# Patient Record
Sex: Female | Born: 1975 | Race: White | Hispanic: No | Marital: Single | Smoking: Current every day smoker
Health system: Southern US, Community
[De-identification: ages and names within clinical notes are randomized; demographics above are authoritative.]

## PROBLEM LIST (undated history)

## (undated) DIAGNOSIS — J069 Acute upper respiratory infection, unspecified: Secondary | ICD-10-CM

## (undated) DIAGNOSIS — F419 Anxiety disorder, unspecified: Secondary | ICD-10-CM

## (undated) DIAGNOSIS — R87629 Unspecified abnormal cytological findings in specimens from vagina: Secondary | ICD-10-CM

## (undated) DIAGNOSIS — J329 Chronic sinusitis, unspecified: Secondary | ICD-10-CM

## (undated) DIAGNOSIS — K219 Gastro-esophageal reflux disease without esophagitis: Secondary | ICD-10-CM

## (undated) DIAGNOSIS — N390 Urinary tract infection, site not specified: Secondary | ICD-10-CM

## (undated) DIAGNOSIS — N200 Calculus of kidney: Secondary | ICD-10-CM

## (undated) HISTORY — DX: Unspecified abnormal cytological findings in specimens from vagina: R87.629

## (undated) HISTORY — DX: Gastro-esophageal reflux disease without esophagitis: K21.9

## (undated) HISTORY — PX: TUBAL LIGATION: SHX77

## (undated) HISTORY — PX: DILATION AND CURETTAGE OF UTERUS: SHX78

---

## 2002-03-30 ENCOUNTER — Encounter: Payer: Self-pay | Admitting: Emergency Medicine

## 2002-03-30 ENCOUNTER — Emergency Department (HOSPITAL_COMMUNITY): Admission: EM | Admit: 2002-03-30 | Discharge: 2002-03-30 | Payer: Self-pay | Admitting: Emergency Medicine

## 2002-12-15 ENCOUNTER — Encounter: Admission: RE | Admit: 2002-12-15 | Discharge: 2002-12-15 | Payer: Self-pay | Admitting: Family Medicine

## 2003-05-30 ENCOUNTER — Emergency Department (HOSPITAL_COMMUNITY): Admission: EM | Admit: 2003-05-30 | Discharge: 2003-05-31 | Payer: Self-pay | Admitting: Emergency Medicine

## 2003-06-25 ENCOUNTER — Encounter: Payer: Self-pay | Admitting: Emergency Medicine

## 2003-06-26 ENCOUNTER — Inpatient Hospital Stay (HOSPITAL_COMMUNITY): Admission: AD | Admit: 2003-06-26 | Discharge: 2003-06-27 | Payer: Self-pay | Admitting: Obstetrics and Gynecology

## 2003-11-06 ENCOUNTER — Emergency Department (HOSPITAL_COMMUNITY): Admission: EM | Admit: 2003-11-06 | Discharge: 2003-11-06 | Payer: Self-pay | Admitting: Emergency Medicine

## 2003-11-13 ENCOUNTER — Inpatient Hospital Stay (HOSPITAL_COMMUNITY): Admission: AD | Admit: 2003-11-13 | Discharge: 2003-11-14 | Payer: Self-pay | Admitting: Obstetrics & Gynecology

## 2003-11-29 ENCOUNTER — Ambulatory Visit (HOSPITAL_COMMUNITY): Admission: AD | Admit: 2003-11-29 | Discharge: 2003-11-29 | Payer: Self-pay | Admitting: Obstetrics & Gynecology

## 2003-11-29 ENCOUNTER — Encounter (INDEPENDENT_AMBULATORY_CARE_PROVIDER_SITE_OTHER): Payer: Self-pay | Admitting: *Deleted

## 2004-06-10 ENCOUNTER — Emergency Department (HOSPITAL_COMMUNITY): Admission: EM | Admit: 2004-06-10 | Discharge: 2004-06-10 | Payer: Self-pay | Admitting: Emergency Medicine

## 2004-07-01 ENCOUNTER — Emergency Department (HOSPITAL_COMMUNITY): Admission: EM | Admit: 2004-07-01 | Discharge: 2004-07-01 | Payer: Self-pay | Admitting: Emergency Medicine

## 2006-03-10 ENCOUNTER — Emergency Department (HOSPITAL_COMMUNITY): Admission: EM | Admit: 2006-03-10 | Discharge: 2006-03-10 | Payer: Self-pay | Admitting: Emergency Medicine

## 2008-09-02 ENCOUNTER — Inpatient Hospital Stay (HOSPITAL_COMMUNITY): Admission: AD | Admit: 2008-09-02 | Discharge: 2008-09-02 | Payer: Self-pay | Admitting: Obstetrics & Gynecology

## 2010-06-24 ENCOUNTER — Ambulatory Visit: Payer: Self-pay | Admitting: Gynecology

## 2010-06-24 ENCOUNTER — Inpatient Hospital Stay (HOSPITAL_COMMUNITY): Admission: AD | Admit: 2010-06-24 | Discharge: 2010-06-24 | Payer: Self-pay | Admitting: Obstetrics & Gynecology

## 2010-08-01 ENCOUNTER — Ambulatory Visit (HOSPITAL_COMMUNITY): Admission: RE | Admit: 2010-08-01 | Discharge: 2010-08-01 | Payer: Self-pay | Admitting: Family Medicine

## 2010-10-03 ENCOUNTER — Ambulatory Visit (HOSPITAL_COMMUNITY): Admission: RE | Admit: 2010-10-03 | Discharge: 2010-10-03 | Payer: Self-pay | Admitting: Family Medicine

## 2011-01-19 ENCOUNTER — Encounter: Payer: Self-pay | Admitting: Family Medicine

## 2011-01-27 ENCOUNTER — Encounter: Payer: Self-pay | Admitting: Family Medicine

## 2011-01-27 ENCOUNTER — Inpatient Hospital Stay (HOSPITAL_COMMUNITY)
Admission: AD | Admit: 2011-01-27 | Discharge: 2011-01-27 | Payer: Self-pay | Source: Home / Self Care | Attending: Family Medicine | Admitting: Family Medicine

## 2011-01-27 LAB — URINALYSIS, ROUTINE W REFLEX MICROSCOPIC
Bilirubin Urine: NEGATIVE
Ketones, ur: NEGATIVE mg/dL
Leukocytes, UA: NEGATIVE
Nitrite: NEGATIVE
Protein, ur: NEGATIVE mg/dL
Specific Gravity, Urine: 1.025 (ref 1.005–1.030)
Urine Glucose, Fasting: NEGATIVE mg/dL
Urobilinogen, UA: 0.2 mg/dL (ref 0.0–1.0)
pH: 6 (ref 5.0–8.0)

## 2011-01-27 LAB — URINE MICROSCOPIC-ADD ON

## 2011-01-29 LAB — URINE CULTURE
Colony Count: >=100000
Culture  Setup Time: 201201310321

## 2011-02-24 ENCOUNTER — Other Ambulatory Visit: Payer: Self-pay | Admitting: Family Medicine

## 2011-02-24 DIAGNOSIS — Z8759 Personal history of other complications of pregnancy, childbirth and the puerperium: Secondary | ICD-10-CM

## 2011-02-25 ENCOUNTER — Inpatient Hospital Stay (HOSPITAL_COMMUNITY)
Admission: AD | Admit: 2011-02-25 | Discharge: 2011-02-27 | DRG: 775 | Disposition: A | Discharge status: Home / Self Care | Payer: Medicaid Other | Source: Ambulatory Visit | Attending: Obstetrics & Gynecology | Admitting: Obstetrics & Gynecology

## 2011-02-25 DIAGNOSIS — Z22330 Carrier of Group B streptococcus: Secondary | ICD-10-CM

## 2011-02-25 DIAGNOSIS — O4290 Premature rupture of membranes, unspecified as to length of time between rupture and onset of labor, unspecified weeks of gestation: Secondary | ICD-10-CM

## 2011-02-25 DIAGNOSIS — O99892 Other specified diseases and conditions complicating childbirth: Secondary | ICD-10-CM | POA: Diagnosis present

## 2011-02-25 DIAGNOSIS — O9989 Other specified diseases and conditions complicating pregnancy, childbirth and the puerperium: Secondary | ICD-10-CM

## 2011-02-25 LAB — CBC
HCT: 35.1 % — ABNORMAL LOW (ref 36.0–46.0)
Hemoglobin: 12.1 g/dL (ref 12.0–15.0)
MCH: 29.1 pg (ref 26.0–34.0)
MCHC: 34.5 g/dL (ref 30.0–36.0)
MCV: 84.4 fL (ref 78.0–100.0)
Platelets: 287 10*3/uL (ref 150–400)
RBC: 4.16 MIL/uL (ref 3.87–5.11)
RDW: 14 % (ref 11.5–15.5)
WBC: 12 10*3/uL — ABNORMAL HIGH (ref 4.0–10.5)

## 2011-02-25 LAB — RPR: RPR Ser Ql: NONREACTIVE

## 2011-02-26 LAB — CBC
HCT: 30.8 % — ABNORMAL LOW (ref 36.0–46.0)
Hemoglobin: 10.2 g/dL — ABNORMAL LOW (ref 12.0–15.0)
MCH: 28.1 pg (ref 26.0–34.0)
MCHC: 33.1 g/dL (ref 30.0–36.0)
MCV: 84.8 fL (ref 78.0–100.0)
Platelets: 257 10*3/uL (ref 150–400)
RBC: 3.63 MIL/uL — ABNORMAL LOW (ref 3.87–5.11)
RDW: 13.8 % (ref 11.5–15.5)
WBC: 12.9 10*3/uL — ABNORMAL HIGH (ref 4.0–10.5)

## 2011-02-27 ENCOUNTER — Ambulatory Visit (HOSPITAL_COMMUNITY): Payer: Medicaid Other

## 2011-03-16 LAB — POCT PREGNANCY, URINE: Preg Test, Ur: POSITIVE

## 2011-03-28 ENCOUNTER — Ambulatory Visit (INDEPENDENT_AMBULATORY_CARE_PROVIDER_SITE_OTHER): Payer: Medicaid Other | Admitting: Obstetrics & Gynecology

## 2011-03-28 DIAGNOSIS — Z3009 Encounter for other general counseling and advice on contraception: Secondary | ICD-10-CM

## 2011-03-28 NOTE — H&P (Signed)
NAME:  Jacqueline [name], BELITZ NO.:  [id number]  MEDICAL RECORD NO.:  [id number]           PATIENT TYPE:  A  LOCATION:  [facility]                   FACILITY:  [facility]  PHYSICIAN:  Scheryl Darter, MD       DATE OF BIRTH:  13-May-1976  DATE OF SERVICE:                          PRE-OP HISTORY & PHYSICAL  The patient wants to have sterilization procedure.  The patient is a 35- year-old white female gravida 4, para 2-1-1-3, who had a vaginal delivery of an 8-pound-11-ounce female at 16 weeks 3 days' gestation on February 25, 2011.  The patient desires sterilization, but this was not done during hospitalization due to timing of the procedure.  She now wants to schedule laparoscopic sterilization procedure.  She understands this is permanent sterilization, but there is a risk of failure or ectopic of about 1:200.  The risks of anesthesia, complications, bleeding, infection, bowel or urinary tract damage was also discussed. Questions were answered.  She wants to schedule this as an outpatient.  PAST MEDICAL HISTORY:  Anemia.  PAST GYNECOLOGIC HISTORY:  The patient had an ectopic pregnancy in 2006. She had a D and C, but no abdominal procedures.  She has had abnormal Pap smear with L-SIL that was investigated with a colposcopy during her pregnancy.  She is for followup Pap smears postpartum.  FAMILY HISTORY:  Cancer and heart disease.  SOCIAL HISTORY:  The patient smokes 2 cigarettes a day.  MEDICATIONS:  None.  ALLERGIES:  PENICILLIN which causes a rash.  REVIEW OF SYSTEMS:  No bleeding.  She received Depo-Provera in the hospital.  No complaints of abnormal discharge or pain.  PHYSICAL EXAMINATION:  GENERAL:  The patient is in no acute distress. Affect is normal. VITAL SIGNS:  Weight 149 pounds, height 60 inches, blood pressure 106/73, pulse 71, temperature 98.1. ABDOMEN:  Soft and nontender.  No mass. EXTERNAL GENITALIA:  Vagina and cervix are normal.  Uterus normal  in size.  No adnexal masses or tenderness.  IMPRESSION: 1. Undesired fertility. 2. History of low-grade squamous intraepithelial lesion Pap status     post colposcopy during pregnancy.  PLAN:  The patient will be scheduled for laparoscopic bilateral tubal ligation.  Medicaid papers were signed and are __________.     Scheryl Darter, MD    JA/MEDQ  D:  03/28/2011  T:  03/28/2011  Job:  [id number]

## 2011-04-28 ENCOUNTER — Ambulatory Visit (HOSPITAL_COMMUNITY)
Admission: RE | Admit: 2011-04-28 | Discharge: 2011-04-28 | Disposition: A | Discharge status: Home / Self Care | Payer: Medicaid Other | Source: Ambulatory Visit | Attending: Obstetrics & Gynecology | Admitting: Obstetrics & Gynecology

## 2011-04-28 DIAGNOSIS — Z302 Encounter for sterilization: Secondary | ICD-10-CM | POA: Insufficient documentation

## 2011-04-28 LAB — CBC
HCT: 38.1 % (ref 36.0–46.0)
MCH: 28.1 pg (ref 26.0–34.0)
MCV: 83.6 fL (ref 78.0–100.0)
Platelets: 341 10*3/uL (ref 150–400)
RBC: 4.56 MIL/uL (ref 3.87–5.11)

## 2011-04-28 LAB — PREGNANCY, URINE: Preg Test, Ur: NEGATIVE

## 2011-04-29 NOTE — Op Note (Signed)
  NAME:  Jacqueline [name], Jacqueline [name] NO.:  [id number]  MEDICAL RECORD NO.:  [id number]           PATIENT TYPE:  O  LOCATION:  [facility]                          [facility]  PHYSICIAN:  Scheryl Darter, MD       DATE OF BIRTH:  16-Jun-1976  DATE OF PROCEDURE: DATE OF DISCHARGE:                              OPERATIVE REPORT   PROCEDURE:  Laparoscopic bilateral tubal ligation.  PREOPERATIVE DIAGNOSIS:  Undesired fertility.  POSTOPERATIVE DIAGNOSIS:  Undesired fertility.  SURGEON:  Scheryl Darter, MD.  ANESTHESIA:  General.  ESTIMATED BLOOD LOSS:  Minimal.  SPECIMENS:  None.  COMPLICATIONS:  None.  COUNTS:  Correct.  OPERATIVE COURSE:  The patient gave written consent for laparoscopic tubal sterilization procedure.  The patient identification was confirmed.  She was brought to the OR and general anesthesia was induced.  She was placed in dorsal lithotomy position.  Abdomen, perineum and vagina were sterilely prepped and draped.  A Hulka tenaculum was placed on the cervix.  A #11 blade was used to make a transverse infraumbilical skin incision about 1.5 cm across.  Abdominal wall was elevated and Veress needle was placed.  CO2 was insufflated. Needle was then removed.  An 11-mm trocar was then placed in the incision.  Laparoscope was inserted with video camera in use.  View of the pelvis was obtained.  Uterus, tubes and ovaries appeared normal.  Uterus was manipulated with a Hulka tenaculum.  Filshie clip was applied to the right fallopian tube with good positioning seen. Same was done on the left side.  All instruments were removed and pneumoperitoneum was released.  A 0 Vicryl suture was placed in the fascia at the umbilicus.  Skin was closed with interrupted subcuticular sutures of 4-0 Vicryl and a sterile dressing was applied.  The patient tolerated the procedure well without complications.  Estimated blood loss was negligible.  She was brought in stable condition to  the recovery room.     Scheryl Darter, MD     JA/MEDQ  D:  04/28/2011  T:  04/29/2011  Job:  [id number]  Electronically Signed by Scheryl Darter MD on 04/29/2011 11:06:22 AM

## 2011-05-16 NOTE — Discharge Summary (Signed)
NAME:  Jacqueline [name], Jacqueline [name]                            ACCOUNT NO.:  [id number]   MEDICAL RECORD NO.:  [id number]                   PATIENT TYPE:  INP   LOCATION:  9125                                 [facility]   PHYSICIAN:  Zenaida Niece, M.D.             DATE OF BIRTH:  1976-10-10   DATE OF ADMISSION:  06/25/2003  DATE OF DISCHARGE:  06/27/2003                                 DISCHARGE SUMMARY   ADMISSION DIAGNOSES:  1. Early pregnancy.  2. Right pyelonephritis.  3. Vaginal Candidiasis.   PROCEDURES:  None.   HISTORY AND PHYSICAL:  This is a 35 year old white female, gravida 4, para 2-  0-1-2, with a last menstrual period at the beginning of June, who presented  to [facility] ED with the complaint of right flank pain and dysuria as well  as hematuria, nausea, vomiting, and constipation.  She denied fever.  The  lab findings at [facility] were consistent with the UTI and she was  transferred to [facility] for evaluation of __________, or early pregnancy and  pyelonephritis.   PAST OB HISTORY:  Two vaginal deliveries and one __________.   MEDICAL HISTORY:  History of pyelonephritis and UTI.   SOCIAL HISTORY:  She does smoke a half pack of cigarettes a day.   PHYSICAL EXAM:  She is afebrile.  She has stable vital signs.  GENERAL:  She is in mild distress.  LUNGS:  Clear to auscultation.  HEART:  Regular rate and rhythm without murmur.  BACK:  She has positive right CVA tenderness.  ABDOMEN:  Soft.  Mild tenderness in the right lower quadrant and right flank  without mass.   ADMISSION LABS:  Blood type is O positive.  CBC with a white count of 14.9,  hemoglobin13.5, platelets 114,000.  Quantitative hCG of 200.  Creatinine  0.7.  UA with too numerous to count white blood cells and red blood cells  with positive yeast.  Pelvic ultrasound with a normal pelvis.   HOSPITAL COURSE:  The patient was admitted due to the fact that she was  unable to tolerate p.o.  She was placed on  IV Ancef.  She remained afebrile.  She was unable to tolerate adequate p.o. until the evening of June 28th.  White count remained stable at 14.1, hemoglobin fell to 11.9 with hydration,  and platelet count increased to 293.  On the morning of June 29th, she had  remained afebrile and was able to tolerate p.o. and was felt stable enough  for discharge home.  Urine culture returned E. coli that was pansensitive.   DISCHARGE INSTRUCTIONS:  1. Regular diet.  2. Follow up in approximately 10 days.    MEDICATIONS:  1. Keflex 500 mg, #24, 1 p.o. t.i.d. for eight days.  She is also to use     five days of Terazol 7.  Zenaida Niece, M.D.    TDM/MEDQ  D:  06/27/2003  T:  06/27/2003  Job:  [id number]

## 2011-05-16 NOTE — Op Note (Signed)
NAME:  Jacqueline [name], Jacqueline [name]                           ACCOUNT NO.:  [id number]   MEDICAL RECORD NO.:  [id number]                   PATIENT TYPE:  AMB   LOCATION:  [facility]                                 [facility]   PHYSICIAN:  Phil D. Okey Dupre, M.D.                  DATE OF BIRTH:  Jul 03, 1976   DATE OF PROCEDURE:  11/29/2003  DATE OF DISCHARGE:                                 OPERATIVE REPORT   PREOPERATIVE DIAGNOSIS:  Missed abortion.   POSTOPERATIVE DIAGNOSIS:  Missed abortion.   PROCEDURE:  Dilatation and evacuation.   SURGEON:  Javier Glazier. Okey Dupre, M.D.   ASSISTANT:  Bradly Bienenstock, M.D.   ESTIMATED BLOOD LOSS:  10 mL.   ANESTHESIA:  MAC plus local.   FINDINGS:  Uterus anterior flexed, approximately 8-weeks size.  Freely  movable with normal free adnexa.   DESCRIPTION OF PROCEDURE:  Under satisfactory MAC sedation with the patient  in the dorsal lithotomy position, the perineum and vagina prepped and draped  in the usual sterile manner.  Bimanual pelvic examination was as  aforementioned.  Weighted speculum was placed to the posterior fourchette of  the vagina.  Anterior lip of the cervix was grasped with a single-tooth  tenaculum.  Paracervical block was done, injecting 10 mL of 1% Xylocaine in  each of two areas at 9 and 3 o'clock of the paracervical area.  Uterine  cavity was then sounded to 8 cm.  Cervical os dilated to a #8 Hegar dilator.  The uterine cavity evacuated with a #8 curved curette without incident.  Minimal bleeding during the procedure.  Tenaculum and speculum removed from  the vagina.  The patient transferred to the recovery room in satisfactory  condition having tolerated the procedure well.                                               Phil D. Okey Dupre, M.D.    PDR/MEDQ  D:  11/29/2003  T:  11/30/2003  Job:  [id number]

## 2011-10-01 LAB — URINALYSIS, ROUTINE W REFLEX MICROSCOPIC
Glucose, UA: NEGATIVE
Ketones, ur: NEGATIVE
Leukocytes, UA: NEGATIVE
Specific Gravity, Urine: 1.02
pH: 7

## 2011-10-01 LAB — CBC
HCT: 39.7
Hemoglobin: 13.1
MCHC: 33.1
MCV: 92.3
RBC: 4.3
WBC: 10.5

## 2011-10-01 LAB — URINE CULTURE

## 2011-10-01 LAB — WET PREP, GENITAL
Trich, Wet Prep: NONE SEEN
Yeast Wet Prep HPF POC: NONE SEEN

## 2011-10-01 LAB — URINE MICROSCOPIC-ADD ON: RBC / HPF: NONE SEEN

## 2011-10-01 LAB — GC/CHLAMYDIA PROBE AMP, GENITAL: Chlamydia, DNA Probe: NEGATIVE

## 2011-11-25 ENCOUNTER — Emergency Department (HOSPITAL_COMMUNITY)
Admission: EM | Admit: 2011-11-25 | Discharge: 2011-11-25 | Disposition: A | Discharge status: Home / Self Care | Payer: Self-pay | Attending: Emergency Medicine | Admitting: Emergency Medicine

## 2011-11-25 ENCOUNTER — Encounter: Payer: Self-pay | Admitting: Emergency Medicine

## 2011-11-25 DIAGNOSIS — R112 Nausea with vomiting, unspecified: Secondary | ICD-10-CM | POA: Insufficient documentation

## 2011-11-25 DIAGNOSIS — R197 Diarrhea, unspecified: Secondary | ICD-10-CM | POA: Insufficient documentation

## 2011-11-25 DIAGNOSIS — Z9851 Tubal ligation status: Secondary | ICD-10-CM | POA: Insufficient documentation

## 2011-11-25 DIAGNOSIS — F17200 Nicotine dependence, unspecified, uncomplicated: Secondary | ICD-10-CM | POA: Insufficient documentation

## 2011-11-25 LAB — COMPREHENSIVE METABOLIC PANEL
ALT: 10 U/L (ref 0–35)
AST: 13 U/L (ref 0–37)
Albumin: 4 g/dL (ref 3.5–5.2)
CO2: 26 mEq/L (ref 19–32)
Calcium: 8.8 mg/dL (ref 8.4–10.5)
Creatinine, Ser: 0.58 mg/dL (ref 0.50–1.10)
GFR calc non Af Amer: >90 mL/min (ref >90)
Sodium: 137 mEq/L (ref 135–145)

## 2011-11-25 LAB — URINE MICROSCOPIC-ADD ON

## 2011-11-25 LAB — URINALYSIS, ROUTINE W REFLEX MICROSCOPIC
Glucose, UA: NEGATIVE mg/dL
Leukocytes, UA: NEGATIVE
Protein, ur: 30 mg/dL — AB
Specific Gravity, Urine: >1.03 — ABNORMAL HIGH (ref 1.005–1.030)
pH: 6 (ref 5.0–8.0)

## 2011-11-25 LAB — DIFFERENTIAL
Basophils Absolute: 0 10*3/uL (ref 0.0–0.1)
Basophils Relative: 0 % (ref 0–1)
Eosinophils Relative: 0 % (ref 0–5)
Lymphocytes Relative: 14 % (ref 12–46)
Neutro Abs: 6.5 10*3/uL (ref 1.7–7.7)

## 2011-11-25 LAB — CBC
MCHC: 34 g/dL (ref 30.0–36.0)
MCV: 81.6 fL (ref 78.0–100.0)
Platelets: 288 10*3/uL (ref 150–400)
RDW: 14.7 % (ref 11.5–15.5)
WBC: 8.2 10*3/uL (ref 4.0–10.5)

## 2011-11-25 LAB — POCT PREGNANCY, URINE: Preg Test, Ur: NEGATIVE

## 2011-11-25 MED ORDER — POTASSIUM CHLORIDE 20 MEQ PO PACK
40.0000 meq | PACK | Freq: Once | ORAL | Status: AC
  Administered 2011-11-25: 40 meq via ORAL
  Filled 2011-11-25: qty 2

## 2011-11-25 MED ORDER — ONDANSETRON HCL 4 MG PO TABS: 4.0000 mg | ORAL_TABLET | Freq: Four times a day (QID) | ORAL | Status: AC | PRN

## 2011-11-25 MED ORDER — ONDANSETRON 8 MG PO TBDP
8.0000 mg | ORAL_TABLET | Freq: Once | ORAL | Status: AC
  Administered 2011-11-25: 8 mg via ORAL
  Filled 2011-11-25: qty 1

## 2011-11-25 MED ORDER — DICYCLOMINE HCL 20 MG PO TABS: 20.0000 mg | ORAL_TABLET | Freq: Four times a day (QID) | ORAL | Status: DC

## 2011-11-25 MED ORDER — DICYCLOMINE HCL 10 MG/ML IM SOLN
20.0000 mg | Freq: Once | INTRAMUSCULAR | Status: AC
  Administered 2011-11-25: 20 mg via INTRAMUSCULAR
  Filled 2011-11-25: qty 2

## 2011-11-25 NOTE — ED Notes (Signed)
Dr. Clarene Duke in prior to RN, see EDP assessment for further,

## 2011-11-25 NOTE — ED Notes (Signed)
Pt tolerating ginger ale 

## 2011-11-25 NOTE — ED Provider Notes (Signed)
History     CSN: [id number] Arrival date & time: 11/25/2011  1:08 PM  Chief Complaint  Patient presents with  . Emesis  . Diarrhea  . Abdominal Pain    HPI Pt was seen at 1330.  Per pt, c/o gradual onset and persistence of multiple intermittent episodes of N/V/D since last night.  Describes the diarrhea as "watery," has been assoc with generalized body aches/fatigue and abd "cramping."  +multiple others in household with same symptoms over the past several days.  Denies fevers, no black or blood in stools or emesis, no back pain, no rash, no abd pain, no CP/SOB.    History reviewed. No pertinent past medical history.  Past Surgical History  Procedure Date  . Tubal ligation     Family History  Problem Relation Age of Onset  . Diabetes Other     History  Substance Use Topics  . Smoking status: Current Everyday Smoker -- 0.5 packs/day for 4 years    Types: Cigarettes  . Smokeless tobacco: Not on file  . Alcohol Use: No    OB History    Grav Para Term Preterm Abortions TAB SAB Ect Mult Living   3 3 3       3       Review of Systems ROS: Statement: All systems negative except as marked or noted in the HPI; Constitutional: Negative for fever and chills. +generalized body aches/fatigue ; ; Eyes: Negative for eye pain, redness and discharge. ; ; ENMT: Negative for ear pain, hoarseness, nasal congestion, sinus pressure and sore throat. ; ; Cardiovascular: Negative for chest pain, palpitations, diaphoresis, dyspnea and peripheral edema. ; ; Respiratory: Negative for cough, wheezing and stridor. ; ; Gastrointestinal: Positive for nausea, vomiting, diarrhea; negative for abdominal pain, blood in stool, hematemesis, jaundice and rectal bleeding. . ; ; Genitourinary: Negative for dysuria, flank pain and hematuria. ; ; Musculoskeletal: Negative for back pain and neck pain. Negative for swelling and trauma.; ; Skin: Negative for pruritus, rash, abrasions, blisters, bruising and skin lesion.;  ; Neuro: Negative for headache, lightheadedness and neck stiffness. Negative for weakness, altered level of consciousness , altered mental status, extremity weakness, paresthesias, involuntary movement, seizure and syncope.      Allergies  Penicillins  Home Medications  No current outpatient prescriptions on file.  BP 136/90  Pulse 109  Temp(Src) 97.8 F (36.6 C) (Oral)  Resp 18  Ht 5' (1.524 m)  Wt 160 lb (72.576 kg)  BMI 31.25 kg/m2  SpO2 99%  LMP 11/25/2011  Physical Exam 1335: Physical examination:  Nursing notes reviewed; Vital signs and O2 SAT reviewed;  Constitutional: Well developed, Well nourished, Well hydrated, In no acute distress; Head:  Normocephalic, atraumatic; Eyes: EOMI, PERRL, No scleral icterus; ENMT: Mouth and pharynx normal, Mucous membranes moist; Neck: Supple, Full range of motion, No lymphadenopathy; Cardiovascular: Regular rate and rhythm, No murmur, rub, or gallop; Respiratory: Breath sounds clear & equal bilaterally, No rales, rhonchi, wheezes, or rub, Normal respiratory effort/excursion; Chest: Nontender, Movement normal; Abdomen: Soft, Nontender, Nondistended, Normal bowel sounds; Genitourinary: No CVA tenderness; Extremities: Pulses normal, No tenderness, No edema, No calf edema or asymmetry.; Neuro: AA&Ox3, Major CN grossly intact.  No gross focal motor or sensory deficits in extremities.; Skin: Color normal, Warm, Dry, no rash, no petechiae.    ED Course  Procedures   MDM  MDM Reviewed: nursing note and vitals Interpretation: labs   Results for orders placed during the hospital encounter of 11/25/11  CBC  Component Value Range   WBC 8.2  4.0 - 10.5 (K/uL)   RBC 5.26 (*) 3.87 - 5.11 (MIL/uL)   Hemoglobin 14.6  12.0 - 15.0 (g/dL)   HCT 16.1  09.6 - 04.5 (%)   MCV 81.6  78.0 - 100.0 (fL)   MCH 27.8  26.0 - 34.0 (pg)   MCHC 34.0  30.0 - 36.0 (g/dL)   RDW 40.9  81.1 - 91.4 (%)   Platelets 288  150 - 400 (K/uL)  DIFFERENTIAL       Component Value Range   Neutrophils Relative 80 (*) 43 - 77 (%)   Neutro Abs 6.5  1.7 - 7.7 (K/uL)   Lymphocytes Relative 14  12 - 46 (%)   Lymphs Abs 1.1  0.7 - 4.0 (K/uL)   Monocytes Relative 5  3 - 12 (%)   Monocytes Absolute 0.4  0.1 - 1.0 (K/uL)   Eosinophils Relative 0  0 - 5 (%)   Eosinophils Absolute 0.0  0.0 - 0.7 (K/uL)   Basophils Relative 0  0 - 1 (%)   Basophils Absolute 0.0  0.0 - 0.1 (K/uL)  COMPREHENSIVE METABOLIC PANEL      Component Value Range   Sodium 137  135 - 145 (mEq/L)   Potassium 3.1 (*) 3.5 - 5.1 (mEq/L)   Chloride 102  96 - 112 (mEq/L)   CO2 26  19 - 32 (mEq/L)   Glucose, Bld 100 (*) 70 - 99 (mg/dL)   BUN 10  6 - 23 (mg/dL)   Creatinine, Ser 7.82  0.50 - 1.10 (mg/dL)   Calcium 8.8  8.4 - 95.6 (mg/dL)   Total Protein 7.5  6.0 - 8.3 (g/dL)   Albumin 4.0  3.5 - 5.2 (g/dL)   AST 13  0 - 37 (U/L)   ALT 10  0 - 35 (U/L)   Alkaline Phosphatase 65  39 - 117 (U/L)   Total Bilirubin 0.9  0.3 - 1.2 (mg/dL)   GFR calc non Af Amer >90  >90 (mL/min)   GFR calc Af Amer >90  >90 (mL/min)  LIPASE, BLOOD      Component Value Range   Lipase 20  11 - 59 (U/L)  URINALYSIS, ROUTINE W REFLEX MICROSCOPIC      Component Value Range   Color, Urine YELLOW  YELLOW    Appearance HAZY (*) CLEAR    Specific Gravity, Urine >1.030 (*) 1.005 - 1.030    pH 6.0  5.0 - 8.0    Glucose, UA NEGATIVE  NEGATIVE (mg/dL)   Hgb urine dipstick LARGE (*) NEGATIVE    Bilirubin Urine SMALL (*) NEGATIVE    Ketones, ur TRACE (*) NEGATIVE (mg/dL)   Protein, ur 30 (*) NEGATIVE (mg/dL)   Urobilinogen, UA 0.2  0.0 - 1.0 (mg/dL)   Nitrite NEGATIVE  NEGATIVE    Leukocytes, UA NEGATIVE  NEGATIVE   POCT PREGNANCY, URINE      Component Value Range   Preg Test, Ur NEGATIVE    URINE MICROSCOPIC-ADD ON      Component Value Range   Squamous Epithelial / LPF MANY (*) RARE    WBC, UA 3-6  <3 (WBC/hpf)   RBC / HPF TOO NUMEROUS TO COUNT  <3 (RBC/hpf)   Bacteria, UA FEW (*) RARE    Urine-Other MUCOUS  PRESENT      4:18 PM:  Has menses currently, UA contaminated.  Potassium repleted PO.  Has tol PO well while in ED.  No further N/V/D while  in ED.  States she feels "great now" and wants to go home.  Dx testing d/w pt.  Questions answered.  Verb understanding, agreeable to d/c home with outpt f/u.      Salam Micucci Allison Quarry, DO 11/27/11 [phone]

## 2011-11-25 NOTE — ED Notes (Signed)
Patient c/o nausea, vomiting, diarrhea, and generalized body aches that started last night. Per patient intermittent abd cramping.

## 2011-12-03 ENCOUNTER — Emergency Department (HOSPITAL_COMMUNITY)
Admission: EM | Admit: 2011-12-03 | Discharge: 2011-12-03 | Disposition: A | Discharge status: Home / Self Care | Payer: Self-pay | Attending: Emergency Medicine | Admitting: Emergency Medicine

## 2011-12-03 ENCOUNTER — Encounter (HOSPITAL_COMMUNITY): Payer: Self-pay | Admitting: Emergency Medicine

## 2011-12-03 DIAGNOSIS — F17200 Nicotine dependence, unspecified, uncomplicated: Secondary | ICD-10-CM | POA: Insufficient documentation

## 2011-12-03 DIAGNOSIS — J069 Acute upper respiratory infection, unspecified: Secondary | ICD-10-CM | POA: Insufficient documentation

## 2011-12-03 MED ORDER — PROMETHAZINE-CODEINE 6.25-10 MG/5ML PO SYRP: 5.0000 mL | ORAL_SOLUTION | ORAL | Status: AC | PRN

## 2011-12-03 MED ORDER — IBUPROFEN 800 MG PO TABS
800.0000 mg | ORAL_TABLET | Freq: Once | ORAL | Status: AC
  Administered 2011-12-03: 800 mg via ORAL
  Filled 2011-12-03: qty 1

## 2011-12-03 MED ORDER — OXYMETAZOLINE HCL 0.05 % NA SOLN
1.0000 | Freq: Once | NASAL | Status: AC
  Administered 2011-12-03: 1 via NASAL
  Filled 2011-12-03: qty 15

## 2011-12-03 NOTE — ED Notes (Signed)
Pt c/o sore throat, body aches and fever since yesterday.

## 2011-12-03 NOTE — ED Notes (Signed)
Pt states has generalized body aches , denies fever and cough at this time, has sore throat all starting last night.  Pt states had " stomach bug last Tuesday".  Pt denies N/V at this time also

## 2011-12-03 NOTE — ED Provider Notes (Signed)
History     CSN: [id number] Arrival date & time: 12/03/2011 10:20 AM   None     Chief Complaint  Patient presents with  . Generalized Body Aches  . Fever  . Sore Throat    (Consider location/radiation/quality/duration/timing/severity/associated sxs/prior treatment) Patient is a 35 y.o. female presenting with fever and pharyngitis. The history is provided by the patient.  Fever Primary symptoms of the febrile illness include fever and myalgias. Primary symptoms do not include cough, wheezing, shortness of breath, abdominal pain, dysuria or arthralgias. The current episode started yesterday. This is a new problem.  Associated with: None. Risk factors: sick contact at work. Sore Throat Associated symptoms include a fever and myalgias. Pertinent negatives include no abdominal pain, arthralgias, chest pain, coughing or neck pain.    History reviewed. No pertinent past medical history.  Past Surgical History  Procedure Date  . Tubal ligation     Family History  Problem Relation Age of Onset  . Diabetes Other     History  Substance Use Topics  . Smoking status: Current Everyday Smoker -- 0.5 packs/day for 4 years    Types: Cigarettes  . Smokeless tobacco: Not on file  . Alcohol Use: No    OB History    Grav Para Term Preterm Abortions TAB SAB Ect Mult Living   3 3 3       3       Review of Systems  Constitutional: Positive for fever. Negative for activity change.       All ROS Neg except as noted in HPI  HENT: Positive for postnasal drip. Negative for nosebleeds and neck pain.   Eyes: Negative for photophobia and discharge.  Respiratory: Negative for cough, shortness of breath and wheezing.   Cardiovascular: Negative for chest pain and palpitations.  Gastrointestinal: Negative for abdominal pain and blood in stool.  Genitourinary: Negative for dysuria, frequency and hematuria.  Musculoskeletal: Positive for myalgias. Negative for back pain and arthralgias.  Skin:  Negative.   Neurological: Negative for dizziness, seizures and speech difficulty.  Psychiatric/Behavioral: Negative for hallucinations and confusion.    Allergies  Penicillins  Home Medications   Current Outpatient Rx  Name Route Sig Dispense Refill  . BISMUTH SUBSALICYLATE 262 MG/15ML PO SUSP Oral Take 15 mLs by mouth 2 (two) times daily as needed. For stomach upset     . DICYCLOMINE HCL 20 MG PO TABS Oral Take 1 tablet (20 mg total) by mouth every 6 (six) hours. 8 tablet 0  . ONDANSETRON HCL 4 MG PO TABS Oral Take 1 tablet (4 mg total) by mouth every 6 (six) hours as needed for nausea. 6 tablet 0    BP 127/82  Pulse 88  Temp(Src) 98.9 F (37.2 C) (Oral)  Resp 18  Ht 5' (1.524 m)  Wt 160 lb (72.576 kg)  BMI 31.25 kg/m2  SpO2 99%  LMP 11/25/2011  Physical Exam  Nursing note and vitals reviewed. Constitutional: She is oriented to person, place, and time. She appears well-developed and well-nourished.  Non-toxic appearance.  HENT:  Head: Normocephalic.  Right Ear: Tympanic membrane and external ear normal.  Left Ear: Tympanic membrane and external ear normal.       Nasal congestion. Uvula enlarged.  Eyes: EOM and lids are normal. Pupils are equal, round, and reactive to light.  Neck: Normal range of motion. Neck supple. Carotid bruit is not present.  Cardiovascular: Normal rate, regular rhythm, normal heart sounds, intact distal pulses and normal pulses.  Pulmonary/Chest: Breath sounds normal. No respiratory distress.  Abdominal: Soft. Bowel sounds are normal. There is no tenderness. There is no guarding.  Musculoskeletal: Normal range of motion.  Lymphadenopathy:       Head (right side): No submandibular adenopathy present.       Head (left side): No submandibular adenopathy present.    She has no cervical adenopathy.  Neurological: She is alert and oriented to person, place, and time. She has normal strength. No cranial nerve deficit or sensory deficit.  Skin: Skin is  warm and dry.  Psychiatric: She has a normal mood and affect. Her speech is normal.    ED Course  Procedures (including critical care time)   Labs Reviewed  RAPID STREP SCREEN   No results found.   Dx: URI   MDM  I have reviewed nursing notes, vital signs, and all appropriate lab and imaging results for this patient. Pt has not had flu shot. Suspect early flu. Flu precautions suggested.  Results for orders placed during the hospital encounter of 12/03/11  RAPID STREP SCREEN      Component Value Range   Streptococcus, Group A Screen (Direct) NEGATIVE  NEGATIVE    No results found.       Kathie Dike, PA 12/03/11 [address] [place], [place] 12/03/11 1723

## 2011-12-05 NOTE — ED Provider Notes (Signed)
Medical screening examination/treatment/procedure(s) were performed by non-physician practitioner and as supervising physician I was immediately available for consultation/collaboration.  Nicoletta Dress. Colon Branch, MD 12/05/11 1120

## 2012-02-11 ENCOUNTER — Emergency Department (HOSPITAL_COMMUNITY)
Admission: EM | Admit: 2012-02-11 | Discharge: 2012-02-11 | Disposition: A | Discharge status: Home / Self Care | Payer: Self-pay | Attending: Emergency Medicine | Admitting: Emergency Medicine

## 2012-02-11 ENCOUNTER — Encounter (HOSPITAL_COMMUNITY): Payer: Self-pay

## 2012-02-11 DIAGNOSIS — K529 Noninfective gastroenteritis and colitis, unspecified: Secondary | ICD-10-CM

## 2012-02-11 DIAGNOSIS — K5289 Other specified noninfective gastroenteritis and colitis: Secondary | ICD-10-CM | POA: Insufficient documentation

## 2012-02-11 DIAGNOSIS — R197 Diarrhea, unspecified: Secondary | ICD-10-CM | POA: Insufficient documentation

## 2012-02-11 DIAGNOSIS — F17200 Nicotine dependence, unspecified, uncomplicated: Secondary | ICD-10-CM | POA: Insufficient documentation

## 2012-02-11 DIAGNOSIS — R112 Nausea with vomiting, unspecified: Secondary | ICD-10-CM | POA: Insufficient documentation

## 2012-02-11 LAB — DIFFERENTIAL
Basophils Absolute: 0 10*3/uL (ref 0.0–0.1)
Eosinophils Absolute: 0 10*3/uL (ref 0.0–0.7)
Eosinophils Relative: 0 % (ref 0–5)
Lymphocytes Relative: 15 % (ref 12–46)

## 2012-02-11 LAB — URINALYSIS, ROUTINE W REFLEX MICROSCOPIC
Leukocytes, UA: NEGATIVE
Protein, ur: NEGATIVE mg/dL
Urobilinogen, UA: 1 mg/dL (ref 0.0–1.0)

## 2012-02-11 LAB — CBC
HCT: 39.6 % (ref 36.0–46.0)
Hemoglobin: 13.8 g/dL (ref 12.0–15.0)
MCHC: 34.8 g/dL (ref 30.0–36.0)
RBC: 4.97 MIL/uL (ref 3.87–5.11)

## 2012-02-11 LAB — BASIC METABOLIC PANEL
Calcium: 9.4 mg/dL (ref 8.4–10.5)
GFR calc Af Amer: >90 mL/min (ref >90)
GFR calc non Af Amer: >90 mL/min (ref >90)
Sodium: 139 mEq/L (ref 135–145)

## 2012-02-11 LAB — URINE MICROSCOPIC-ADD ON

## 2012-02-11 MED ORDER — SODIUM CHLORIDE 0.9 % IV SOLN
Freq: Once | INTRAVENOUS | Status: AC
  Administered 2012-02-11: 08:00:00 via INTRAVENOUS

## 2012-02-11 MED ORDER — LOPERAMIDE HCL 2 MG PO CAPS
4.0000 mg | ORAL_CAPSULE | Freq: Once | ORAL | Status: AC
  Administered 2012-02-11: 4 mg via ORAL
  Filled 2012-02-11: qty 2

## 2012-02-11 MED ORDER — METOCLOPRAMIDE HCL 10 MG PO TABS: 10.0000 mg | ORAL_TABLET | Freq: Four times a day (QID) | ORAL | Status: AC | PRN

## 2012-02-11 MED ORDER — SODIUM CHLORIDE 0.9 % IV BOLUS (SEPSIS)
1000.0000 mL | Freq: Once | INTRAVENOUS | Status: AC
  Administered 2012-02-11: 1000 mL via INTRAVENOUS

## 2012-02-11 MED ORDER — ONDANSETRON HCL 4 MG/2ML IJ SOLN
4.0000 mg | Freq: Once | INTRAMUSCULAR | Status: AC
  Administered 2012-02-11: 4 mg via INTRAVENOUS
  Filled 2012-02-11: qty 2

## 2012-02-11 NOTE — Discharge Instructions (Signed)
Take Imodium AD as needed for diarrhea.  Metoclopramide tablets What is this medicine? METOCLOPRAMIDE (met oh kloe PRA mide) is used to treat the symptoms of gastroesophageal reflux disease (GERD) like heartburn. It is also used to treat people with slow emptying of the stomach and intestinal tract. This medicine may be used for other purposes; ask your health care provider or pharmacist if you have questions. What should I tell my health care provider before I take this medicine? They need to know if you have any of these conditions: -breast cancer -depression -diabetes -heart failure -high blood pressure -kidney disease -liver disease -Parkinson's disease or a movement disorder -pheochromocytoma -seizures -stomach obstruction, bleeding, or perforation -an unusual or allergic reaction to metoclopramide, procainamide, sulfites, other medicines, foods, dyes, or preservatives -pregnant or trying to get pregnant -breast-feeding How should I use this medicine? Take this medicine by mouth with a glass of water. Follow the directions on the prescription label. Take this medicine on an empty stomach, about 30 minutes before eating. Take your doses at regular intervals. Do not take your medicine more often than directed. Do not stop taking except on the advice of your doctor or health care professional. A special MedGuide will be given to you by the pharmacist with each prescription and refill. Be sure to read this information carefully each time. Talk to your pediatrician regarding the use of this medicine in children. Special care may be needed. Overdosage: If you think you have taken too much of this medicine contact a poison control center or emergency room at once. NOTE: This medicine is only for you. Do not share this medicine with others. What if I miss a dose? If you miss a dose, take it as soon as you can. If it is almost time for your next dose, take only that dose. Do not take double  or extra doses. What may interact with this medicine? -acetaminophen -cyclosporine -digoxin -medicines for blood pressure -medicines for diabetes, including insulin -medicines for hay fever and other allergies -medicines for depression, especially an Monoamine Oxidase Inhibitor (MAOI) -medicines for Parkinson's disease, like levodopa -medicines for sleep or for pain -tetracycline This list may not describe all possible interactions. Give your health care provider a list of all the medicines, herbs, non-prescription drugs, or dietary supplements you use. Also tell them if you smoke, drink alcohol, or use illegal drugs. Some items may interact with your medicine. What should I watch for while using this medicine? It may take a few weeks for your stomach condition to start to get better. However, do not take this medicine for longer than 12 weeks. The longer you take this medicine, and the more you take it, the greater your chances are of developing serious side effects. If you are an elderly patient, a female patient, or you have diabetes, you may be at an increased risk for side effects from this medicine. Contact your doctor immediately if you start having movements you cannot control such as lip smacking, rapid movements of the tongue, involuntary or uncontrollable movements of the eyes, head, arms and legs, or muscle twitches and spasms. Patients and their families should watch out for worsening depression or thoughts of suicide. Also watch out for any sudden or severe changes in feelings such as feeling anxious, agitated, panicky, irritable, hostile, aggressive, impulsive, severely restless, overly excited and hyperactive, or not being able to sleep. If this happens, especially at the beginning of treatment or after a change in dose, call your doctor.  Do not treat yourself for high fever. Ask your doctor or health care professional for advice. You may get drowsy or dizzy. Do not drive, use  machinery, or do anything that needs mental alertness until you know how this drug affects you. Do not stand or sit up quickly, especially if you are an older patient. This reduces the risk of dizzy or fainting spells. Alcohol can make you more drowsy and dizzy. Avoid alcoholic drinks. What side effects may I notice from receiving this medicine? Side effects that you should report to your doctor or health care professional as soon as possible: -allergic reactions like skin rash, itching or hives, swelling of the face, lips, or tongue -abnormal production of milk in females -breast enlargement in both males and females -change in the way you walk -difficulty moving, speaking or swallowing -drooling, lip smacking, or rapid movements of the tongue -excessive sweating -fever -involuntary or uncontrollable movements of the eyes, head, arms and legs -irregular heartbeat or palpitations -muscle twitches and spasms -unusually weak or tired Side effects that usually do not require medical attention (report to your doctor or health care professional if they continue or are bothersome): -change in sex drive or performance -depressed mood -diarrhea -difficulty sleeping -headache -menstrual changes -restless or nervous This list may not describe all possible side effects. Call your doctor for medical advice about side effects. You may report side effects to FDA at 1-800-FDA-1088. Where should I keep my medicine? Keep out of the reach of children. Store at room temperature between 20 and 25 degrees C (68 and 77 degrees F). Protect from light. Keep container tightly closed. Throw away any unused medicine after the expiration date. NOTE: This sheet is a summary. It may not cover all possible information. If you have questions about this medicine, talk to your doctor, pharmacist, or health care provider.  2012, [employer]. (08/09/2008 4:30:05 PM)  Diarrhea Infections caused by germs  (bacterial) or a virus commonly cause diarrhea. Your caregiver has determined that with time, rest and fluids, the diarrhea should improve. In general, eat normally while drinking more water than usual. Although water may prevent dehydration, it does not contain salt and minerals (electrolytes). Broths, weak tea without caffeine and oral rehydration solutions (ORS) replace fluids and electrolytes. Small amounts of fluids should be taken frequently. Large amounts at one time may not be tolerated. Plain water may be harmful in infants and the elderly. Oral rehydrating solutions (ORS) are available at pharmacies and grocery stores. ORS replace water and important electrolytes in proper proportions. Sports drinks are not as effective as ORS and may be harmful due to sugars worsening diarrhea.  ORS is especially recommended for use in children with diarrhea. As a general guideline for children, replace any new fluid losses from diarrhea and/or vomiting with ORS as follows:   If your child weighs 22 pounds or under (10 kg or less), give 60-120 mL ( -  cup or 2 - 4 ounces) of ORS for each episode of diarrheal stool or vomiting episode.   If your child weighs more than 22 pounds (more than 10 kgs), give 120-240 mL ( - 1 cup or 4 - 8 ounces) of ORS for each diarrheal stool or episode of vomiting.   While correcting for dehydration, children should eat normally. However, foods high in sugar should be avoided because this may worsen diarrhea. Large amounts of carbonated soft drinks, juice, gelatin desserts and other highly sugared drinks should be avoided.   After  correction of dehydration, other liquids that are appealing to the child may be added. Children should drink small amounts of fluids frequently and fluids should be increased as tolerated. Children should drink enough fluids to keep urine clear or pale yellow.   Adults should eat normally while drinking more fluids than usual. Drink small amounts of  fluids frequently and increase as tolerated. Drink enough fluids to keep urine clear or pale yellow. Broths, weak decaffeinated tea, lemon lime soft drinks (allowed to go flat) and ORS replace fluids and electrolytes.   Avoid:   Carbonated drinks.   Juice.   Extremely hot or cold fluids.   Caffeine drinks.   Fatty, greasy foods.   Alcohol.   Tobacco.   Too much intake of anything at one time.   Gelatin desserts.   Probiotics are active cultures of beneficial bacteria. They may lessen the amount and number of diarrheal stools in adults. Probiotics can be found in yogurt with active cultures and in supplements.   Wash hands well to avoid spreading bacteria and virus.   Anti-diarrheal medications are not recommended for infants and children.   Only take over-the-counter or prescription medicines for pain, discomfort or fever as directed by your caregiver. Do not give aspirin to children because it may cause Reye's Syndrome.   For adults, ask your caregiver if you should continue all prescribed and over-the-counter medicines.   If your caregiver has given you a follow-up appointment, it is very important to keep that appointment. Not keeping the appointment could result in a chronic or permanent injury, and disability. If there is any problem keeping the appointment, you must call back to this facility for assistance.  SEEK IMMEDIATE MEDICAL CARE IF:   You or your child is unable to keep fluids down or other symptoms or problems become worse in spite of treatment.   Vomiting or diarrhea develops and becomes persistent.   There is vomiting of blood or bile (green material).   There is blood in the stool or the stools are black and tarry.   There is no urine output in 6-8 hours or there is only a small amount of very dark urine.   Abdominal pain develops, increases or localizes.   You have a fever.   Your baby is older than 3 months with a rectal temperature of 102 F  (38.9 C) or higher.   Your baby is 44 months old or younger with a rectal temperature of 100.4 F (38 C) or higher.   You or your child develops excessive weakness, dizziness, fainting or extreme thirst.   You or your child develops a rash, stiff neck, severe headache or become irritable or sleepy and difficult to awaken.  MAKE SURE YOU:   Understand these instructions.   Will watch your condition.   Will get help right away if you are not doing well or get worse.  Document Released: 12/05/2002 Document Revised: 08/27/2011 Document Reviewed: 10/22/2009 [facility] Patient Information 2012 [place], [place].  Nausea and Vomiting Nausea is a sick feeling that often comes before throwing up (vomiting). Vomiting is a reflex where stomach contents come out of your mouth. Vomiting can cause severe loss of body fluids (dehydration). Children and elderly adults can become dehydrated quickly, especially if they also have diarrhea. Nausea and vomiting are symptoms of a condition or disease. It is important to find the cause of your symptoms. CAUSES   Direct irritation of the stomach lining. This irritation can result from increased acid  production (gastroesophageal reflux disease), infection, food poisoning, taking certain medicines (such as nonsteroidal anti-inflammatory drugs), alcohol use, or tobacco use.   Signals from the brain.These signals could be caused by a headache, heat exposure, an inner ear disturbance, increased pressure in the brain from injury, infection, a tumor, or a concussion, pain, emotional stimulus, or metabolic problems.   An obstruction in the gastrointestinal tract (bowel obstruction).   Illnesses such as diabetes, hepatitis, gallbladder problems, appendicitis, kidney problems, cancer, sepsis, atypical symptoms of a heart attack, or eating disorders.   Medical treatments such as chemotherapy and radiation.   Receiving medicine that makes you sleep (general anesthetic)  during surgery.  DIAGNOSIS Your caregiver may ask for tests to be done if the problems do not improve after a few days. Tests may also be done if symptoms are severe or if the reason for the nausea and vomiting is not clear. Tests may include:  Urine tests.   Blood tests.   Stool tests.   Cultures (to look for evidence of infection).   X-rays or other imaging studies.  Test results can help your caregiver make decisions about treatment or the need for additional tests. TREATMENT You need to stay well hydrated. Drink frequently but in small amounts.You may wish to drink water, sports drinks, clear broth, or eat frozen ice pops or gelatin dessert to help stay hydrated.When you eat, eating slowly may help prevent nausea.There are also some antinausea medicines that may help prevent nausea. HOME CARE INSTRUCTIONS   Take all medicine as directed by your caregiver.   If you do not have an appetite, do not force yourself to eat. However, you must continue to drink fluids.   If you have an appetite, eat a normal diet unless your caregiver tells you differently.   Eat a variety of complex carbohydrates (rice, wheat, potatoes, bread), lean meats, yogurt, fruits, and vegetables.   Avoid high-fat foods because they are more difficult to digest.   Drink enough water and fluids to keep your urine clear or pale yellow.   If you are dehydrated, ask your caregiver for specific rehydration instructions. Signs of dehydration may include:   Severe thirst.   Dry lips and mouth.   Dizziness.   Dark urine.   Decreasing urine frequency and amount.   Confusion.   Rapid breathing or pulse.  SEEK IMMEDIATE MEDICAL CARE IF:   You have blood or brown flecks (like coffee grounds) in your vomit.   You have black or bloody stools.   You have a severe headache or stiff neck.   You are confused.   You have severe abdominal pain.   You have chest pain or trouble breathing.   You do not  urinate at least once every 8 hours.   You develop cold or clammy skin.   You continue to vomit for longer than 24 to 48 hours.   You have a fever.  MAKE SURE YOU:   Understand these instructions.   Will watch your condition.   Will get help right away if you are not doing well or get worse.  Document Released: 12/15/2005 Document Revised: 08/27/2011 Document Reviewed: 05/14/2011 [facility] Patient Information 2012 [place], [place].  Smoking Hazards Smoking cigarettes is extremely bad for your health. Tobacco smoke has over 200 known poisons in it. There are over 60 chemicals in tobacco smoke that cause cancer. Some of the chemicals found in cigarette smoke include:   Cyanide.   Benzene.   Formaldehyde.   Methanol (  wood alcohol).   Acetylene (fuel used in welding torches).   Ammonia.  Cigarette smoke also contains the poisonous gases nitrogen oxide and carbon monoxide.  Cigarette smokers have an increased risk of many serious medical problems, including:  Lung cancer.   Lung disease (such as pneumonia, bronchitis, and emphysema).   Heart attack and chest pain due to the heart not getting enough oxygen (angina).   Heart disease and peripheral blood vessel disease.   Hypertension.   Stroke.   Oral cancer (cancer of the lip, mouth, or voice box).   Bladder cancer.   Pancreatic cancer.   Cervical cancer.   Pregnancy complications, including premature birth.   Low birthweight babies.   Early menopause.   Lower estrogen level for women.   Infertility.   Facial wrinkles.   Blindness.   Increased risk of broken bones (fractures).   Senile dementia.   Stillbirths and smaller newborn babies, birth defects, and genetic damage to sperm.   Stomach ulcers and internal bleeding.  Children of smokers have an increased risk of the following, because of secondhand smoke exposure:   Sudden infant death syndrome (SIDS).   Respiratory infections.   Lung  cancer.   Heart disease.   Ear infections.  Smoking causes approximately:  90% of all lung cancer deaths in men.   80% of all lung cancer deaths in women.   90% of deaths from chronic obstructive lung disease.  Compared with nonsmokers, smoking increases the risk of:  Coronary heart disease by 2 to 4 times.   Stroke by 2 to 4 times.   Men developing lung cancer by 23 times.   Women developing lung cancer by 13 times.   Dying from chronic obstructive lung diseases by 12 times.  Someone who smokes 2 packs a day loses about 8 years of his or her life. Even smoking lightly shortens your life expectancy by several years. You can greatly reduce the risk of medical problems for you and your family by stopping now. Smoking is the most preventable cause of death and disease in our society. Within days of quitting smoking, your circulation returns to normal, you decrease the risk of having a heart attack, and your lung capacity improves. There may be some increased phlegm in the first few days after quitting, and it may take months for your lungs to clear up completely. Quitting for 10 years cuts your lung cancer risk to almost that of a nonsmoker. WHY IS SMOKING ADDICTIVE?  Nicotine is the chemical agent in tobacco that is capable of causing addiction or dependence.   When you smoke and inhale, nicotine is absorbed rapidly into the bloodstream through your lungs. Nicotine absorbed through the lungs is capable of creating a powerful addiction. Both inhaled and non-inhaled nicotine may be addictive.   Addiction studies of cigarettes and spit tobacco show that addiction to nicotine occurs mainly during the teen years, when young people begin using tobacco products.  WHAT ARE THE BENEFITS OF QUITTING?  There are many health benefits to quitting smoking.   Likelihood of developing cancer and heart disease decreases. Health improvements are seen almost immediately.   Blood pressure, pulse rate,  and breathing patterns start returning to normal soon after quitting.   People who quit may see an improvement in their overall quality of life.  Some people choose to quit all at once. Other options include nicotine replacement products, such as patches, gum, and nasal sprays. Do not use these products without first checking  with your caregiver. QUITTING SMOKING It is not easy to quit smoking. Nicotine is addicting, and longtime habits are hard to change. To start, you can write down all your reasons for quitting, tell your family and friends you want to quit, and ask for their help. Throw your cigarettes away, chew gum or cinnamon sticks, keep your hands busy, and drink extra water or juice. Go for walks and practice deep breathing to relax. Think of all the money you are saving: around $1,000 a year, for the average pack-a-day smoker. Nicotine patches and gum have been shown to improve success at efforts to stop smoking. Zyban (bupropion) is an anti-depressant drug that can be prescribed to reduce nicotine withdrawal symptoms and to suppress the urge to smoke. Smoking is an addiction with both physical and psychological effects. Joining a stop-smoking support group can help you cope with the emotional issues. For more information and advice on programs to stop smoking, call your doctor, your local hospital, or these organizations:  American Lung Association - 1-800-LUNGUSA   American Cancer Society - 1-800-ACS-2345  Document Released: 01/22/2005 Document Revised: 08/27/2011 Document Reviewed: 09/26/2009 [facility] Patient Information 2012 [place], [place].

## 2012-02-11 NOTE — ED Provider Notes (Addendum)
History    This chart was scribed for Jacqueline Booze, MD, MD by Smitty Pluck. The patient was seen in room APA19 and the patient's care was started at 7:06AM.   CSN: [id number]  Arrival date & time 02/11/12  0646   None     Chief Complaint  Patient presents with  . Emesis  . Diarrhea    (Consider location/radiation/quality/duration/timing/severity/associated sxs/prior treatment) Patient is a 36 y.o. female presenting with vomiting and diarrhea. The history is provided by the patient.  Emesis  Associated symptoms include diarrhea.  Diarrhea The primary symptoms include vomiting and diarrhea.   Jacqueline [name] is a 36 y.o. female who presents to the Emergency Department complaining of moderate nausea onset 1 day ago at 9 am. Pt reports symptoms worsening later in the day to cramping with pain rated as 7/10 along with vomiting and diarrhea. Symptoms have been constant since onset. Pt reports having chills and sweats. She states that she was unable to eat or drink once symptoms started. She has not had any sick contact. She doesn't recall eating anything unusual within the past 2 days. Symptoms are described as severe. Nothing makes it better nothing makes it worse. She denies sick contacts. Pt smokes 0.5 packs/day. Denies any other major health problems.  No PCP.   History reviewed. No pertinent past medical history.  Past Surgical History  Procedure Date  . Tubal ligation     Family History  Problem Relation Age of Onset  . Diabetes Other     History  Substance Use Topics  . Smoking status: Current Everyday Smoker -- 0.5 packs/day for 4 years    Types: Cigarettes  . Smokeless tobacco: Not on file  . Alcohol Use: No    OB History    Grav Para Term Preterm Abortions TAB SAB Ect Mult Living   3 3 3       3       Review of Systems  Gastrointestinal: Positive for vomiting and diarrhea.  All other systems reviewed and are negative.  10 Systems reviewed and are negative for  acute change except as noted in the HPI.   Allergies  Penicillins  Home Medications   Current Outpatient Rx  Name Route Sig Dispense Refill  . BISMUTH SUBSALICYLATE 262 MG/15ML PO SUSP Oral Take 15 mLs by mouth 2 (two) times daily as needed. For stomach upset     . DICYCLOMINE HCL 20 MG PO TABS Oral Take 1 tablet (20 mg total) by mouth every 6 (six) hours. 8 tablet 0    BP 128/89  Pulse 103  Temp(Src) 98.4 F (36.9 C) (Oral)  Resp 24  Ht 5' (1.524 m)  Wt 160 lb (72.576 kg)  BMI 31.25 kg/m2  SpO2 97%  Physical Exam  Nursing note and vitals reviewed. Constitutional: She is oriented to person, place, and time. She appears well-developed and well-nourished. No distress.       Vital signs significant for mild tachycardia with heart rate 103 and mild tachypnea with respiratory rate of 24.  HENT:  Head: Normocephalic and atraumatic.  Eyes: EOM are normal. Pupils are equal, round, and reactive to light.  Neck: Normal range of motion. Neck supple. No tracheal deviation present.  Cardiovascular: Normal rate, regular rhythm and normal heart sounds.   Pulmonary/Chest: Effort normal and breath sounds normal. No respiratory distress.  Abdominal: Soft. She exhibits no distension.       Bowel sounds are diminished   Musculoskeletal: Normal range of motion.  Neurological: She is alert and oriented to person, place, and time.  Skin: Skin is warm and dry.  Psychiatric: She has a normal mood and affect. Her behavior is normal.    ED Course  Procedures (including critical care time)  DIAGNOSTIC STUDIES: Oxygen Saturation is 97% on room air, normal by my interpretation.    COORDINATION OF CARE:  7:13AM EDP Ordered Medication: Zofran 4 mg, Imodium 4 mg, NaCl infusion   Results for orders placed during the hospital encounter of 02/11/12  CBC      Component Value Range   WBC 10.8 (*) 4.0 - 10.5 (K/uL)   RBC 4.97  3.87 - 5.11 (MIL/uL)   Hemoglobin 13.8  12.0 - 15.0 (g/dL)   HCT 16.1   09.6 - 04.5 (%)   MCV 79.7  78.0 - 100.0 (fL)   MCH 27.8  26.0 - 34.0 (pg)   MCHC 34.8  30.0 - 36.0 (g/dL)   RDW 40.9 (*) 81.1 - 15.5 (%)   Platelets 321  150 - 400 (K/uL)  DIFFERENTIAL      Component Value Range   Neutrophils Relative 79 (*) 43 - 77 (%)   Neutro Abs 8.5 (*) 1.7 - 7.7 (K/uL)   Lymphocytes Relative 15  12 - 46 (%)   Lymphs Abs 1.6  0.7 - 4.0 (K/uL)   Monocytes Relative 5  3 - 12 (%)   Monocytes Absolute 0.6  0.1 - 1.0 (K/uL)   Eosinophils Relative 0  0 - 5 (%)   Eosinophils Absolute 0.0  0.0 - 0.7 (K/uL)   Basophils Relative 0  0 - 1 (%)   Basophils Absolute 0.0  0.0 - 0.1 (K/uL)  BASIC METABOLIC PANEL      Component Value Range   Sodium 139  135 - 145 (mEq/L)   Potassium 3.5  3.5 - 5.1 (mEq/L)   Chloride 106  96 - 112 (mEq/L)   CO2 23  19 - 32 (mEq/L)   Glucose, Bld 109 (*) 70 - 99 (mg/dL)   BUN 9  6 - 23 (mg/dL)   Creatinine, Ser 9.14 (*) 0.50 - 1.10 (mg/dL)   Calcium 9.4  8.4 - 78.2 (mg/dL)   GFR calc non Af Amer >90  >90 (mL/min)   GFR calc Af Amer >90  >90 (mL/min)   No results found.   9562: After IV fluids, IV Zofran, and oral loperamide, she is feeling considerably better. She will be sent home with a prescription for Reglan tablets and advised to use over-the-counter loperamide as needed for diarrhea. 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  1. Gastroenteritis       MDM  Clinically, most likely an episode of viral gastroenteritis. She will be given IV hydration and antiemetics as well as oral antimotility agents. Old records are reviewed and she is noted to have had an ED visit in November was virtually the same history.      I personally performed the services described in this documentation, which was scribed in my presence. The recorded information has been reviewed and considered.      Jacqueline Booze, MD 02/11/12 1308  Jacqueline Booze, MD 02/11/12 [phone]

## 2012-02-11 NOTE — ED Notes (Signed)
Started with nausea, vomiting, and diarrhea yesterday per pt. Not able to keep anything down per pt.

## 2012-03-01 ENCOUNTER — Encounter (HOSPITAL_COMMUNITY): Payer: Self-pay | Admitting: Emergency Medicine

## 2012-03-01 ENCOUNTER — Emergency Department (HOSPITAL_COMMUNITY)
Admission: EM | Admit: 2012-03-01 | Discharge: 2012-03-01 | Disposition: A | Discharge status: Home Health | Payer: Self-pay | Attending: Emergency Medicine | Admitting: Emergency Medicine

## 2012-03-01 DIAGNOSIS — R110 Nausea: Secondary | ICD-10-CM | POA: Insufficient documentation

## 2012-03-01 DIAGNOSIS — F411 Generalized anxiety disorder: Secondary | ICD-10-CM | POA: Insufficient documentation

## 2012-03-01 DIAGNOSIS — F419 Anxiety disorder, unspecified: Secondary | ICD-10-CM

## 2012-03-01 NOTE — ED Provider Notes (Signed)
History    This chart was scribed for [employer]. Colon Branch, MD, MD by Smitty Pluck. The patient was seen in room APA09 and the patient's care was started at 7:56AM.    CSN: [id number]  Arrival date & time 03/01/12  0725   First MD Initiated Contact with Patient 03/01/12 [phone]      Chief Complaint  Patient presents with  . Nausea  . Anxiety    (Consider location/radiation/quality/duration/timing/severity/associated sxs/prior treatment) Patient is a 36 y.o. female presenting with anxiety. The history is provided by the patient.  Anxiety   Jacqueline [name] is a 36 y.o. female who presents to the Emergency Department complaining of persistent anxiety and moderate nausea onset 3 weeks ago. Pt reports that she wakes up from her sleep with  "Thoughts in her head" and nausea. Denies SI, HI, vomiting and fevers. She states that she has not been sleeping well. The symptoms have been constant since onset without radiation. She denies suicidal ideation, homicidal ideation. She has no h/o depression. She is mother of 3 children. No PCP  History reviewed. No pertinent past medical history.  Past Surgical History  Procedure Date  . Tubal ligation     Family History  Problem Relation Age of Onset  . Diabetes Other     History  Substance Use Topics  . Smoking status: Current Everyday Smoker -- 0.5 packs/day for 4 years    Types: Cigarettes  . Smokeless tobacco: Not on file  . Alcohol Use: No    OB History    Grav Para Term Preterm Abortions TAB SAB Ect Mult Living   3 3 3       3       Review of Systems  All other systems reviewed and are negative.   10 Systems reviewed and are negative for acute change except as noted in the HPI.  Allergies  Penicillins  Home Medications   Current Outpatient Rx  Name Route Sig Dispense Refill  . BISMUTH SUBSALICYLATE 262 MG/15ML PO SUSP Oral Take 15 mLs by mouth 2 (two) times daily as needed. For stomach upset     . DICYCLOMINE HCL 20 MG PO TABS  Oral Take 1 tablet (20 mg total) by mouth every 6 (six) hours. 8 tablet 0    BP 133/84  Pulse 89  Temp(Src) 98.2 F (36.8 C) (Oral)  Resp 18  Ht 5' (1.524 m)  Wt 165 lb (74.844 kg)  BMI 32.22 kg/m2  SpO2 100%  Physical Exam  Nursing note and vitals reviewed. Constitutional: She is oriented to person, place, and time. She appears well-developed and well-nourished. No distress.  HENT:  Head: Normocephalic and atraumatic.  Eyes: EOM are normal. Pupils are equal, round, and reactive to light.  Neck: Normal range of motion. Neck supple. No tracheal deviation present.  Cardiovascular: Normal rate, regular rhythm and normal heart sounds.   Pulmonary/Chest: Effort normal and breath sounds normal. No respiratory distress.  Abdominal: Soft. Bowel sounds are normal. She exhibits no distension.  Musculoskeletal: Normal range of motion.  Neurological: She is alert and oriented to person, place, and time.  Skin: Skin is warm and dry.  Psychiatric: Her behavior is normal.       Anxious and crying    ED Course  Procedures (including critical care time) DIAGNOSTIC STUDIES: Oxygen Saturation is 100% on room air, normal by my interpretation.    COORDINATION OF CARE: 8:04AM EDP discusses pt ED treatment course with pt and post ED treatment  4010 Spoke with Frances [facility]wood [facility]. He recommended she contact [facility] [phone]. They will be able expedite a referral.  MDM: Patient with anxiety for three weeks accompanied by reflux at night/early morning. She does not meet criteria for psychiatric admission. She will be referred to [facility]. .Pt stable in ED with no significant deterioration in condition.The patient appears reasonably screened and/or stabilized for discharge and I doubt any other medical condition or other [facility] requiring further screening, evaluation, or treatment in the ED at this time prior to discharge.  I personally performed the services described in this documentation,  which was scribed in my presence. The recorded information has been reviewed and considered.   MDM Reviewed: nursing note and vitals Consults: behavioral health.      Nicoletta Dress. Colon Branch, MD 03/01/12 [phone]

## 2012-03-01 NOTE — Discharge Instructions (Signed)
FOR THE REFLUX, BEGIN USING A PEPCID AC AT BEDTIME. FOR YOUR ANXIETY, WE RECOMMEND YOU CONTACT [facility] AT [phone]. THEY CAN HELP YOU GET INTO A COUNSELOR OR PROGRAM TO HELP WITH YOUR ANXIETY.Marland Kitchen   Anxiety and Panic Attacks Anxiety is your body's way of reacting to real danger or something you think is a danger. It may be fear or worry over a situation like losing your job. Sometimes the cause is not known. A panic attack is made up of physical signs like sweating, shaking, or chest pain. Anxiety and panic attacks may start suddenly. They may be strong. They may come at any time of day, even while sleeping. They may come at any time of life. Panic attacks are scary, but they do not harm you physically.  HOME CARE  Avoid any known causes of your anxiety.   Try to relax. Yoga may help. Tell yourself everything will be okay.   Exercise often.   Get expert advice and help (therapy) to stop anxiety or attacks from happening.   Avoid caffeine, alcohol, and drugs.   Only take medicine as told by your doctor.  GET HELP RIGHT AWAY IF:  Your attacks seem different than normal attacks.   Your problems are getting worse or concern you.  MAKE SURE YOU:  Understand these instructions.   Will watch your condition.   Will get help right away if you are not doing well or get worse.  Document Released: 01/17/2011 Document Revised: 12/04/2011 Document Reviewed: 01/17/2011 [facility] Patient Information 2012 [place], [place].

## 2012-03-01 NOTE — ED Notes (Signed)
Pt states she wakes up every morning with nausea and states she has not been sleeping well and feels very anxious x 3 weeks.

## 2012-11-21 ENCOUNTER — Emergency Department (HOSPITAL_COMMUNITY)
Admission: EM | Admit: 2012-11-21 | Discharge: 2012-11-21 | Disposition: A | Discharge status: Home / Self Care | Payer: Self-pay | Attending: Emergency Medicine | Admitting: Emergency Medicine

## 2012-11-21 ENCOUNTER — Encounter (HOSPITAL_COMMUNITY): Payer: Self-pay | Admitting: *Deleted

## 2012-11-21 DIAGNOSIS — J3489 Other specified disorders of nose and nasal sinuses: Secondary | ICD-10-CM | POA: Insufficient documentation

## 2012-11-21 DIAGNOSIS — H6120 Impacted cerumen, unspecified ear: Secondary | ICD-10-CM | POA: Insufficient documentation

## 2012-11-21 DIAGNOSIS — F17200 Nicotine dependence, unspecified, uncomplicated: Secondary | ICD-10-CM | POA: Insufficient documentation

## 2012-11-21 DIAGNOSIS — H9190 Unspecified hearing loss, unspecified ear: Secondary | ICD-10-CM | POA: Insufficient documentation

## 2012-11-21 DIAGNOSIS — J329 Chronic sinusitis, unspecified: Secondary | ICD-10-CM | POA: Insufficient documentation

## 2012-11-21 DIAGNOSIS — R509 Fever, unspecified: Secondary | ICD-10-CM | POA: Insufficient documentation

## 2012-11-21 MED ORDER — ANTIPYRINE-BENZOCAINE 5.4-1.4 % OT SOLN
3.0000 [drp] | Freq: Once | OTIC | Status: AC
  Administered 2012-11-21: 4 [drp] via OTIC
  Filled 2012-11-21: qty 10

## 2012-11-21 MED ORDER — AZITHROMYCIN 500 MG PO TABS: ORAL_TABLET | ORAL | Status: DC

## 2012-11-21 NOTE — ED Notes (Signed)
Nasal and head congestion with ear pressure and unable to hear x 3 wks.  Also reporting intermittent fever.

## 2012-11-21 NOTE — ED Provider Notes (Signed)
History     CSN: [id number]  Arrival date & time 11/21/12  1635   First MD Initiated Contact with Patient 11/21/12 1737      Chief Complaint  Patient presents with  . Nasal Congestion  . Otalgia    (Consider location/radiation/quality/duration/timing/severity/associated sxs/prior treatment) Patient is a 36 y.o. female presenting with ear pain. The history is provided by the patient.  Otalgia This is a new problem. The current episode started more than 1 week ago. There is pain in the right ear. The problem occurs constantly. The problem has not changed since onset.The maximum temperature recorded prior to her arrival was 100 to 100.9 F. The fever has been present for less than 1 day. The pain is moderate. Associated symptoms include hearing loss and rhinorrhea. Pertinent negatives include no ear discharge, no headaches, no sore throat, no abdominal pain, no diarrhea, no vomiting, no neck pain, no cough and no rash.    History reviewed. No pertinent past medical history.  Past Surgical History  Procedure Date  . Tubal ligation     Family History  Problem Relation Age of Onset  . Diabetes Other     History  Substance Use Topics  . Smoking status: Current Every Day Smoker -- 0.5 packs/day for 4 years    Types: Cigarettes  . Smokeless tobacco: Not on file  . Alcohol Use: No    OB History    Grav Para Term Preterm Abortions TAB SAB Ect Mult Living   3 3 3       3       Review of Systems  Constitutional: Positive for fever. Negative for chills, activity change and appetite change.  HENT: Positive for hearing loss, ear pain, congestion and rhinorrhea. Negative for sore throat, facial swelling, trouble swallowing, neck pain, tinnitus and ear discharge.   Respiratory: Negative for cough.   Gastrointestinal: Negative for vomiting, abdominal pain and diarrhea.  Skin: Negative for rash.  Neurological: Negative for dizziness, seizures, weakness, numbness and headaches.  All  other systems reviewed and are negative.    Allergies  Penicillins  Home Medications  No current outpatient prescriptions on file.  BP 134/91  Pulse 95  Temp 98 F (36.7 C) (Oral)  Resp 20  Ht 5' (1.524 m)  Wt 155 lb (70.308 kg)  BMI 30.27 kg/m2  SpO2 100%  LMP 11/17/2012  Physical Exam  Nursing note and vitals reviewed. Constitutional: She is oriented to person, place, and time. She appears well-developed and well-nourished. No distress.  HENT:  Head: Normocephalic and atraumatic. No trismus in the jaw.  Nose: Mucosal edema and rhinorrhea present. Right sinus exhibits maxillary sinus tenderness and frontal sinus tenderness. Left sinus exhibits maxillary sinus tenderness and frontal sinus tenderness.  Mouth/Throat: Uvula is midline and mucous membranes are normal. No uvula swelling. No oropharyngeal exudate, posterior oropharyngeal edema, posterior oropharyngeal erythema or tonsillar abscesses.       Moderate cerumen impaction to the bilateral ear canals.  External ear appears nml.  TM's were not visualized due to cerumen  Neck: Normal range of motion and phonation normal. Neck supple. No Brudzinski's sign and no Kernig's sign noted.  Cardiovascular: Normal rate, regular rhythm, normal heart sounds and intact distal pulses.   No murmur heard. Pulmonary/Chest: Effort normal and breath sounds normal. She has no wheezes. She has no rales.  Abdominal: Soft. Bowel sounds are normal.  Musculoskeletal: She exhibits no edema.  Lymphadenopathy:    She has no cervical adenopathy.  Neurological: She  is alert and oriented to person, place, and time. She exhibits normal muscle tone. Coordination normal.  Skin: Skin is warm and dry.    ED Course  Procedures (including critical care time)  Labs Reviewed - No data to display No results found.      MDM   Both ears were irrigated with warm water.  Auralgan otic also applied.  Large amt of cerumen removed from the left ear and  moderate amt removed from t ehr ight.  Right TM partially visualized and appears nml.  left TM nml.  Pain improved, hearing restored.  No ataxia.  Pt is feeling better.    Auralgan dispensed for home use.    Prescribed: zithromax     Nehemie Casserly L. [place], [place] 11/24/12 1912

## 2012-11-30 NOTE — ED Provider Notes (Signed)
Medical screening examination/treatment/procedure(s) were performed by non-physician practitioner and as supervising physician I was immediately available for consultation/collaboration.  Donnetta Hutching, MD 11/30/12 1944

## 2013-03-15 ENCOUNTER — Emergency Department (HOSPITAL_COMMUNITY)
Admission: EM | Admit: 2013-03-15 | Discharge: 2013-03-15 | Disposition: A | Discharge status: Home / Self Care | Payer: Self-pay | Attending: Emergency Medicine | Admitting: Emergency Medicine

## 2013-03-15 ENCOUNTER — Encounter (HOSPITAL_COMMUNITY): Payer: Self-pay | Admitting: Emergency Medicine

## 2013-03-15 DIAGNOSIS — J329 Chronic sinusitis, unspecified: Secondary | ICD-10-CM | POA: Insufficient documentation

## 2013-03-15 DIAGNOSIS — R509 Fever, unspecified: Secondary | ICD-10-CM | POA: Insufficient documentation

## 2013-03-15 DIAGNOSIS — R51 Headache: Secondary | ICD-10-CM | POA: Insufficient documentation

## 2013-03-15 DIAGNOSIS — F17200 Nicotine dependence, unspecified, uncomplicated: Secondary | ICD-10-CM | POA: Insufficient documentation

## 2013-03-15 DIAGNOSIS — Z8709 Personal history of other diseases of the respiratory system: Secondary | ICD-10-CM | POA: Insufficient documentation

## 2013-03-15 HISTORY — DX: Chronic sinusitis, unspecified: J32.9

## 2013-03-15 HISTORY — DX: Acute upper respiratory infection, unspecified: J06.9

## 2013-03-15 MED ORDER — AZITHROMYCIN 250 MG PO TABS: ORAL_TABLET | ORAL | Status: DC

## 2013-03-15 NOTE — ED Provider Notes (Signed)
History     CSN: [id number]  Arrival date & time 03/15/13  1655   First MD Initiated Contact with Patient 03/15/13 1750      Chief Complaint  Patient presents with  . Nasal Congestion  . Headache    (Consider location/radiation/quality/duration/timing/severity/associated sxs/prior treatment) Patient is a 37 y.o. female presenting with headaches. The history is provided by the patient.  Headache Pain location:  Frontal Quality:  Dull Radiates to:  Does not radiate Onset quality:  Gradual Duration:  10 hours Progression:  Waxing and waning Chronicity:  New Similar to prior headaches: yes   Context: not exposure to bright light, not coughing, not eating, not loud noise and not straining   Relieved by:  Nothing Worsened by:  Nothing tried Ineffective treatments:  Acetaminophen Associated symptoms: congestion, sinus pressure and URI   Associated symptoms: no abdominal pain, no back pain, no blurred vision, no cough, no dizziness, no drainage, no ear pain, no pain, no fever, no loss of balance, no nausea, no neck pain, no neck stiffness, no numbness, no photophobia, no sore throat, no swollen glands, no syncope and no vomiting   Congestion:    Location:  Nasal   Interferes with sleep: no     Interferes with eating/drinking: no     Past Medical History  Diagnosis Date  . URI (upper respiratory infection)   . Sinusitis     Past Surgical History  Procedure Laterality Date  . Tubal ligation      Family History  Problem Relation Age of Onset  . Diabetes Other     History  Substance Use Topics  . Smoking status: Current Every Day Smoker -- 0.50 packs/day for 4 years    Types: Cigarettes  . Smokeless tobacco: Never Used  . Alcohol Use: No    OB History   Grav Para Term Preterm Abortions TAB SAB Ect Mult Living   3 3 3       3       Review of Systems  Constitutional: Negative for fever, chills, activity change and appetite change.  HENT: Positive for congestion,  rhinorrhea, sneezing and sinus pressure. Negative for ear pain, sore throat, facial swelling, trouble swallowing, neck pain, neck stiffness and postnasal drip.   Eyes: Negative for blurred vision, photophobia, pain and visual disturbance.  Respiratory: Negative for cough, shortness of breath, wheezing and stridor.   Cardiovascular: Negative for syncope.  Gastrointestinal: Negative for nausea, vomiting and abdominal pain.  Musculoskeletal: Negative for back pain.  Skin: Negative.  Negative for rash.  Neurological: Positive for headaches. Negative for dizziness, syncope, facial asymmetry, weakness, numbness and loss of balance.  Hematological: Negative for adenopathy.  Psychiatric/Behavioral: Negative for confusion.  All other systems reviewed and are negative.    Allergies  Penicillins  Home Medications   Current Outpatient Rx  Name  Route  Sig  Dispense  Refill  . Multiple Vitamin (MULTIVITAMIN WITH MINERALS) TABS   Oral   Take 1 tablet by mouth every morning.         Marland Kitchen Phenyleph-Doxylamine-DM-APAP (TYLENOL COLD MULTI-SYMPTOM) 5-6.25-10-325 MG/15ML LIQD   Oral   Take 15-30 mLs by mouth once as needed (for cold and sinus symptoms).           BP 114/77  Pulse 80  Temp(Src) 98.6 F (37 C) (Oral)  Resp 18  Ht 5' (1.524 m)  Wt 150 lb (68.04 kg)  BMI 29.3 kg/m2  SpO2 100%  LMP 03/02/2013  Physical Exam  Nursing  note and vitals reviewed. Constitutional: She is oriented to person, place, and time. She appears well-developed and well-nourished. No distress.  HENT:  Head: Normocephalic and atraumatic. No trismus in the jaw.  Right Ear: Tympanic membrane and ear canal normal.  Left Ear: Tympanic membrane and ear canal normal.  Nose: Mucosal edema and rhinorrhea present. Right sinus exhibits maxillary sinus tenderness. Left sinus exhibits maxillary sinus tenderness.  Mouth/Throat: Uvula is midline and mucous membranes are normal. No edematous. Posterior oropharyngeal  erythema present. No oropharyngeal exudate, posterior oropharyngeal edema or tonsillar abscesses.  Eyes: Conjunctivae and EOM are normal. Pupils are equal, round, and reactive to light.  Neck: Normal range of motion and phonation normal. Neck supple. No Brudzinski's sign and no Kernig's sign noted.  Cardiovascular: Normal rate, regular rhythm, normal heart sounds and intact distal pulses.   No murmur heard. Pulmonary/Chest: Effort normal and breath sounds normal. She has no wheezes. She has no rales.  Musculoskeletal: Normal range of motion. She exhibits no edema.  Lymphadenopathy:    She has no cervical adenopathy.  Neurological: She is alert and oriented to person, place, and time. She exhibits normal muscle tone. Coordination normal.  Skin: Skin is warm and dry.    ED Course  Procedures (including critical care time)  Labs Reviewed - No data to display No results found.      MDM   Patient is alert, NAD.  ttp of the maxillary sinuses with bilateral mucosal edema.  She is otherwise well appearing.  No neuro deficits, no meningeal signs.  Will treat with z-pack, she agrees to OTC nasal spray as directed for 3 days and close f/u with her PMD if needed.    The patient appears reasonably screened and/or stabilized for discharge and I doubt any other medical condition or other [facility] requiring further screening, evaluation, or treatment in the ED at this time prior to discharge.        Yocheved Depner L. Trisha Mangle, PA-C 03/15/13 1804

## 2013-03-15 NOTE — ED Notes (Signed)
Patient reports taking tylenol cold and sinus this morning at 0800.

## 2013-03-15 NOTE — ED Notes (Signed)
Patient c/o sinus pressure, headache, sore throat, and fevers that started this morning. Per patient hx of recurrent sinus infections and URIs. Patient reports occasional productive cough with thick yellow mucus.

## 2013-03-17 NOTE — ED Provider Notes (Signed)
Medical screening examination/treatment/procedure(s) were performed by non-physician practitioner and as supervising physician I was immediately available for consultation/collaboration.   Shannen Vernon M Lorian Yaun, DO 03/17/13 1204 

## 2013-05-06 ENCOUNTER — Encounter (HOSPITAL_COMMUNITY): Payer: Self-pay

## 2013-05-06 ENCOUNTER — Emergency Department (HOSPITAL_COMMUNITY)
Admission: EM | Admit: 2013-05-06 | Discharge: 2013-05-06 | Disposition: A | Discharge status: Home / Self Care | Payer: Self-pay | Attending: Emergency Medicine | Admitting: Emergency Medicine

## 2013-05-06 DIAGNOSIS — S60219A Contusion of unspecified wrist, initial encounter: Secondary | ICD-10-CM | POA: Insufficient documentation

## 2013-05-06 DIAGNOSIS — Y929 Unspecified place or not applicable: Secondary | ICD-10-CM | POA: Insufficient documentation

## 2013-05-06 DIAGNOSIS — R059 Cough, unspecified: Secondary | ICD-10-CM | POA: Insufficient documentation

## 2013-05-06 DIAGNOSIS — F17200 Nicotine dependence, unspecified, uncomplicated: Secondary | ICD-10-CM | POA: Insufficient documentation

## 2013-05-06 DIAGNOSIS — X58XXXA Exposure to other specified factors, initial encounter: Secondary | ICD-10-CM | POA: Insufficient documentation

## 2013-05-06 DIAGNOSIS — Y939 Activity, unspecified: Secondary | ICD-10-CM | POA: Insufficient documentation

## 2013-05-06 DIAGNOSIS — R05 Cough: Secondary | ICD-10-CM | POA: Insufficient documentation

## 2013-05-06 DIAGNOSIS — Z8709 Personal history of other diseases of the respiratory system: Secondary | ICD-10-CM | POA: Insufficient documentation

## 2013-05-06 DIAGNOSIS — Z880 Allergy status to penicillin: Secondary | ICD-10-CM | POA: Insufficient documentation

## 2013-05-06 DIAGNOSIS — R42 Dizziness and giddiness: Secondary | ICD-10-CM | POA: Insufficient documentation

## 2013-05-06 DIAGNOSIS — R5381 Other malaise: Secondary | ICD-10-CM | POA: Insufficient documentation

## 2013-05-06 DIAGNOSIS — R6883 Chills (without fever): Secondary | ICD-10-CM | POA: Insufficient documentation

## 2013-05-06 DIAGNOSIS — R109 Unspecified abdominal pain: Secondary | ICD-10-CM | POA: Insufficient documentation

## 2013-05-06 DIAGNOSIS — R112 Nausea with vomiting, unspecified: Secondary | ICD-10-CM | POA: Insufficient documentation

## 2013-05-06 DIAGNOSIS — R197 Diarrhea, unspecified: Secondary | ICD-10-CM | POA: Insufficient documentation

## 2013-05-06 LAB — URINE MICROSCOPIC-ADD ON

## 2013-05-06 LAB — CBC WITH DIFFERENTIAL/PLATELET
Basophils Relative: 0 % (ref 0–1)
Hemoglobin: 12.8 g/dL (ref 12.0–15.0)
MCHC: 34.7 g/dL (ref 30.0–36.0)
Monocytes Relative: 6 % (ref 3–12)
Neutro Abs: 6.5 10*3/uL (ref 1.7–7.7)
Neutrophils Relative %: 70 % (ref 43–77)
Platelets: 309 10*3/uL (ref 150–400)
RBC: 4.73 MIL/uL (ref 3.87–5.11)

## 2013-05-06 LAB — URINALYSIS, ROUTINE W REFLEX MICROSCOPIC
Glucose, UA: NEGATIVE mg/dL
Ketones, ur: 15 mg/dL — AB
Leukocytes, UA: NEGATIVE
pH: 6 (ref 5.0–8.0)

## 2013-05-06 LAB — COMPREHENSIVE METABOLIC PANEL
ALT: 8 U/L (ref 0–35)
AST: 13 U/L (ref 0–37)
Albumin: 4.4 g/dL (ref 3.5–5.2)
Alkaline Phosphatase: 57 U/L (ref 39–117)
BUN: 10 mg/dL (ref 6–23)
Chloride: 102 mEq/L (ref 96–112)
Potassium: 3.7 mEq/L (ref 3.5–5.1)
Sodium: 137 mEq/L (ref 135–145)
Total Bilirubin: 1.5 mg/dL — ABNORMAL HIGH (ref 0.3–1.2)

## 2013-05-06 MED ORDER — FAMOTIDINE IN NACL 20-0.9 MG/50ML-% IV SOLN
20.0000 mg | Freq: Once | INTRAVENOUS | Status: AC
  Administered 2013-05-06: 20 mg via INTRAVENOUS
  Filled 2013-05-06: qty 50

## 2013-05-06 MED ORDER — ONDANSETRON HCL 4 MG PO TABS: 4.0000 mg | ORAL_TABLET | Freq: Four times a day (QID) | ORAL | Status: DC

## 2013-05-06 MED ORDER — SODIUM CHLORIDE 0.9 % IV SOLN
Freq: Once | INTRAVENOUS | Status: AC
  Administered 2013-05-06: 11:00:00 via INTRAVENOUS

## 2013-05-06 MED ORDER — ONDANSETRON HCL 4 MG/2ML IJ SOLN
4.0000 mg | Freq: Once | INTRAMUSCULAR | Status: AC
  Administered 2013-05-06: 4 mg via INTRAVENOUS
  Filled 2013-05-06: qty 2

## 2013-05-06 MED ORDER — RANITIDINE HCL 150 MG PO TABS: 150.0000 mg | ORAL_TABLET | Freq: Two times a day (BID) | ORAL | Status: DC

## 2013-05-06 NOTE — ED Provider Notes (Signed)
History     CSN: [id number]  Arrival date & time 05/06/13  1008   First MD Initiated Contact with Patient 05/06/13 1011      Chief Complaint  Patient presents with  . Emesis  . Dizziness    (Consider location/radiation/quality/duration/timing/severity/associated sxs/prior treatment) Patient is a 37 y.o. female presenting with vomiting. The history is provided by the patient.  Emesis Severity:  Moderate Duration:  1 week Number of daily episodes:  5 Quality:  Stomach contents Progression:  Unchanged Chronicity:  New Recent urination:  Normal Relieved by:  Nothing Ineffective treatments:  Ice chips and liquids Associated symptoms: abdominal pain, chills, diarrhea and myalgias   Associated symptoms: no headaches   Risk factors: not pregnant now, no sick contacts, no suspect food intake and no travel to endemic areas    [place] D Skillman is a 37 y.o. female who presents to the ED with nausea and vomiting. The symptoms started 2 days ago. She describes the vomiting as undigested food the first 2 times and since then has been yellow liquid.  She vomits 5 X per day. She had diarrhea first and then the vomiting. She felt weak and dehydrated and passes out. She denies any injuries other than a bruise to her right wrist. She denies headache or other problems. Past Medical History  Diagnosis Date  . URI (upper respiratory infection)   . Sinusitis     Past Surgical History  Procedure Laterality Date  . Tubal ligation      Family History  Problem Relation Age of Onset  . Diabetes Other     History  Substance Use Topics  . Smoking status: Current Every Day Smoker -- 0.50 packs/day for 4 years    Types: Cigarettes  . Smokeless tobacco: Never Used  . Alcohol Use: No    OB History   Grav Para Term Preterm Abortions TAB SAB Ect Mult Living   3 3 3       3       Review of Systems  Constitutional: Positive for chills. Negative for fever.  HENT: Negative for neck pain.    Respiratory: Positive for cough. Negative for shortness of breath.   Cardiovascular: Negative for leg swelling.  Gastrointestinal: Positive for vomiting, abdominal pain and diarrhea.  Genitourinary: Negative for vaginal bleeding and pelvic pain.  Musculoskeletal: Positive for myalgias. Negative for back pain.  Skin: Negative for rash.  Neurological: Positive for weakness and light-headedness. Negative for headaches.  Psychiatric/Behavioral: The patient is not nervous/anxious.     Allergies  Penicillins  Home Medications   Current Outpatient Rx  Name  Route  Sig  Dispense  Refill  . azithromycin (ZITHROMAX Z-PAK) 250 MG tablet      Take two tablets on day one, then one tab qd days 2-5   6 tablet   0   . Multiple Vitamin (MULTIVITAMIN WITH MINERALS) TABS   Oral   Take 1 tablet by mouth every morning.         Marland Kitchen Phenyleph-Doxylamine-DM-APAP (TYLENOL COLD MULTI-SYMPTOM) 5-6.25-10-325 MG/15ML LIQD   Oral   Take 15-30 mLs by mouth once as needed (for cold and sinus symptoms).           BP 112/81  Pulse 90  Temp(Src) 97.7 F (36.5 C) (Oral)  Resp 16  Ht 5' (1.524 m)  Wt 145 lb (65.772 kg)  BMI 28.32 kg/m2  SpO2 98%  LMP 04/22/2013  Physical Exam  Nursing note and vitals reviewed. Constitutional: She is  oriented to person, place, and time. She appears well-developed and well-nourished. No distress.  HENT:  Head: Normocephalic and atraumatic.  Eyes: Conjunctivae and EOM are normal. Pupils are equal, round, and reactive to light.  Neck: Normal range of motion. Neck supple.  Cardiovascular: Normal rate, regular rhythm and normal heart sounds.   Pulmonary/Chest: Effort normal and breath sounds normal.  Abdominal: Soft. Bowel sounds are normal. Tenderness: mild epigastric   Musculoskeletal: Normal range of motion. She exhibits no edema.  Neurological: She is alert and oriented to person, place, and time. No cranial nerve deficit.  Skin: Skin is warm and dry.   Psychiatric: She has a normal mood and affect. Her behavior is normal.   Results for orders placed during the hospital encounter of 05/06/13 (from the past 24 hour(s))  URINALYSIS, ROUTINE W REFLEX MICROSCOPIC     Status: Abnormal   Collection Time    05/06/13 10:30 AM      Result Value Range   Color, Urine YELLOW  YELLOW   APPearance CLEAR  CLEAR   Specific Gravity, Urine >1.030 (*) 1.005 - 1.030   pH 6.0  5.0 - 8.0   Glucose, UA NEGATIVE  NEGATIVE mg/dL   Hgb urine dipstick NEGATIVE  NEGATIVE   Bilirubin Urine MODERATE (*) NEGATIVE   Ketones, ur 15 (*) NEGATIVE mg/dL   Protein, ur TRACE (*) NEGATIVE mg/dL   Urobilinogen, UA 0.2  0.0 - 1.0 mg/dL   Nitrite NEGATIVE  NEGATIVE   Leukocytes, UA NEGATIVE  NEGATIVE  URINE MICROSCOPIC-ADD ON     Status: Abnormal   Collection Time    05/06/13 10:30 AM      Result Value Range   Squamous Epithelial / LPF FEW (*) RARE   WBC, UA 0-2  <3 WBC/hpf   RBC / HPF 0-2  <3 RBC/hpf  POCT PREGNANCY, URINE     Status: None   Collection Time    05/06/13 10:36 AM      Result Value Range   Preg Test, Ur NEGATIVE  NEGATIVE  CBC WITH DIFFERENTIAL     Status: Abnormal   Collection Time    05/06/13 10:53 AM      Result Value Range   WBC 9.3  4.0 - 10.5 K/uL   RBC 4.73  3.87 - 5.11 MIL/uL   Hemoglobin 12.8  12.0 - 15.0 g/dL   HCT 81.1  91.4 - 78.2 %   MCV 78.0  78.0 - 100.0 fL   MCH 27.1  26.0 - 34.0 pg   MCHC 34.7  30.0 - 36.0 g/dL   RDW 95.6 (*) 21.3 - 08.6 %   Platelets 309  150 - 400 K/uL   Neutrophils Relative 70  43 - 77 %   Neutro Abs 6.5  1.7 - 7.7 K/uL   Lymphocytes Relative 24  12 - 46 %   Lymphs Abs 2.2  0.7 - 4.0 K/uL   Monocytes Relative 6  3 - 12 %   Monocytes Absolute 0.6  0.1 - 1.0 K/uL   Eosinophils Relative 0  0 - 5 %   Eosinophils Absolute 0.0  0.0 - 0.7 K/uL   Basophils Relative 0  0 - 1 %   Basophils Absolute 0.0  0.0 - 0.1 K/uL  COMPREHENSIVE METABOLIC PANEL     Status: Abnormal   Collection Time    05/06/13 10:53 AM       Result Value Range   Sodium 137  135 - 145 mEq/L   Potassium  3.7  3.5 - 5.1 mEq/L   Chloride 102  96 - 112 mEq/L   CO2 22  19 - 32 mEq/L   Glucose, Bld 102 (*) 70 - 99 mg/dL   BUN 10  6 - 23 mg/dL   Creatinine, Ser 6.29  0.50 - 1.10 mg/dL   Calcium 9.7  8.4 - 52.8 mg/dL   Total Protein 7.7  6.0 - 8.3 g/dL   Albumin 4.4  3.5 - 5.2 g/dL   AST 13  0 - 37 U/L   ALT 8  0 - 35 U/L   Alkaline Phosphatase 57  39 - 117 U/L   Total Bilirubin 1.5 (*) 0.3 - 1.2 mg/dL   GFR calc non Af Amer >90  >90 mL/min   GFR calc Af Amer >90  >90 mL/min  LIPASE, BLOOD     Status: None   Collection Time    05/06/13 10:53 AM      Result Value Range   Lipase 23  11 - 59 U/L    Assessment: 37 y.o. female with nausea, vomiting and diarrhea   Mild dehydration   Gastric reflux  Plan:  IV hydration, Zofran, Pepcid   Rest, PO fluids, follow up with PCP ED Course  Procedures (including critical care time) After IV hydration, Zofran and Pepcid IV the patient states she is feeling much better. She states she feels she can go home and continue PO hydration.   MDM  I have reviewed this patient's vital signs, nurses notes, appropriate labs and discussed findings and plan of care with the patient. She voices understanding. Patient stable for discharge home without any immediate complications.    Medication List    TAKE these medications       ondansetron 4 MG tablet  Commonly known as:  ZOFRAN  Take 1 tablet (4 mg total) by mouth every 6 (six) hours.     ranitidine 150 MG tablet  Commonly known as:  ZANTAC  Take 1 tablet (150 mg total) by mouth 2 (two) times daily.      ASK your doctor about these medications       azithromycin 250 MG tablet  Commonly known as:  ZITHROMAX Z-PAK  Take two tablets on day one, then one tab qd days 2-5     calcium carbonate 500 MG chewable tablet  Commonly known as:  TUMS - dosed in mg elemental calcium  Chew 1 tablet by mouth as needed for heartburn.      multivitamin with minerals Tabs  Take 1 tablet by mouth every morning.     TYLENOL COLD MULTI-SYMPTOM 5-6.25-10-325 MG/15ML Liqd  Generic drug:  Phenyleph-Doxylamine-DM-APAP  Take 15-30 mLs by mouth once as needed (for cold and sinus symptoms).               [facility] Orlene Och, [place] 05/06/13 [phone]

## 2013-05-06 NOTE — ED Notes (Signed)
Nausea, Vomiting, dizziness per pt.

## 2013-05-07 NOTE — ED Notes (Signed)
Pt called back and stated she has no insurance and could not afford zofran rx and wanted to know if there was anything else we could rx. Dr Lars Mage vo Phenergen 25 mg po q6hr prn nausea. Dispense 8. Read back and verified. rx called to [facility] pharmacy per pt request. Pt aware.

## 2013-05-08 NOTE — ED Provider Notes (Signed)
Medical screening examination/treatment/procedure(s) were conducted as a shared visit with non-physician practitioner(s) and myself.  I personally evaluated the patient during the encounter.  No acute abdomen. Patient feels better after IV hydration.  Donnetta Hutching, MD 05/08/13 [phone]

## 2013-06-01 ENCOUNTER — Emergency Department (HOSPITAL_COMMUNITY)
Admission: EM | Admit: 2013-06-01 | Discharge: 2013-06-01 | Disposition: A | Discharge status: Home / Self Care | Payer: Self-pay | Attending: Emergency Medicine | Admitting: Emergency Medicine

## 2013-06-01 ENCOUNTER — Encounter (HOSPITAL_COMMUNITY): Payer: Self-pay | Admitting: Emergency Medicine

## 2013-06-01 DIAGNOSIS — F17200 Nicotine dependence, unspecified, uncomplicated: Secondary | ICD-10-CM | POA: Insufficient documentation

## 2013-06-01 DIAGNOSIS — Z880 Allergy status to penicillin: Secondary | ICD-10-CM | POA: Insufficient documentation

## 2013-06-01 DIAGNOSIS — N390 Urinary tract infection, site not specified: Secondary | ICD-10-CM | POA: Insufficient documentation

## 2013-06-01 DIAGNOSIS — Z79899 Other long term (current) drug therapy: Secondary | ICD-10-CM | POA: Insufficient documentation

## 2013-06-01 DIAGNOSIS — Z8709 Personal history of other diseases of the respiratory system: Secondary | ICD-10-CM | POA: Insufficient documentation

## 2013-06-01 LAB — URINALYSIS, ROUTINE W REFLEX MICROSCOPIC
Glucose, UA: 100 mg/dL — AB
Hgb urine dipstick: NEGATIVE
Ketones, ur: NEGATIVE mg/dL
Protein, ur: 30 mg/dL — AB
pH: 5 (ref 5.0–8.0)

## 2013-06-01 LAB — GLUCOSE, CAPILLARY: Glucose-Capillary: 81 mg/dL (ref 70–99)

## 2013-06-01 LAB — URINE MICROSCOPIC-ADD ON

## 2013-06-01 MED ORDER — ACETAMINOPHEN 500 MG PO TABS
1000.0000 mg | ORAL_TABLET | Freq: Once | ORAL | Status: AC
  Administered 2013-06-01: 1000 mg via ORAL
  Filled 2013-06-01: qty 2

## 2013-06-01 MED ORDER — NAPROXEN 500 MG PO TABS: 500.0000 mg | ORAL_TABLET | Freq: Two times a day (BID) | ORAL | Status: DC

## 2013-06-01 MED ORDER — NITROFURANTOIN MONOHYD MACRO 100 MG PO CAPS
100.0000 mg | ORAL_CAPSULE | Freq: Two times a day (BID) | ORAL | Status: DC
  Administered 2013-06-01: 100 mg via ORAL
  Filled 2013-06-01: qty 1

## 2013-06-01 MED ORDER — NITROFURANTOIN MONOHYD MACRO 100 MG PO CAPS: 100.0000 mg | ORAL_CAPSULE | Freq: Two times a day (BID) | ORAL | Status: DC

## 2013-06-01 MED ORDER — IBUPROFEN 800 MG PO TABS
800.0000 mg | ORAL_TABLET | Freq: Once | ORAL | Status: AC
  Administered 2013-06-01: 800 mg via ORAL
  Filled 2013-06-01: qty 1

## 2013-06-01 MED ORDER — CYCLOBENZAPRINE HCL 10 MG PO TABS: 10.0000 mg | ORAL_TABLET | Freq: Two times a day (BID) | ORAL | Status: DC | PRN

## 2013-06-01 NOTE — ED Notes (Signed)
Pt c/o lower back pain and urinary frequency since yesterday.

## 2013-06-01 NOTE — ED Provider Notes (Signed)
History     This chart was scribed for Donnetta Hutching, MD, MD by Smitty Pluck, ED Scribe. The patient was seen in room APFT21/APFT21 and the patient's care was started at 4:01 PM.   CSN: [id number]  Arrival date & time 06/01/13  1538   Chief Complaint  Patient presents with  . Urinary Tract Infection    Patient is a 36 y.o. female presenting with urinary tract infection. The history is provided by the patient and medical records. No language interpreter was used.  Urinary Tract Infection   HPI Comments: Jacqueline [name] is a 37 y.o. female who presents to the Emergency Department complaining of constant, moderate, left lower back pain onset 1 day ago. There was sudden onset. She reports having increased urine frequency. She reports hx of UTI (5-6/year).  She mentions that she has taken tylenol and Azo without relief. She denies any alleviating and aggravating factors. Pt denies dysuria, fever, chills, nausea, vomiting, diarrhea, weakness, cough, SOB and any other pain.    Past Medical History  Diagnosis Date  . URI (upper respiratory infection)   . Sinusitis     Past Surgical History  Procedure Laterality Date  . Tubal ligation      Family History  Problem Relation Age of Onset  . Diabetes Other     History  Substance Use Topics  . Smoking status: Current Every Day Smoker -- 0.50 packs/day for 4 years    Types: Cigarettes  . Smokeless tobacco: Never Used  . Alcohol Use: No    OB History   Grav Para Term Preterm Abortions TAB SAB Ect Mult Living   3 3 3       3       Review of Systems 10 Systems reviewed and all are negative for acute change except as noted in the HPI.   Allergies  Penicillins  Home Medications   Current Outpatient Rx  Name  Route  Sig  Dispense  Refill  . azithromycin (ZITHROMAX Z-PAK) 250 MG tablet      Take two tablets on day one, then one tab qd days 2-5   6 tablet   0   . calcium carbonate (TUMS - DOSED IN MG ELEMENTAL CALCIUM) 500 MG  chewable tablet   Oral   Chew 1 tablet by mouth as needed for heartburn.         . Multiple Vitamin (MULTIVITAMIN WITH MINERALS) TABS   Oral   Take 1 tablet by mouth every morning.         . ondansetron (ZOFRAN) 4 MG tablet   Oral   Take 1 tablet (4 mg total) by mouth every 6 (six) hours.   15 tablet   0   . Phenyleph-Doxylamine-DM-APAP (TYLENOL COLD MULTI-SYMPTOM) 5-6.25-10-325 MG/15ML LIQD   Oral   Take 15-30 mLs by mouth once as needed (for cold and sinus symptoms).         . ranitidine (ZANTAC) 150 MG tablet   Oral   Take 1 tablet (150 mg total) by mouth 2 (two) times daily.   60 tablet   0     BP 114/78  Pulse 84  Temp(Src) 98.2 F (36.8 C) (Oral)  Resp 17  Ht 5' (1.524 m)  Wt 144 lb (65.318 kg)  BMI 28.12 kg/m2  SpO2 100%  LMP 05/22/2013  Physical Exam  Nursing note and vitals reviewed. Constitutional: She is oriented to person, place, and time. She appears well-developed and well-nourished.  HENT:  Head:  Normocephalic and atraumatic.  Eyes: Conjunctivae and EOM are normal. Pupils are equal, round, and reactive to light.  Neck: Normal range of motion. Neck supple.  Cardiovascular: Normal rate, regular rhythm and normal heart sounds.   Pulmonary/Chest: Effort normal and breath sounds normal.  Abdominal: Soft. Bowel sounds are normal.  Musculoskeletal: Normal range of motion.  Tenderness to left lower back   Neurological: She is alert and oriented to person, place, and time.  Skin: Skin is warm and dry.  Psychiatric: She has a normal mood and affect.    ED Course  Procedures (including critical care time) DIAGNOSTIC STUDIES: Oxygen Saturation is 100% on room air, normal by my interpretation.    COORDINATION OF CARE: 4:06 PM Discussed ED treatment with pt and pt agrees to UA and pain medication. DDx UTI.  4:23 PM Ordered:  Medications  ibuprofen (ADVIL,MOTRIN) tablet 800 mg (not administered)  acetaminophen (TYLENOL) tablet 1,000 mg (not  administered)  nitrofurantoin (macrocrystal-monohydrate) (MACROBID) capsule 100 mg (not administered)   Results for orders placed during the hospital encounter of 06/01/13  URINALYSIS, ROUTINE W REFLEX MICROSCOPIC      Result Value Range   Color, Urine ORANGE (*) YELLOW   APPearance CLEAR  CLEAR   Specific Gravity, Urine >1.030 (*) 1.005 - 1.030   pH 5.0  5.0 - 8.0   Glucose, UA 100 (*) NEGATIVE mg/dL   Hgb urine dipstick NEGATIVE  NEGATIVE   Bilirubin Urine SMALL (*) NEGATIVE   Ketones, ur NEGATIVE  NEGATIVE mg/dL   Protein, ur 30 (*) NEGATIVE mg/dL   Urobilinogen, UA 2.0 (*) 0.0 - 1.0 mg/dL   Nitrite POSITIVE (*) NEGATIVE   Leukocytes, UA NEGATIVE  NEGATIVE  URINE MICROSCOPIC-ADD ON      Result Value Range   Squamous Epithelial / LPF FEW (*) RARE   WBC, UA 3-6  <3 WBC/hpf   RBC / HPF 0-2  <3 RBC/hpf   Bacteria, UA RARE  RARE   No results found.        No results found.   No diagnosis found.    MDM  Patient is nontoxic. Rx Macrobid for urinary tract infection. Also Naprosyn 500 mg #30 and Flexeril 10 mg #20      I personally performed the services described in this documentation, which was scribed in my presence. The recorded information has been reviewed and is accurate.    Donnetta Hutching, MD 06/01/13 [phone]

## 2013-10-05 ENCOUNTER — Emergency Department (HOSPITAL_COMMUNITY): Payer: Self-pay

## 2013-10-05 ENCOUNTER — Encounter (HOSPITAL_COMMUNITY): Payer: Self-pay | Admitting: Emergency Medicine

## 2013-10-05 ENCOUNTER — Emergency Department (HOSPITAL_COMMUNITY)
Admission: EM | Admit: 2013-10-05 | Discharge: 2013-10-05 | Disposition: A | Discharge status: Home / Self Care | Payer: Self-pay | Attending: Emergency Medicine | Admitting: Emergency Medicine

## 2013-10-05 DIAGNOSIS — Z3202 Encounter for pregnancy test, result negative: Secondary | ICD-10-CM | POA: Insufficient documentation

## 2013-10-05 DIAGNOSIS — R300 Dysuria: Secondary | ICD-10-CM | POA: Insufficient documentation

## 2013-10-05 DIAGNOSIS — Z8709 Personal history of other diseases of the respiratory system: Secondary | ICD-10-CM | POA: Insufficient documentation

## 2013-10-05 DIAGNOSIS — R109 Unspecified abdominal pain: Secondary | ICD-10-CM

## 2013-10-05 DIAGNOSIS — Z79899 Other long term (current) drug therapy: Secondary | ICD-10-CM | POA: Insufficient documentation

## 2013-10-05 DIAGNOSIS — R1031 Right lower quadrant pain: Secondary | ICD-10-CM | POA: Insufficient documentation

## 2013-10-05 DIAGNOSIS — F17200 Nicotine dependence, unspecified, uncomplicated: Secondary | ICD-10-CM | POA: Insufficient documentation

## 2013-10-05 DIAGNOSIS — Z9851 Tubal ligation status: Secondary | ICD-10-CM | POA: Insufficient documentation

## 2013-10-05 DIAGNOSIS — Z880 Allergy status to penicillin: Secondary | ICD-10-CM | POA: Insufficient documentation

## 2013-10-05 LAB — COMPREHENSIVE METABOLIC PANEL
ALT: 8 U/L (ref 0–35)
AST: 13 U/L (ref 0–37)
Calcium: 9.6 mg/dL (ref 8.4–10.5)
Creatinine, Ser: 0.61 mg/dL (ref 0.50–1.10)
GFR calc non Af Amer: >90 mL/min (ref >90)
Sodium: 139 mEq/L (ref 135–145)
Total Protein: 7.3 g/dL (ref 6.0–8.3)

## 2013-10-05 LAB — URINALYSIS, ROUTINE W REFLEX MICROSCOPIC
Bilirubin Urine: NEGATIVE
Hgb urine dipstick: NEGATIVE
Nitrite: NEGATIVE
Protein, ur: NEGATIVE mg/dL
Specific Gravity, Urine: 1.015 (ref 1.005–1.030)
Urobilinogen, UA: 0.2 mg/dL (ref 0.0–1.0)

## 2013-10-05 LAB — CBC WITH DIFFERENTIAL/PLATELET
Basophils Absolute: 0 10*3/uL (ref 0.0–0.1)
Basophils Relative: 0 % (ref 0–1)
Eosinophils Absolute: 0.1 10*3/uL (ref 0.0–0.7)
Eosinophils Relative: 1 % (ref 0–5)
HCT: 38.4 % (ref 36.0–46.0)
MCH: 28 pg (ref 26.0–34.0)
MCHC: 34.1 g/dL (ref 30.0–36.0)
MCV: 82.1 fL (ref 78.0–100.0)
Monocytes Absolute: 0.7 10*3/uL (ref 0.1–1.0)
Platelets: 358 10*3/uL (ref 150–400)
RDW: 14.9 % (ref 11.5–15.5)
WBC: 15.6 10*3/uL — ABNORMAL HIGH (ref 4.0–10.5)

## 2013-10-05 MED ORDER — TRAMADOL HCL 50 MG PO TABS: 50.0000 mg | ORAL_TABLET | Freq: Four times a day (QID) | ORAL | Status: DC | PRN

## 2013-10-05 MED ORDER — IOHEXOL 300 MG/ML  SOLN
50.0000 mL | Freq: Once | INTRAMUSCULAR | Status: AC | PRN
  Administered 2013-10-05: 50 mL via ORAL

## 2013-10-05 MED ORDER — CIPROFLOXACIN HCL 500 MG PO TABS: 500.0000 mg | ORAL_TABLET | Freq: Two times a day (BID) | ORAL | Status: DC

## 2013-10-05 MED ORDER — SODIUM CHLORIDE 0.9 % IV BOLUS (SEPSIS)
500.0000 mL | Freq: Once | INTRAVENOUS | Status: AC
  Administered 2013-10-05: 500 mL via INTRAVENOUS

## 2013-10-05 MED ORDER — IOHEXOL 300 MG/ML  SOLN
100.0000 mL | Freq: Once | INTRAMUSCULAR | Status: AC | PRN
  Administered 2013-10-05: 100 mL via INTRAVENOUS

## 2013-10-05 NOTE — ED Provider Notes (Signed)
CSN: [id number]     Arrival date & time 10/05/13  0705 History  This chart was scribed for Jacqueline Lennert, MD by Jacqueline [name], ED scribe.  This [name] was seen in room APA03/APA03 and Jacqueline [name]'s care was started at 7:31 AM.  Chief Complaint  [name] presents with  . Abdominal Pain    [name] is a 37 y.o. female presenting with abdominal pain. [facility] history is provided by Jacqueline [name]. No language interpreter was used.  Abdominal Pain Pain location:  RLQ Pain quality: pressure   Pain radiates to:  Does not radiate Pain severity:  Severe Onset quality:  Gradual Duration: Began last night. Timing:  Constant Progression:  Worsening Chronicity:  New Worsened by:  Palpation Ineffective treatments: gas pill. Associated symptoms: no chest pain, no cough, no diarrhea, no dysuria, no fatigue and no hematuria   Associated symptoms comment:  Difficulty urinating Risk factors: has not had multiple surgeries     HPI Comments: Jacqueline [name] is a 37 y.o. female who presents to [facility] complaining of gradual-onset, progressively-worsening RLQ abdominal pain that began last night.  Pt states she ate pinto beans for dinner before onset and she initially attributed her pain to gas.  She took a gas pill but awoke at 2:30 AM with her pain much more severe.  She attempted to urinate but felt unable to void her bladder.  Pt states "it feels like I have to pee and I can't, and it's a lot of pressure and it hurts."  Pain is worsened by pressing on [facility] area.  It is not worsened by urination.. Pt has h/o tubal ligation.  She denies any other abdominal surgeries.     Past Medical History  Diagnosis Date  . URI (upper respiratory infection)   . Sinusitis     Past Surgical History  Procedure Laterality Date  . Tubal ligation      Family History  Problem Relation Age of Onset  . Diabetes Other     History  Substance Use Topics  . Smoking status: Current Every Day Smoker -- 0.50  packs/day for 4 years    Types: Cigarettes  . Smokeless tobacco: Never Used  . Alcohol Use: No    OB History   Grav Para Term Preterm Abortions TAB SAB Ect Mult Living   3 3 3       3       Review of Systems  Constitutional: Negative for appetite change and fatigue.  HENT: Negative for congestion, ear discharge and sinus pressure.   Eyes: Negative for discharge.  Respiratory: Negative for cough.   Cardiovascular: Negative for chest pain.  Gastrointestinal: Positive for abdominal pain. Negative for diarrhea.  Genitourinary: Positive for difficulty urinating. Negative for dysuria, frequency and hematuria.  Musculoskeletal: Negative for back pain.  Skin: Negative for rash.  Neurological: Negative for seizures and headaches.  Psychiatric/Behavioral: Negative for hallucinations.     Allergies  Penicillins  Home Medications   Current Outpatient Rx  Name  Route  Sig  Dispense  Refill  . acetaminophen (TYLENOL) 500 MG tablet   Oral   Take 1,000 mg by mouth every 6 (six) hours as needed for pain.         . cyclobenzaprine (FLEXERIL) 10 MG tablet   Oral   Take 1 tablet (10 mg total) by mouth 2 (two) times daily as needed for muscle spasms.   20 tablet   0   . naproxen (NAPROSYN) 500 MG tablet  Oral   Take 1 tablet (500 mg total) by mouth 2 (two) times daily.   30 tablet   0   . nitrofurantoin, macrocrystal-monohydrate, (MACROBID) 100 MG capsule   Oral   Take 1 capsule (100 mg total) by mouth 2 (two) times daily. X 7 days   14 capsule   0   . ondansetron (ZOFRAN) 4 MG tablet   Oral   Take 1 tablet (4 mg total) by mouth every 6 (six) hours.   15 tablet   0   . ranitidine (ZANTAC) 150 MG tablet   Oral   Take 1 tablet (150 mg total) by mouth 2 (two) times daily.   60 tablet   0    BP 117/68  Pulse 113  Temp(Src) 98.1 F (36.7 C) (Oral)  Resp 18  Ht 5' (1.524 m)  Wt 160 lb (72.576 kg)  BMI 31.25 kg/m2  SpO2 100%  LMP 10/01/2013  Physical Exam   Nursing note and vitals reviewed. Constitutional: She is oriented to person, place, and time. She appears well-developed.  HENT:  Head: Normocephalic.  Eyes: Conjunctivae and EOM are normal. No scleral icterus.  Neck: Neck supple. No thyromegaly present.  Cardiovascular: Normal rate and regular rhythm.  Exam reveals no gallop and no friction rub.   No murmur heard. Pulmonary/Chest: No stridor. She has no wheezes. She has no rales. She exhibits no tenderness.  Abdominal: She exhibits no distension. There is tenderness. There is no rebound.  Moderate RLQ tenderness  Musculoskeletal: Normal range of motion. She exhibits no edema.  Lymphadenopathy:    She has no cervical adenopathy.  Neurological: She is oriented to person, place, and time. She exhibits normal muscle tone. Coordination normal.  Skin: No rash noted. No erythema.  Psychiatric: She has a normal mood and affect. Her behavior is normal.    ED Course  Procedures (including critical care time)  DIAGNOSTIC STUDIES: Oxygen Saturation is 100% on room air, normal by my interpretation.    COORDINATION OF CARE: 7:33 AM-Discussed treatment plan which includes UA and labs with pt at bedside and pt agreed to plan.   8:50 AM: Informed pt that UA is unremarkable.  Discussed treatment plan which includes CT-scan to rule out appendicitis with pt at bedside and pt agreed to plan.   11:50 AM-Informed pt that imaging rules out appendicitis but shows mild hydronephrosis and hydroureter.  Discussed treatment plan which includes antibiotics, urine culture, and urology f/u with pt at bedside and pt agreed to plan.      Labs Review Labs Reviewed  CBC WITH DIFFERENTIAL - Abnormal; Notable for [facility] following:    WBC 15.6 (*)    Neutrophils Relative % 82 (*)    Neutro Abs 12.8 (*)    All other components within normal limits  URINALYSIS, ROUTINE W REFLEX MICROSCOPIC  COMPREHENSIVE METABOLIC PANEL  POCT PREGNANCY, URINE    Imaging  Review Ct Abdomen Pelvis W Contrast  10/05/2013   CLINICAL DATA:  Right upper quadrant pain, tubal ligation  EXAM: CT ABDOMEN AND PELVIS WITH CONTRAST  TECHNIQUE: Multidetector CT imaging of [facility] abdomen and pelvis was performed using [facility] standard protocol following bolus administration of intravenous contrast.  CONTRAST:  50mL OMNIPAQUE IOHEXOL 300 MG/ML SOLN, OMNIPAQUE IOHEXOL 300 MG/ML SOLN  COMPARISON:  None.  FINDINGS: Lung bases are unremarkable. Sagittal images of [facility] spine are unremarkable. Liver, pancreas, spleen and adrenal glands are unremarkable. No calcified gallstones are noted within gallbladder. Kidneys are symmetrical in enhancement.  Minimal right hydronephrosis and proximal right hydroureter. No right perinephric stranding. Right urinary tract inflammation or a ureteral stricture cannot be excluded. Clinical correlation is necessary. No definite calcified ureteral calculi are identified bilaterally.  Oral contrast material was given to Jacqueline [name]. No small bowel obstruction. No ascites or free air. No adenopathy.  Abdominal aorta is unremarkable. No pericecal inflammation. [facility] terminal ileum is unremarkable. Normal appendix is clearly visualized in axial image 44. Bilateral tubal ligation surgical clips are noted.  There is mild fullness of cervix of [facility] uterus. Clinical correlation is necessary. No pelvic ascites or free air. [facility] urinary bladder is unremarkable. No inguinal adenopathy. No thickening of urinary bladder wall.  IMPRESSION: 1. Subtle mild hydronephrosis and minimal proximal right hydroureter. No definite calcified ureteral calculi are noted. Mild right renal tract inflammation or a right ureteral stricture cannot be excluded. Clinical correlation is necessary. No right perinephric stranding. 2. Bilateral tubal ligation surgical clips are noted. Nonspecific mild prominence of cervix of [facility] uterus. Clinical correlation is necessary. 3. No pericecal inflammation. Normal appendix.  4. No calcified gallstones are noted within gallbladder.   Electronically Signed   By: Natasha Mead M.D.   On: 10/05/2013 11:22     MDM  No diagnosis found. Possible urethral stricture.  Pt to follow up with urology   [facility] chart was scribed for me under my direct supervision.  I personally performed [facility] history, physical, and medical decision making and all procedures in [facility] evaluation of this [name].[facility] Lennert, MD 10/05/13 [phone]

## 2013-10-05 NOTE — Progress Notes (Signed)
ED/CM noted patient did not have health insurance and/or PCP listed in the computer.  Patient was given the [place] resource handout with information on the clinics, food pantries, and the handout for new health insurance sign-up.  Patient expressed appreciation for this. 

## 2013-10-05 NOTE — ED Notes (Signed)
Pt co lower right abdominal pain, started last night, "feels like I have to pee".

## 2013-10-06 LAB — URINE CULTURE: Colony Count: 70000

## 2014-03-14 ENCOUNTER — Encounter (HOSPITAL_COMMUNITY): Payer: Self-pay | Admitting: Emergency Medicine

## 2014-03-14 ENCOUNTER — Emergency Department (HOSPITAL_COMMUNITY)
Admission: EM | Admit: 2014-03-14 | Discharge: 2014-03-14 | Disposition: A | Discharge status: Home / Self Care | Payer: Self-pay | Attending: Emergency Medicine | Admitting: Emergency Medicine

## 2014-03-14 DIAGNOSIS — Z3202 Encounter for pregnancy test, result negative: Secondary | ICD-10-CM | POA: Insufficient documentation

## 2014-03-14 DIAGNOSIS — R197 Diarrhea, unspecified: Secondary | ICD-10-CM | POA: Insufficient documentation

## 2014-03-14 DIAGNOSIS — Z79899 Other long term (current) drug therapy: Secondary | ICD-10-CM | POA: Insufficient documentation

## 2014-03-14 DIAGNOSIS — R109 Unspecified abdominal pain: Secondary | ICD-10-CM | POA: Insufficient documentation

## 2014-03-14 DIAGNOSIS — R110 Nausea: Secondary | ICD-10-CM

## 2014-03-14 DIAGNOSIS — R112 Nausea with vomiting, unspecified: Secondary | ICD-10-CM | POA: Insufficient documentation

## 2014-03-14 DIAGNOSIS — F17200 Nicotine dependence, unspecified, uncomplicated: Secondary | ICD-10-CM | POA: Insufficient documentation

## 2014-03-14 DIAGNOSIS — Z8709 Personal history of other diseases of the respiratory system: Secondary | ICD-10-CM | POA: Insufficient documentation

## 2014-03-14 DIAGNOSIS — Z880 Allergy status to penicillin: Secondary | ICD-10-CM | POA: Insufficient documentation

## 2014-03-14 LAB — BASIC METABOLIC PANEL
BUN: 7 mg/dL (ref 6–23)
CHLORIDE: 105 meq/L (ref 96–112)
CO2: 26 meq/L (ref 19–32)
Calcium: 8.8 mg/dL (ref 8.4–10.5)
Creatinine, Ser: 0.6 mg/dL (ref 0.50–1.10)
GFR calc Af Amer: >90 mL/min (ref >90)
GFR calc non Af Amer: >90 mL/min (ref >90)
Glucose, Bld: 89 mg/dL (ref 70–99)
POTASSIUM: 3.6 meq/L — AB (ref 3.7–5.3)
SODIUM: 141 meq/L (ref 137–147)

## 2014-03-14 LAB — CBC WITH DIFFERENTIAL/PLATELET
BASOS ABS: 0 10*3/uL (ref 0.0–0.1)
Basophils Relative: 0 % (ref 0–1)
Eosinophils Absolute: 0.1 10*3/uL (ref 0.0–0.7)
Eosinophils Relative: 1 % (ref 0–5)
HEMATOCRIT: 36.5 % (ref 36.0–46.0)
Hemoglobin: 12.5 g/dL (ref 12.0–15.0)
LYMPHS PCT: 27 % (ref 12–46)
Lymphs Abs: 2 10*3/uL (ref 0.7–4.0)
MCH: 27.9 pg (ref 26.0–34.0)
MCHC: 34.2 g/dL (ref 30.0–36.0)
MCV: 81.5 fL (ref 78.0–100.0)
MONO ABS: 0.5 10*3/uL (ref 0.1–1.0)
Monocytes Relative: 6 % (ref 3–12)
NEUTROS ABS: 5.1 10*3/uL (ref 1.7–7.7)
NEUTROS PCT: 66 % (ref 43–77)
PLATELETS: 313 10*3/uL (ref 150–400)
RBC: 4.48 MIL/uL (ref 3.87–5.11)
RDW: 14.9 % (ref 11.5–15.5)
WBC: 7.7 10*3/uL (ref 4.0–10.5)

## 2014-03-14 LAB — URINALYSIS, ROUTINE W REFLEX MICROSCOPIC
Bilirubin Urine: NEGATIVE
Glucose, UA: NEGATIVE mg/dL
HGB URINE DIPSTICK: NEGATIVE
Ketones, ur: NEGATIVE mg/dL
Nitrite: NEGATIVE
Protein, ur: NEGATIVE mg/dL
UROBILINOGEN UA: 0.2 mg/dL (ref 0.0–1.0)
pH: 6 (ref 5.0–8.0)

## 2014-03-14 LAB — URINE MICROSCOPIC-ADD ON

## 2014-03-14 LAB — PREGNANCY, URINE: PREG TEST UR: NEGATIVE

## 2014-03-14 MED ORDER — PROMETHAZINE HCL 25 MG PO TABS: 25.0000 mg | ORAL_TABLET | Freq: Four times a day (QID) | ORAL | Status: DC | PRN

## 2014-03-14 MED ORDER — ONDANSETRON 8 MG PO TBDP
8.0000 mg | ORAL_TABLET | Freq: Once | ORAL | Status: AC
  Administered 2014-03-14: 8 mg via ORAL
  Filled 2014-03-14: qty 1

## 2014-03-14 MED ORDER — ONDANSETRON HCL 8 MG PO TABS: 8.0000 mg | ORAL_TABLET | Freq: Three times a day (TID) | ORAL | Status: DC | PRN

## 2014-03-14 NOTE — ED Provider Notes (Signed)
CSN: [id number]     Arrival date & time 03/14/14  1341 History   First MD Initiated Contact with Patient 03/14/14 1425     Chief Complaint  Patient presents with  . Nausea      Patient is a 38 y.o. female presenting with diarrhea. The history is provided by the patient.  Diarrhea Quality:  Watery Severity:  Moderate Duration:  1 day Timing:  Intermittent Progression:  Worsening Relieved by:  Nothing Worsened by:  Liquids Associated symptoms: vomiting   Associated symptoms: no fever   Associated symptoms comment:  Abdominal cramping  Risk factors: no travel to endemic areas    Pt reports one episode of nonbloody vomiting last week Since that time she has had persistent nausea.  She reports nonbloody diarrhea since yesterday She reports abd cramping  Past Medical History  Diagnosis Date  . URI (upper respiratory infection)   . Sinusitis    Past Surgical History  Procedure Laterality Date  . Tubal ligation     Family History  Problem Relation Age of Onset  . Diabetes Other    History  Substance Use Topics  . Smoking status: Current Every Day Smoker -- 0.50 packs/day for 4 years    Types: Cigarettes  . Smokeless tobacco: Never Used  . Alcohol Use: No   OB History   Grav Para Term Preterm Abortions TAB SAB Ect Mult Living   3 3 3       3      Review of Systems  Constitutional: Negative for fever.  Respiratory: Negative for cough.   Gastrointestinal: Positive for nausea, vomiting and diarrhea. Negative for blood in stool.      Allergies  Penicillins  Home Medications   Current Outpatient Rx  Name  Route  Sig  Dispense  Refill  . calcium carbonate (TUMS - DOSED IN MG ELEMENTAL CALCIUM) 500 MG chewable tablet   Oral   Chew 1 tablet by mouth daily as needed for indigestion or heartburn.         . ranitidine (ZANTAC) 150 MG tablet   Oral   Take 150 mg by mouth 2 (two) times daily as needed for heartburn.         . ondansetron (ZOFRAN) 8 MG tablet   Oral   Take 1 tablet (8 mg total) by mouth every 8 (eight) hours as needed for nausea.   10 tablet   0   . promethazine (PHENERGAN) 25 MG tablet   Oral   Take 1 tablet (25 mg total) by mouth every 6 (six) hours as needed for nausea or vomiting.   30 tablet   0    BP 109/70  Pulse 71  Temp(Src) 98 F (36.7 C) (Oral)  Resp 18  Ht 5' (1.524 m)  Wt 154 lb (69.854 kg)  BMI 30.08 kg/m2  SpO2 97%  LMP 02/26/2014 Physical Exam CONSTITUTIONAL: Well developed/well nourished HEAD: Normocephalic/atraumatic EYES: EOMI/PERRL, no icterus ENMT: Mucous membranes moist NECK: supple no meningeal signs SPINE:entire spine nontender CV: S1/S2 noted, no murmurs/rubs/gallops noted LUNGS: Lungs are clear to auscultation bilaterally, no apparent distress ABDOMEN: soft, nontender, no rebound or guarding GU:no cva tenderness NEURO: Pt is awake/alert, moves all extremitiesx4 EXTREMITIES: pulses normal, full ROM SKIN: warm, color normal PSYCH: no abnormalities of mood noted  ED Course  Procedures  Labs Review Labs Reviewed  URINALYSIS, ROUTINE W REFLEX MICROSCOPIC - Abnormal; Notable for the following:    Specific Gravity, Urine >1.030 (*)    Leukocytes, UA  TRACE (*)    All other components within normal limits  BASIC METABOLIC PANEL - Abnormal; Notable for the following:    Potassium 3.6 (*)    All other components within normal limits  URINE MICROSCOPIC-ADD ON - Abnormal; Notable for the following:    Squamous Epithelial / LPF FEW (*)    Bacteria, UA FEW (*)    All other components within normal limits  PREGNANCY, URINE  CBC WITH DIFFERENTIAL   Pt tolerating  PO and feels improved, stable for d/c home MDM   Final diagnoses:  Nausea  Diarrhea    Nursing notes including past medical history and social history reviewed and considered in documentation Labs/vital reviewed and considered     Joya Gaskins, MD 03/14/14 1615

## 2014-03-14 NOTE — ED Notes (Signed)
Pt c/o nausea x 1 week.  Reports vomited late wed and early thurs but none since.  Reports has had diarrhea since yesterday.  Reports intermittent abd cramping.

## 2014-08-10 ENCOUNTER — Emergency Department (HOSPITAL_COMMUNITY)
Admission: EM | Admit: 2014-08-10 | Discharge: 2014-08-10 | Disposition: A | Discharge status: Home / Self Care | Payer: Self-pay | Attending: Emergency Medicine | Admitting: Emergency Medicine

## 2014-08-10 ENCOUNTER — Encounter (HOSPITAL_COMMUNITY): Payer: Self-pay | Admitting: Emergency Medicine

## 2014-08-10 DIAGNOSIS — Z8709 Personal history of other diseases of the respiratory system: Secondary | ICD-10-CM | POA: Insufficient documentation

## 2014-08-10 DIAGNOSIS — Z87440 Personal history of urinary (tract) infections: Secondary | ICD-10-CM | POA: Insufficient documentation

## 2014-08-10 DIAGNOSIS — Z87891 Personal history of nicotine dependence: Secondary | ICD-10-CM | POA: Insufficient documentation

## 2014-08-10 DIAGNOSIS — M545 Low back pain, unspecified: Secondary | ICD-10-CM | POA: Insufficient documentation

## 2014-08-10 DIAGNOSIS — R300 Dysuria: Secondary | ICD-10-CM | POA: Insufficient documentation

## 2014-08-10 DIAGNOSIS — Z880 Allergy status to penicillin: Secondary | ICD-10-CM | POA: Insufficient documentation

## 2014-08-10 DIAGNOSIS — M549 Dorsalgia, unspecified: Secondary | ICD-10-CM | POA: Insufficient documentation

## 2014-08-10 DIAGNOSIS — Z87442 Personal history of urinary calculi: Secondary | ICD-10-CM | POA: Insufficient documentation

## 2014-08-10 DIAGNOSIS — Z79899 Other long term (current) drug therapy: Secondary | ICD-10-CM | POA: Insufficient documentation

## 2014-08-10 HISTORY — DX: Urinary tract infection, site not specified: N39.0

## 2014-08-10 HISTORY — DX: Calculus of kidney: N20.0

## 2014-08-10 LAB — URINALYSIS, ROUTINE W REFLEX MICROSCOPIC
GLUCOSE, UA: NEGATIVE mg/dL
HGB URINE DIPSTICK: NEGATIVE
Ketones, ur: NEGATIVE mg/dL
Leukocytes, UA: NEGATIVE
Nitrite: NEGATIVE
Protein, ur: NEGATIVE mg/dL
Specific Gravity, Urine: 1.025 (ref 1.005–1.030)
Urobilinogen, UA: 0.2 mg/dL (ref 0.0–1.0)
pH: 6 (ref 5.0–8.0)

## 2014-08-10 MED ORDER — TRAMADOL HCL 50 MG PO TABS: ORAL_TABLET | ORAL | Status: DC

## 2014-08-10 MED ORDER — DICLOFENAC SODIUM 75 MG PO TBEC: 75.0000 mg | DELAYED_RELEASE_TABLET | Freq: Two times a day (BID) | ORAL | Status: DC

## 2014-08-10 NOTE — Discharge Instructions (Signed)
Your vital signs are well within normal limits. Your urine analysis is negative for urinary tract infection, or evidence of kidney stone. Please rest your back is much as possible. Please apply heat. Please use medications as prescribed. Ultram may cause drowsiness, please use with caution. Please take both medications with food. Back Pain, Adult Back pain is very common. The pain often gets better over time. The cause of back pain is usually not dangerous. Most people can learn to manage their back pain on their own.  HOME CARE   Stay active. Start with short walks on flat ground if you can. Try to walk farther each day.  Do not sit, drive, or stand in one place for more than 30 minutes. Do not stay in bed.  Do not avoid exercise or work. Activity can help your back heal faster.  Be careful when you bend or lift an object. Bend at your knees, keep the object close to you, and do not twist.  Sleep on a firm mattress. Lie on your side, and bend your knees. If you lie on your back, put a pillow under your knees.  Only take medicines as told by your doctor.  Put ice on the injured area.  Put ice in a plastic bag.  Place a towel between your skin and the bag.  Leave the ice on for 15-20 minutes, 03-04 times a day for the first 2 to 3 days. After that, you can switch between ice and heat packs.  Ask your doctor about back exercises or massage.  Avoid feeling anxious or stressed. Find good ways to deal with stress, such as exercise. GET HELP RIGHT AWAY IF:   Your pain does not go away with rest or medicine.  Your pain does not go away in 1 week.  You have new problems.  You do not feel well.  The pain spreads into your legs.  You cannot control when you poop (bowel movement) or pee (urinate).  Your arms or legs feel weak or lose feeling (numbness).  You feel sick to your stomach (nauseous) or throw up (vomit).  You have belly (abdominal) pain.  You feel like you may pass out  (faint). MAKE SURE YOU:   Understand these instructions.  Will watch your condition.  Will get help right away if you are not doing well or get worse. Document Released: 06/02/2008 Document Revised: 03/08/2012 Document Reviewed: 04/18/2014 [facility] Patient Information 2015 [place], [place]. This information is not intended to replace advice given to you by your health care provider. Make sure you discuss any questions you have with your health care provider.

## 2014-08-10 NOTE — ED Notes (Signed)
Pt reports bilateral lower back pain that started 2-3 days ago. Pt reports dysuria that started on Monday. Pt reports hx of kidney stones and uti in the past. nad noted. Pt denies and n/v/d.

## 2014-08-10 NOTE — ED Notes (Signed)
PA at bedside.

## 2014-08-10 NOTE — ED Provider Notes (Signed)
Medical screening examination/treatment/procedure(s) were performed by non-physician practitioner and as supervising physician I was immediately available for consultation/collaboration.   EKG Interpretation None       Donnetta HutchingBrian Makennah Omura, MD 08/10/14 1525

## 2014-08-10 NOTE — ED Provider Notes (Signed)
CSN: [id number]     Arrival date & time 08/10/14  0735 History   First MD Initiated Contact with Patient 08/10/14 [phone]     Chief Complaint  Patient presents with  . Back Pain     (Consider location/radiation/quality/duration/timing/severity/associated sxs/prior Treatment) Patient is a 38 y.o. female presenting with dysuria. The history is provided by the patient.  Dysuria Pain quality:  Aching Pain severity:  Moderate Onset quality:  Gradual Duration:  3 days Timing:  Intermittent Progression:  Worsening Chronicity:  Recurrent Recent UTI: less than 1 year ago.   Relieved by:  Nothing Worsened by:  Nothing tried Ineffective treatments:  None tried Urinary symptoms: hesitancy   Urinary symptoms: no bladder incontinence   Associated symptoms: no abdominal pain, no fever and no vomiting   Risk factors: sexually active   Risk factors: no hx of pyelonephritis and no kidney transplant     Past Medical History  Diagnosis Date  . URI (upper respiratory infection)   . Sinusitis   . Kidney stones   . UTI (lower urinary tract infection)    Past Surgical History  Procedure Laterality Date  . Tubal ligation     Family History  Problem Relation Age of Onset  . Diabetes Other    History  Substance Use Topics  . Smoking status: Former Smoker -- 0.50 packs/day for 4 years    Types: Cigarettes  . Smokeless tobacco: Never Used  . Alcohol Use: No   OB History   Grav Para Term Preterm Abortions TAB SAB Ect Mult Living   3 3 3       3      Review of Systems  Constitutional: Negative for fever and activity change.       All ROS Neg except as noted in HPI  HENT: Negative for nosebleeds.   Eyes: Negative for photophobia and discharge.  Respiratory: Negative for cough, shortness of breath and wheezing.   Cardiovascular: Negative for chest pain and palpitations.  Gastrointestinal: Negative for vomiting, abdominal pain and blood in stool.  Genitourinary: Positive for dysuria.  Negative for frequency and hematuria.  Musculoskeletal: Positive for back pain. Negative for arthralgias and neck pain.  Skin: Negative.   Neurological: Negative for dizziness, seizures and speech difficulty.  Psychiatric/Behavioral: Negative for hallucinations and confusion.      Allergies  Penicillins  Home Medications   Prior to Admission medications   Medication Sig Start Date End Date Taking? Authorizing Provider  calcium carbonate (TUMS - DOSED IN MG ELEMENTAL CALCIUM) 500 MG chewable tablet Chew 1 tablet by mouth daily as needed for indigestion or heartburn.    Historical Provider, MD  ondansetron (ZOFRAN) 8 MG tablet Take 1 tablet (8 mg total) by mouth every 8 (eight) hours as needed for nausea. 03/14/14   Joya Gaskinsonald W Wickline, MD  promethazine (PHENERGAN) 25 MG tablet Take 1 tablet (25 mg total) by mouth every 6 (six) hours as needed for nausea or vomiting. 03/14/14   Joya Gaskinsonald W Wickline, MD  ranitidine (ZANTAC) 150 MG tablet Take 150 mg by mouth 2 (two) times daily as needed for heartburn.    Historical Provider, MD   BP 109/82  Pulse 81  Temp(Src) 97.9 F (36.6 C) (Oral)  Ht 5' (1.524 m)  Wt 145 lb (65.772 kg)  BMI 28.32 kg/m2  SpO2 100%  LMP 08/07/2014 Physical Exam  Nursing note and vitals reviewed. Constitutional: She is oriented to person, place, and time. She appears well-developed and well-nourished.  Non-toxic appearance.  HENT:  Head: Normocephalic.  Right Ear: Tympanic membrane and external ear normal.  Left Ear: Tympanic membrane and external ear normal.  Eyes: EOM and lids are normal. Pupils are equal, round, and reactive to light.  Neck: Normal range of motion. Neck supple. Carotid bruit is not present.  Cardiovascular: Normal rate, regular rhythm, normal heart sounds, intact distal pulses and normal pulses.   Pulmonary/Chest: Breath sounds normal. No respiratory distress.  Abdominal: Soft. Bowel sounds are normal. There is no tenderness. There is no guarding.   Mild left CVA tenderness.  Musculoskeletal: Normal range of motion.       Lumbar back: She exhibits tenderness and pain. She exhibits normal range of motion.       Back:  Lymphadenopathy:       Head (right side): No submandibular adenopathy present.       Head (left side): No submandibular adenopathy present.    She has no cervical adenopathy.  Neurological: She is alert and oriented to person, place, and time. She has normal strength. No cranial nerve deficit or sensory deficit.  Skin: Skin is warm and dry.  Psychiatric: She has a normal mood and affect. Her speech is normal.    ED Course  Procedures (including critical care time) Labs Review Labs Reviewed  URINALYSIS, ROUTINE W REFLEX MICROSCOPIC    Imaging Review No results found.   EKG Interpretation None      MDM Vital signs are well within normal limits. Urine analysis is negative for acute problems.   Patient is ambulatory with some soreness, but steady gait. Suspect the back problem is related to on both onset of menstrual cycle, and musculoskeletal related pain. Patient will be treated with diclofenac and Ultram. Patient is to followup with the adult medicine clinic, or health department.    Final diagnoses:  None    **I have reviewed nursing notes, vital signs, and all appropriate lab and imaging results for this patient.Kathie Dike, PA-C 08/10/14 [phone]

## 2014-08-20 ENCOUNTER — Encounter (HOSPITAL_COMMUNITY): Payer: Self-pay | Admitting: Emergency Medicine

## 2014-08-20 ENCOUNTER — Emergency Department (HOSPITAL_COMMUNITY)
Admission: EM | Admit: 2014-08-20 | Discharge: 2014-08-20 | Disposition: A | Discharge status: Home / Self Care | Payer: Self-pay | Attending: Emergency Medicine | Admitting: Emergency Medicine

## 2014-08-20 DIAGNOSIS — Z87440 Personal history of urinary (tract) infections: Secondary | ICD-10-CM | POA: Insufficient documentation

## 2014-08-20 DIAGNOSIS — M5430 Sciatica, unspecified side: Secondary | ICD-10-CM | POA: Insufficient documentation

## 2014-08-20 DIAGNOSIS — Z9851 Tubal ligation status: Secondary | ICD-10-CM | POA: Insufficient documentation

## 2014-08-20 DIAGNOSIS — R630 Anorexia: Secondary | ICD-10-CM | POA: Insufficient documentation

## 2014-08-20 DIAGNOSIS — Z791 Long term (current) use of non-steroidal anti-inflammatories (NSAID): Secondary | ICD-10-CM | POA: Insufficient documentation

## 2014-08-20 DIAGNOSIS — Z880 Allergy status to penicillin: Secondary | ICD-10-CM | POA: Insufficient documentation

## 2014-08-20 DIAGNOSIS — Z87891 Personal history of nicotine dependence: Secondary | ICD-10-CM | POA: Insufficient documentation

## 2014-08-20 DIAGNOSIS — R109 Unspecified abdominal pain: Secondary | ICD-10-CM | POA: Insufficient documentation

## 2014-08-20 DIAGNOSIS — N898 Other specified noninflammatory disorders of vagina: Secondary | ICD-10-CM | POA: Insufficient documentation

## 2014-08-20 DIAGNOSIS — Z87442 Personal history of urinary calculi: Secondary | ICD-10-CM | POA: Insufficient documentation

## 2014-08-20 DIAGNOSIS — Z8709 Personal history of other diseases of the respiratory system: Secondary | ICD-10-CM | POA: Insufficient documentation

## 2014-08-20 DIAGNOSIS — M5432 Sciatica, left side: Secondary | ICD-10-CM

## 2014-08-20 DIAGNOSIS — R110 Nausea: Secondary | ICD-10-CM | POA: Insufficient documentation

## 2014-08-20 LAB — URINALYSIS, ROUTINE W REFLEX MICROSCOPIC
Bilirubin Urine: NEGATIVE
Glucose, UA: NEGATIVE mg/dL
Hgb urine dipstick: NEGATIVE
LEUKOCYTES UA: NEGATIVE
Nitrite: NEGATIVE
PH: 7 (ref 5.0–8.0)
Protein, ur: NEGATIVE mg/dL
SPECIFIC GRAVITY, URINE: 1.02 (ref 1.005–1.030)
Urobilinogen, UA: 0.2 mg/dL (ref 0.0–1.0)

## 2014-08-20 MED ORDER — CYCLOBENZAPRINE HCL 5 MG PO TABS: 5.0000 mg | ORAL_TABLET | Freq: Three times a day (TID) | ORAL | Status: DC | PRN

## 2014-08-20 MED ORDER — HYDROCODONE-ACETAMINOPHEN 5-325 MG PO TABS: 1.0000 | ORAL_TABLET | ORAL | Status: DC | PRN

## 2014-08-20 NOTE — ED Notes (Signed)
Patient with no complaints at this time. Respirations even and unlabored. Skin warm/dry. Discharge instructions reviewed with patient at this time. Patient given opportunity to voice concerns/ask questions. Patient discharged at this time and left Emergency Department with steady gait.   

## 2014-08-20 NOTE — ED Provider Notes (Signed)
Medical screening examination/treatment/procedure(s) were performed by non-physician practitioner and as supervising physician I was immediately available for consultation/collaboration.   EKG Interpretation None       Jammy Stlouis R. Monserratt Knezevic, MD 08/20/14 0832 

## 2014-08-20 NOTE — ED Notes (Signed)
Pt c/o left flank pain that started on 08/10/2014, was seen in er at that time, given antiinflammatory and pain medication with no improvement in symptoms, pain is associated with nausea this am,

## 2014-08-20 NOTE — ED Provider Notes (Signed)
CSN: [id number]     Arrival date & time 08/20/14  4098 History   First MD Initiated Contact with Patient 08/20/14 [phone]     Chief Complaint  Patient presents with  . Flank Pain     (Consider location/radiation/quality/duration/timing/severity/associated sxs/prior Treatment) Patient is a 38 y.o. female presenting with flank pain. The history is provided by the patient.  Flank Pain This is a new problem. The current episode started 1 to 4 weeks ago. The problem occurs constantly. The problem has been gradually worsening. Associated symptoms include anorexia and nausea. Pertinent negatives include no abdominal pain, chest pain, chills, coughing, fever, headaches, joint swelling, neck pain, rash, urinary symptoms or vomiting. The symptoms are aggravated by bending and standing. She has tried NSAIDs and oral narcotics for the symptoms. The treatment provided moderate relief.   Jacqueline [name] is a 38 y.o. female who presents to the ED with low back pain that started over a week ago. She was evaluated here in the ED and treated for pain and inflammation. She had thought at that time she may have a UTI because the symptoms were similar to ones in the past. She has been taking the Voltaren and Ultram with little relief. She denies dysuria, vaginal discharge or bleeding. She denies abdominal pain.   Past Medical History  Diagnosis Date  . URI (upper respiratory infection)   . Sinusitis   . Kidney stones   . UTI (lower urinary tract infection)    Past Surgical History  Procedure Laterality Date  . Tubal ligation     Family History  Problem Relation Age of Onset  . Diabetes Other    History  Substance Use Topics  . Smoking status: Former Smoker -- 0.50 packs/day for 4 years    Types: Cigarettes  . Smokeless tobacco: Never Used  . Alcohol Use: No   OB History   Grav Para Term Preterm Abortions TAB SAB Ect Mult Living   Review of Systems  Constitutional: Negative for  fever and chills.  HENT: Negative.   Eyes: Negative for visual disturbance.  Respiratory: Negative for cough, shortness of breath and wheezing.   Cardiovascular: Negative for chest pain and palpitations.  Gastrointestinal: Positive for nausea and anorexia. Negative for vomiting and abdominal pain.  Genitourinary: Positive for flank pain and vaginal discharge. Negative for dysuria, frequency, vaginal bleeding and pelvic pain.  Musculoskeletal: Positive for back pain. Negative for joint swelling and neck pain.  Skin: Negative for rash and wound.  Neurological: Negative for dizziness, syncope and headaches.  Psychiatric/Behavioral: Negative for confusion. The patient is not nervous/anxious.       Allergies  Penicillins  Home Medications   Prior to Admission medications   Medication Sig Start Date End Date Taking? Authorizing Provider  diclofenac (VOLTAREN) 75 MG EC tablet Take 1 tablet (75 mg total) by mouth 2 (two) times daily. 08/10/14   Kathie Dike, PA-C  naproxen sodium (ALEVE) 220 MG tablet Take 440 mg by mouth 2 (two) times daily with a meal.    Historical Provider, MD  traMADol (ULTRAM) 50 MG tablet 1 or 2 po q6h prn pain 08/10/14   Kathie Dike, PA-C   BP 99/72  Pulse 72  Resp 20  Ht 5' (1.524 m)  Wt 145 lb (65.772 kg)  BMI 28.32 kg/m2  SpO2 98%  LMP 08/07/2014 Physical Exam  Nursing note and vitals reviewed. Constitutional: She  is oriented to person, place, and time. She appears well-developed and well-nourished. No distress.  HENT:  Head: Normocephalic and atraumatic.  Right Ear: Tympanic membrane normal.  Left Ear: Tympanic membrane normal.  Nose: Nose normal.  Mouth/Throat: Uvula is midline, oropharynx is clear and moist and mucous membranes are normal.  Eyes: EOM are normal.  Neck: Normal range of motion. Neck supple.  Cardiovascular: Normal rate and regular rhythm.   Pulmonary/Chest: Effort normal. She has no wheezes. She has no rales.  Abdominal: Soft.  Bowel sounds are normal. There is no tenderness.  Musculoskeletal: Normal range of motion.       Lumbar back: She exhibits tenderness, pain and spasm. She exhibits normal pulse. Decreased range of motion: due to pain.       Back:  Pain with palpation over lower back and left sciatic nerve area.   Neurological: She is alert and oriented to person, place, and time. She has normal strength. No cranial nerve deficit or sensory deficit. Gait normal.  Reflex Scores:      Bicep reflexes are 2+ on the right side and 2+ on the left side.      Brachioradialis reflexes are 2+ on the right side and 2+ on the left side.      Patellar reflexes are 2+ on the right side and 2+ on the left side.      Achilles reflexes are 2+ on the right side and 2+ on the left side. Pedal pulses equal, adequate circulation. Ambulatory with steady gait no foot drag.   Skin: Skin is warm and dry.  Psychiatric: She has a normal mood and affect. Her behavior is normal.    ED Course  Procedures  Results for orders placed during the hospital encounter of 08/20/14 (from the past 24 hour(s))  URINALYSIS, ROUTINE W REFLEX MICROSCOPIC     Status: Abnormal   Collection Time    08/20/14  7:27 AM      Result Value Ref Range   Color, Urine YELLOW  YELLOW   APPearance CLOUDY (*) CLEAR   Specific Gravity, Urine 1.020  1.005 - 1.030   pH 7.0  5.0 - 8.0   Glucose, UA NEGATIVE  NEGATIVE mg/dL   Hgb urine dipstick NEGATIVE  NEGATIVE   Bilirubin Urine NEGATIVE  NEGATIVE   Ketones, ur TRACE (*) NEGATIVE mg/dL   Protein, ur NEGATIVE  NEGATIVE mg/dL   Urobilinogen, UA 0.2  0.0 - 1.0 mg/dL   Nitrite NEGATIVE  NEGATIVE   Leukocytes, UA NEGATIVE  NEGATIVE     MDM  38 y.o. female with low back pain that radiates to the left buttock and  increases with standing for a long time and bending. Will treat for pain and muscle spasm. She will follow up with ortho. Stable for discharge without neuro deficits.  Discussed with the patient and all  questioned fully answered. She will return if any problems arise.     Medication List    STOP taking these medications       ALEVE 220 MG tablet  Generic drug:  naproxen sodium     traMADol 50 MG tablet  Commonly known as:  ULTRAM      TAKE these medications       cyclobenzaprine 5 MG tablet  Commonly known as:  FLEXERIL  Take 1 tablet (5 mg total) by mouth 3 (three) times daily as needed for muscle spasms.     HYDROcodone-acetaminophen 5-325 MG per tablet  Commonly known as:  NORCO/VICODIN  Take 1 tablet by mouth every 4 (four) hours as needed.      ASK your doctor about these medications       diclofenac 75 MG EC tablet  Commonly known as:  VOLTAREN  Take 1 tablet (75 mg total) by mouth 2 (two) times daily.         [address] [place], [place] 08/20/14 [phone]

## 2014-08-23 ENCOUNTER — Emergency Department (HOSPITAL_COMMUNITY)
Admission: EM | Admit: 2014-08-23 | Discharge: 2014-08-23 | Disposition: A | Discharge status: Home / Self Care | Payer: Self-pay | Attending: Emergency Medicine | Admitting: Emergency Medicine

## 2014-08-23 ENCOUNTER — Encounter (HOSPITAL_COMMUNITY): Payer: Self-pay | Admitting: Emergency Medicine

## 2014-08-23 DIAGNOSIS — M5432 Sciatica, left side: Secondary | ICD-10-CM

## 2014-08-23 DIAGNOSIS — Z880 Allergy status to penicillin: Secondary | ICD-10-CM | POA: Insufficient documentation

## 2014-08-23 DIAGNOSIS — Z87442 Personal history of urinary calculi: Secondary | ICD-10-CM | POA: Insufficient documentation

## 2014-08-23 DIAGNOSIS — IMO0002 Reserved for concepts with insufficient information to code with codable children: Secondary | ICD-10-CM | POA: Insufficient documentation

## 2014-08-23 DIAGNOSIS — Z87440 Personal history of urinary (tract) infections: Secondary | ICD-10-CM | POA: Insufficient documentation

## 2014-08-23 DIAGNOSIS — M545 Low back pain, unspecified: Secondary | ICD-10-CM | POA: Insufficient documentation

## 2014-08-23 DIAGNOSIS — Z8709 Personal history of other diseases of the respiratory system: Secondary | ICD-10-CM | POA: Insufficient documentation

## 2014-08-23 DIAGNOSIS — M5430 Sciatica, unspecified side: Secondary | ICD-10-CM | POA: Insufficient documentation

## 2014-08-23 MED ORDER — PREDNISONE 10 MG PO TABS: ORAL_TABLET | ORAL | Status: DC

## 2014-08-23 NOTE — ED Notes (Signed)
Pt c/o back pain to left lumbar area. Was recently seen and treated for same. Unable to make f/u appt due to no insurance. Pt reports pain medication and flexeril given at last visit give her a HA. Ambulates with out difficulty.

## 2014-08-23 NOTE — Discharge Instructions (Signed)
Back Pain, Adult °Back pain is very common. The pain often gets better over time. The cause of back pain is usually not dangerous. Most people can learn to manage their back pain on their own.  °HOME CARE  °· Stay active. Start with short walks on flat ground if you can. Try to walk farther each day. °· Do not sit, drive, or stand in one place for more than 30 minutes. Do not stay in bed. °· Do not avoid exercise or work. Activity can help your back heal faster. °· Be careful when you bend or lift an object. Bend at your knees, keep the object close to you, and do not twist. °· Sleep on a firm mattress. Lie on your side, and bend your knees. If you lie on your back, put a pillow under your knees. °· Only take medicines as told by your doctor. °· Put ice on the injured area. °¨ Put ice in a plastic bag. °¨ Place a towel between your skin and the bag. °¨ Leave the ice on for 15-20 minutes, 03-04 times a day for the first 2 to 3 days. After that, you can switch between ice and heat packs. °· Ask your doctor about back exercises or massage. °· Avoid feeling anxious or stressed. Find good ways to deal with stress, such as exercise. °GET HELP RIGHT AWAY IF:  °· Your pain does not go away with rest or medicine. °· Your pain does not go away in 1 week. °· You have new problems. °· You do not feel well. °· The pain spreads into your legs. °· You cannot control when you poop (bowel movement) or pee (urinate). °· Your arms or legs feel weak or lose feeling (numbness). °· You feel sick to your stomach (nauseous) or throw up (vomit). °· You have belly (abdominal) pain. °· You feel like you may pass out (faint). °MAKE SURE YOU:  °· Understand these instructions. °· Will watch your condition. °· Will get help right away if you are not doing well or get worse. °Document Released: 06/02/2008 Document Revised: 03/08/2012 Document Reviewed: 04/18/2014 °Exit[employer]® Patient Information ©2015 [employer]. This information is not intended  to replace advice given to you by your health care provider. Make sure you discuss any questions you have with your health care provider. ° °Sciatica °Sciatica is pain, weakness, numbness, or tingling along your sciatic nerve. The nerve starts in the lower back and runs down the back of each leg. Nerve damage or certain conditions pinch or put pressure on the sciatic nerve. This causes the pain, weakness, and other discomforts of sciatica. °HOME CARE  °· Only take medicine as told by your doctor. °· Apply ice to the affected area for 20 minutes. Do this 3-4 times a day for the first 48-72 hours. Then try heat in the same way. °· Exercise, stretch, or do your usual activities if these do not make your pain worse. °· Go to physical therapy as told by your doctor. °· Keep all doctor visits as told. °· Do not wear high heels or shoes that are not supportive. °· Get a firm mattress if your mattress is too soft to lessen pain and discomfort. °GET HELP RIGHT AWAY IF:  °· You cannot control when you poop (bowel movement) or pee (urinate). °· You have more weakness in your lower back, lower belly (pelvis), butt (buttocks), or legs. °· You have redness or puffiness (swelling) of your back. °· You have a burning feeling   when you pee. °· You have pain that gets worse when you lie down. °· You have pain that wakes you from your sleep. °· Your pain is worse than past pain. °· Your pain lasts longer than 4 weeks. °· You are suddenly losing weight without reason. °MAKE SURE YOU:  °· Understand these instructions. °· Will watch this condition. °· Will get help right away if you are not doing well or get worse. °Document Released: 09/23/2008 Document Revised: 06/15/2012 Document Reviewed: 04/25/2012 °ExitCare® Patient Information ©2015 [employer]. This information is not intended to replace advice given to you by your health care provider. Make sure you discuss any questions you have with your health care provider. ° °

## 2014-08-25 NOTE — ED Provider Notes (Signed)
CSN: [id number]     Arrival date & time 08/23/14  1652 History   First MD Initiated Contact with Patient 08/23/14 1838     Chief Complaint  Patient presents with  . Back Pain   Jacqueline [name] is a 38 y.o. female who presents to the Emergency Department complaining of left lower back pain for over one week.  She was recently seen here for same and does not have funds to f/u with a PMD  And states the medications given recently are not helping the pain .     (Consider location/radiation/quality/duration/timing/severity/associated sxs/prior Treatment) Patient is a 38 y.o. female presenting with back pain.  Back Pain Location:  Lumbar spine Quality:  Cramping and aching Radiates to:  L posterior upper leg and L thigh Pain severity:  Moderate Pain is:  Same all the time Onset quality:  Gradual Duration:  1 week Timing:  Constant Progression:  Unchanged Chronicity:  New Context: not falling, not recent illness and not recent injury   Relieved by:  Nothing Worsened by:  Bending, twisting and movement Ineffective treatments:  Muscle relaxants, narcotics and NSAIDs Associated symptoms: leg pain   Associated symptoms: no abdominal pain, no abdominal swelling, no bladder incontinence, no bowel incontinence, no chest pain, no dysuria, no fever, no headaches, no numbness, no paresthesias, no pelvic pain, no perianal numbness, no tingling and no weakness     Past Medical History  Diagnosis Date  . URI (upper respiratory infection)   . Sinusitis   . Kidney stones   . UTI (lower urinary tract infection)    Past Surgical History  Procedure Laterality Date  . Tubal ligation     Family History  Problem Relation Age of Onset  . Diabetes Other    History  Substance Use Topics  . Smoking status: Former Smoker -- 0.50 packs/day for 4 years    Types: Cigarettes  . Smokeless tobacco: Never Used  . Alcohol Use: No   OB History   Grav Para Term Preterm Abortions TAB SAB Ect Mult Living   Review of Systems  Constitutional: Negative for fever.  Respiratory: Negative for shortness of breath.   Cardiovascular: Negative for chest pain.  Gastrointestinal: Negative for vomiting, abdominal pain, constipation and bowel incontinence.  Genitourinary: Negative for bladder incontinence, dysuria, hematuria, flank pain, decreased urine volume, difficulty urinating and pelvic pain.       No perineal numbness or incontinence of urine or feces  Musculoskeletal: Positive for back pain. Negative for joint swelling.  Skin: Negative for rash.  Neurological: Negative for tingling, weakness, numbness, headaches and paresthesias.  All other systems reviewed and are negative.     Allergies  Penicillins  Home Medications   Prior to Admission medications   Medication Sig Start Date End Date Taking? Authorizing Provider  cyclobenzaprine (FLEXERIL) 5 MG tablet Take 1 tablet (5 mg total) by mouth 3 (three) times daily as needed for muscle spasms. 08/20/14   Jacqueline Orlene Och, NP  HYDROcodone-acetaminophen (NORCO/VICODIN) 5-325 MG per tablet Take 1 tablet by mouth every 4 (four) hours as needed. 08/20/14   Jacqueline Orlene Och, NP  predniSONE (DELTASONE) 10 MG tablet Take 6 tablets day one, 5 tablets day two, 4 tablets day three, 3 tablets day four, 2 tablets day five, then 1 tablet day six 08/23/14   Jacqueline [name] L. Zenas Santa, PA-C   BP 115/79  Pulse 84  Temp(Src) 98.2  F (36.8 C) (Oral)  Ht 5' (1.524 m)  Wt 145 lb (65.772 kg)  BMI 28.32 kg/m2  SpO2 100%  LMP 08/07/2014 Physical Exam  Nursing note and vitals reviewed. Constitutional: She is oriented to person, place, and time. She appears well-developed and well-nourished. No distress.  HENT:  Head: Normocephalic and atraumatic.  Neck: Normal range of motion. Neck supple.  Cardiovascular: Normal rate, regular rhythm, normal heart sounds and intact distal pulses.   No murmur heard. Pulmonary/Chest: Effort normal and breath sounds normal. No  respiratory distress.  Abdominal: Soft. She exhibits no distension. There is no tenderness.  Musculoskeletal: She exhibits tenderness. She exhibits no edema.       Lumbar back: She exhibits tenderness and pain. She exhibits normal range of motion, no swelling, no deformity, no laceration and normal pulse.  ttp of the left lumbar paraspinal muscles and SI joint.  No spinal tenderness.  DP pulses are brisk and symmetrical.  Distal sensation intact.  Hip Flexors/Extensors are intact.  Pt has 5/5 strength against resistance of bilateral lower extremities.     Neurological: She is alert and oriented to person, place, and time. She has normal strength. No sensory deficit. She exhibits normal muscle tone. Coordination and gait normal.  Reflex Scores:      Patellar reflexes are 2+ on the right side and 2+ on the left side.      Achilles reflexes are 2+ on the right side and 2+ on the left side. Skin: Skin is warm and dry. No rash noted.    ED Course  Procedures (including critical care time) Labs Review Labs Reviewed - No data to display  Imaging Review No results found.   EKG Interpretation None      MDM   Final diagnoses:  Sciatica, left    Previous ED chart reviewed by me  Pt is well appearing, non-toxic.  No concerning sx's for emergent neurological or infectious process.  Ambulates with a steady gait.  She was given rx for vicodin on previous visit 3 days ago.  Will prescribe prednisone taper as sx's appear c/w sciatica.  She was given [facility].  She agrees to plan and appears stable for d/c.      Trevione Wert L. Vertis Bauder, PA-C 08/25/14 1351

## 2014-08-31 NOTE — ED Provider Notes (Signed)
Medical screening examination/treatment/procedure(s) were performed by non-physician practitioner and as supervising physician I was immediately available for consultation/collaboration.   EKG Interpretation None        Rolland Porter, MD 08/31/14 2129

## 2014-10-30 ENCOUNTER — Encounter (HOSPITAL_COMMUNITY): Payer: Self-pay | Admitting: Emergency Medicine

## 2015-08-03 ENCOUNTER — Emergency Department (HOSPITAL_COMMUNITY)
Admission: EM | Admit: 2015-08-03 | Discharge: 2015-08-03 | Disposition: A | Discharge status: Home / Self Care | Payer: Self-pay | Attending: Emergency Medicine | Admitting: Emergency Medicine

## 2015-08-03 ENCOUNTER — Encounter (HOSPITAL_COMMUNITY): Payer: Self-pay | Admitting: Emergency Medicine

## 2015-08-03 ENCOUNTER — Emergency Department (HOSPITAL_COMMUNITY): Payer: Self-pay

## 2015-08-03 DIAGNOSIS — Z87442 Personal history of urinary calculi: Secondary | ICD-10-CM | POA: Insufficient documentation

## 2015-08-03 DIAGNOSIS — Z880 Allergy status to penicillin: Secondary | ICD-10-CM | POA: Insufficient documentation

## 2015-08-03 DIAGNOSIS — R059 Cough, unspecified: Secondary | ICD-10-CM

## 2015-08-03 DIAGNOSIS — R062 Wheezing: Secondary | ICD-10-CM | POA: Insufficient documentation

## 2015-08-03 DIAGNOSIS — Z87440 Personal history of urinary (tract) infections: Secondary | ICD-10-CM | POA: Insufficient documentation

## 2015-08-03 DIAGNOSIS — R0981 Nasal congestion: Secondary | ICD-10-CM | POA: Insufficient documentation

## 2015-08-03 DIAGNOSIS — Z87891 Personal history of nicotine dependence: Secondary | ICD-10-CM | POA: Insufficient documentation

## 2015-08-03 DIAGNOSIS — Z791 Long term (current) use of non-steroidal anti-inflammatories (NSAID): Secondary | ICD-10-CM | POA: Insufficient documentation

## 2015-08-03 DIAGNOSIS — Z79899 Other long term (current) drug therapy: Secondary | ICD-10-CM | POA: Insufficient documentation

## 2015-08-03 DIAGNOSIS — Z8709 Personal history of other diseases of the respiratory system: Secondary | ICD-10-CM | POA: Insufficient documentation

## 2015-08-03 DIAGNOSIS — R05 Cough: Secondary | ICD-10-CM | POA: Insufficient documentation

## 2015-08-03 MED ORDER — ALBUTEROL SULFATE HFA 108 (90 BASE) MCG/ACT IN AERS
2.0000 | INHALATION_SPRAY | Freq: Once | RESPIRATORY_TRACT | Status: AC
  Administered 2015-08-03: 2 via RESPIRATORY_TRACT
  Filled 2015-08-03: qty 6.7

## 2015-08-03 MED ORDER — AZITHROMYCIN 250 MG PO TABS: ORAL_TABLET | ORAL | Status: DC

## 2015-08-03 MED ORDER — AZITHROMYCIN 250 MG PO TABS
500.0000 mg | ORAL_TABLET | Freq: Once | ORAL | Status: AC
  Administered 2015-08-03: 500 mg via ORAL
  Filled 2015-08-03: qty 2

## 2015-08-03 NOTE — ED Notes (Signed)
Pt states that she has had nonproductive cough since last Friday.

## 2015-08-03 NOTE — ED Notes (Signed)
Respiratory called

## 2015-08-04 NOTE — ED Provider Notes (Signed)
CSN: [id number]     Arrival date & time 08/03/15  1229 History   First MD Initiated Contact with Patient 08/03/15 1247     Chief Complaint  Patient presents with  . Cough     (Consider location/radiation/quality/duration/timing/severity/associated sxs/prior Treatment) HPI  Jacqueline [name] is a 39 y.o. female who presents to the Emergency Department complaining of cough for one week.  She describes a cough that is mostly non-productive and worse with exertion and lying down.  She also reports nasal congestion.  She has tried OTC cough medicine without relief.  She denies fever, chills, shortness of breath, chest pain, sore throat and hx of BCP's or recent travel.     Past Medical History  Diagnosis Date  . URI (upper respiratory infection)   . Sinusitis   . Kidney stones   . UTI (lower urinary tract infection)    Past Surgical History  Procedure Laterality Date  . Tubal ligation     Family History  Problem Relation Age of Onset  . Diabetes Other    History  Substance Use Topics  . Smoking status: Former Smoker -- 0.50 packs/day for 4 years    Types: Cigarettes  . Smokeless tobacco: Never Used  . Alcohol Use: No   OB History    Gravida Para Term Preterm AB TAB SAB Ectopic Multiple Living   Review of Systems  Constitutional: Negative for fever, chills and appetite change.  HENT: Positive for congestion. Negative for ear pain, sore throat and trouble swallowing.   Respiratory: Positive for cough. Negative for chest tightness, shortness of breath and wheezing.   Cardiovascular: Negative for chest pain.  Gastrointestinal: Negative for nausea, vomiting and abdominal pain.  Genitourinary: Negative for dysuria.  Musculoskeletal: Negative for arthralgias and neck pain.  Skin: Negative for rash.  Neurological: Negative for dizziness, weakness and numbness.  Hematological: Negative for adenopathy.  All other systems reviewed and are negative.     Allergies   Penicillins  Home Medications   Prior to Admission medications   Medication Sig Start Date End Date Taking? Authorizing Provider  diclofenac (VOLTAREN) 75 MG EC tablet Take 75 mg by mouth 2 (two) times daily.   Yes Historical Provider, MD  ranitidine (ZANTAC) 150 MG tablet Take 150 mg by mouth 2 (two) times daily.   Yes Historical Provider, MD  azithromycin (ZITHROMAX) 250 MG tablet Take two tablets on day one, then one tab qd days 2-5 08/03/15   Geneve Kimpel, PA-C  cyclobenzaprine (FLEXERIL) 5 MG tablet Take 1 tablet (5 mg total) by mouth 3 (three) times daily as needed for muscle spasms. Patient not taking: Reported on 08/03/2015 08/20/14   Janne Napoleon, NP  HYDROcodone-acetaminophen (NORCO/VICODIN) 5-325 MG per tablet Take 1 tablet by mouth every 4 (four) hours as needed. Patient not taking: Reported on 08/03/2015 08/20/14   Janne Napoleon, NP  predniSONE (DELTASONE) 10 MG tablet Take 6 tablets day one, 5 tablets day two, 4 tablets day three, 3 tablets day four, 2 tablets day five, then 1 tablet day six Patient not taking: Reported on 08/03/2015 08/23/14   Jamesen Stahnke, PA-C   BP 124/78 mmHg  Pulse 86  Temp(Src) 98.7 F (37.1 C) (Oral)  Resp 16  Ht 5' (1.524 m)  Wt 162 lb (73.483 kg)  BMI 31.64 kg/m2  SpO2 100%  LMP 07/20/2015 Physical Exam  Constitutional: She is oriented to person, place,  and time. She appears well-developed and well-nourished. No distress.  HENT:  Head: Normocephalic and atraumatic.  Right Ear: Tympanic membrane and ear canal normal.  Left Ear: Tympanic membrane and ear canal normal.  Mouth/Throat: Uvula is midline, oropharynx is clear and moist and mucous membranes are normal. No oropharyngeal exudate.  Eyes: EOM are normal. Pupils are equal, round, and reactive to light.  Neck: Normal range of motion, full passive range of motion without pain and phonation normal. Neck supple.  Cardiovascular: Normal rate, regular rhythm, normal heart sounds and intact distal  pulses.   No murmur heard. Pulmonary/Chest: Effort normal. No stridor. No respiratory distress. She has wheezes. She has no rales. She exhibits no tenderness.  Coarse lungs sounds bilaterally.  Few expiratory wheezes.  No rales  Musculoskeletal: Normal range of motion. She exhibits no edema.  Lymphadenopathy:    She has no cervical adenopathy.  Neurological: She is alert and oriented to person, place, and time. She exhibits normal muscle tone. Coordination normal.  Skin: Skin is warm and dry.  Psychiatric: She has a normal mood and affect.  Nursing note and vitals reviewed.   ED Course  Procedures (including critical care time) Labs Review Labs Reviewed - No data to display  Imaging Review Dg Chest 2 View  08/03/2015   CLINICAL DATA:  Cough, fever, nasal congestion  EXAM: CHEST  2 VIEW  COMPARISON:  None.  FINDINGS: Cardiomediastinal silhouette is unremarkable. Mild thoracic dextroscoliosis. There is right base medially atelectasis or early infiltrate. No pulmonary edema.  IMPRESSION: Mild thoracic dextroscoliosis. Right base medially atelectasis or early infiltrate.   Electronically Signed   By: Natasha Mead M.D.   On: 08/03/2015 13:28     EKG Interpretation None      MDM   Final diagnoses:  Cough   Pt is well appearing, vitals stable.  Non-toxic appearing.  PERC negative.  Possible early infiltrate according to XR.  Pt is stable d/c and agrees to close PMD f/u  Pt agrees to plan.   Pauline Aus, PA-C 08/04/15 2028  Doug Sou, MD 08/05/15 [phone]

## 2015-08-06 ENCOUNTER — Encounter (HOSPITAL_COMMUNITY): Payer: Self-pay | Admitting: Emergency Medicine

## 2015-08-06 ENCOUNTER — Emergency Department (HOSPITAL_COMMUNITY)
Admission: EM | Admit: 2015-08-06 | Discharge: 2015-08-06 | Disposition: A | Discharge status: Home / Self Care | Payer: Self-pay | Attending: Physician Assistant | Admitting: Physician Assistant

## 2015-08-06 ENCOUNTER — Emergency Department (HOSPITAL_COMMUNITY): Payer: Self-pay

## 2015-08-06 DIAGNOSIS — Z87891 Personal history of nicotine dependence: Secondary | ICD-10-CM | POA: Insufficient documentation

## 2015-08-06 DIAGNOSIS — J189 Pneumonia, unspecified organism: Secondary | ICD-10-CM

## 2015-08-06 DIAGNOSIS — Z79899 Other long term (current) drug therapy: Secondary | ICD-10-CM | POA: Insufficient documentation

## 2015-08-06 DIAGNOSIS — Z880 Allergy status to penicillin: Secondary | ICD-10-CM | POA: Insufficient documentation

## 2015-08-06 DIAGNOSIS — J159 Unspecified bacterial pneumonia: Secondary | ICD-10-CM | POA: Insufficient documentation

## 2015-08-06 DIAGNOSIS — Z791 Long term (current) use of non-steroidal anti-inflammatories (NSAID): Secondary | ICD-10-CM | POA: Insufficient documentation

## 2015-08-06 DIAGNOSIS — Z87442 Personal history of urinary calculi: Secondary | ICD-10-CM | POA: Insufficient documentation

## 2015-08-06 DIAGNOSIS — Z87440 Personal history of urinary (tract) infections: Secondary | ICD-10-CM | POA: Insufficient documentation

## 2015-08-06 MED ORDER — BENZONATATE 100 MG PO CAPS: 100.0000 mg | ORAL_CAPSULE | Freq: Three times a day (TID) | ORAL | Status: DC | PRN

## 2015-08-06 MED ORDER — GUAIFENESIN-CODEINE 100-10 MG/5ML PO SOLN: 5.0000 mL | Freq: Four times a day (QID) | ORAL | Status: DC | PRN

## 2015-08-06 MED ORDER — LEVOFLOXACIN 500 MG PO TABS: 500.0000 mg | ORAL_TABLET | Freq: Every day | ORAL | Status: AC

## 2015-08-06 MED ORDER — LEVOFLOXACIN 500 MG PO TABS
500.0000 mg | ORAL_TABLET | Freq: Every day | ORAL | Status: DC
  Administered 2015-08-06: 500 mg via ORAL
  Filled 2015-08-06: qty 1

## 2015-08-06 MED ORDER — IBUPROFEN 800 MG PO TABS
800.0000 mg | ORAL_TABLET | Freq: Once | ORAL | Status: AC
  Administered 2015-08-06: 800 mg via ORAL
  Filled 2015-08-06: qty 1

## 2015-08-06 MED ORDER — ONDANSETRON HCL 4 MG PO TABS: 4.0000 mg | ORAL_TABLET | Freq: Three times a day (TID) | ORAL | Status: DC | PRN

## 2015-08-06 NOTE — ED Notes (Addendum)
Pt seen for same on Friday. Pt reports cough, chest pain with movement and deep breath remains. Pt reports inhaler that was prescribed is not helping cough. Pt reports since starting abx has had intermittent v/d. Pt reports last episode of diarrhea was this am.

## 2015-08-06 NOTE — ED Provider Notes (Signed)
CSN: [id number]     Arrival date & time 08/06/15  0654 History   First MD Initiated Contact with Patient 08/06/15 [phone]     Chief Complaint  Patient presents with  . Cough     (Consider location/radiation/quality/duration/timing/severity/associated sxs/prior Treatment) HPI   Patient is a very pleasant female presenting today with cough after viral infection. Patient was seen couple of days ago here at [facility]. She had a chest x-ray which showed no pulmonary infiltrate but was given azithromycin and inhaler to go home. Patient to that she still been having a cough. And that she still has chest pain when coughing. She reports that it is worse while she is at work. Not related to food. Patient's not taking lisinopril. PERC negative. No fevers. Dry cough has continued and she is concerned about it,  Past Medical History  Diagnosis Date  . URI (upper respiratory infection)   . Sinusitis   . Kidney stones   . UTI (lower urinary tract infection)    Past Surgical History  Procedure Laterality Date  . Tubal ligation     Family History  Problem Relation Age of Onset  . Diabetes Other    History  Substance Use Topics  . Smoking status: Former Smoker -- 0.50 packs/day for 4 years    Types: Cigarettes  . Smokeless tobacco: Never Used  . Alcohol Use: No   OB History    Gravida Para Term Preterm AB TAB SAB Ectopic Multiple Living   Review of Systems  Constitutional: Negative for activity change and fatigue.  HENT: Negative for congestion and drooling.   Eyes: Negative for discharge.  Respiratory: Positive for cough. Negative for chest tightness.   Cardiovascular: Positive for chest pain.  Gastrointestinal: Positive for diarrhea. Negative for abdominal distention.  Genitourinary: Negative for dysuria and difficulty urinating.  Musculoskeletal: Negative for joint swelling.  Skin: Negative for rash.  Allergic/Immunologic: Negative for immunocompromised state.   Neurological: Negative for seizures and speech difficulty.  Psychiatric/Behavioral: Negative for behavioral problems and agitation.      Allergies  Penicillins  Home Medications   Prior to Admission medications   Medication Sig Start Date End Date Taking? Authorizing Provider  azithromycin (ZITHROMAX) 250 MG tablet Take two tablets on day one, then one tab qd days 2-5 08/03/15   Tammy Triplett, PA-C  cyclobenzaprine (FLEXERIL) 5 MG tablet Take 1 tablet (5 mg total) by mouth 3 (three) times daily as needed for muscle spasms. Patient not taking: Reported on 08/03/2015 08/20/14   Janne Napoleon, NP  diclofenac (VOLTAREN) 75 MG EC tablet Take 75 mg by mouth 2 (two) times daily.    Historical Provider, MD  HYDROcodone-acetaminophen (NORCO/VICODIN) 5-325 MG per tablet Take 1 tablet by mouth every 4 (four) hours as needed. Patient not taking: Reported on 08/03/2015 08/20/14   Janne Napoleon, NP  predniSONE (DELTASONE) 10 MG tablet Take 6 tablets day one, 5 tablets day two, 4 tablets day three, 3 tablets day four, 2 tablets day five, then 1 tablet day six Patient not taking: Reported on 08/03/2015 08/23/14   Tammy Triplett, PA-C  ranitidine (ZANTAC) 150 MG tablet Take 150 mg by mouth 2 (two) times daily.    Historical Provider, MD   BP 130/91 mmHg  Pulse 83  Temp(Src) 97.9 F (36.6 C) (Oral)  Resp 18  Ht 5' (1.524 m)  Wt 162 lb (73.483 kg)  BMI 31.64  kg/m2  SpO2 99%  LMP 07/20/2015 Physical Exam  Constitutional: She is oriented to person, place, and time. She appears well-developed and well-nourished.  HENT:  Head: Normocephalic and atraumatic.  Eyes: Conjunctivae are normal. Right eye exhibits no discharge.  Neck: Neck supple.  Cardiovascular: Normal rate, regular rhythm and normal heart sounds.   No murmur heard. Pulmonary/Chest: Effort normal and breath sounds normal. She has no wheezes. She has no rales.  Abdominal: Soft. She exhibits no distension. There is no tenderness.  Musculoskeletal:  Normal range of motion. She exhibits no edema.  Neurological: She is oriented to person, place, and time. No cranial nerve deficit.  Skin: Skin is warm and dry. No rash noted. She is not diaphoretic.  Psychiatric: She has a normal mood and affect. Her behavior is normal.  Nursing note and vitals reviewed.   ED Course  Procedures (including critical care time) Labs Review Labs Reviewed - No data to display  Imaging Review No results found.   EKG Interpretation None      MDM   Final diagnoses:  None   patient is a pleasant 39 year old female returning to the emergency department after being diagnosed with bronchitis couple days ago. She is almost finished her antibiotics, they've been giving her diarrhea but she's been able to stay hydrated. She reports that she still has a residual cough. I think this represents a viral cough that may stick around for 3-6 weeks after the infection. We encouraged symptomatic care. We will give cough syrup with codeine to help her cough at night. We encouraged her to take tyelnol with food as needed.    Patient has residual pneumonia seen on chest x-ray. She has not taken her full course of antibiotics yet. Patient admitted the second time I saw her that she's been unable to take her antibiotic because the diarrhea. Patient has no insurance. An acceptable alternative to Zithromax would belevofloxacin. However this is cost prohibitive. We did find a coupon online and presented for her to bring to [facility] so that the cost is only $11. We will have her use this coupon, if it does not work she has instructions to continue on her azithro.  Courteney Randall An, MD 08/06/15 [phone]

## 2015-08-06 NOTE — Discharge Instructions (Signed)
You were seen today for pneumonia.  We gave you a new perscription for LEVOFLOXACIN which is a different antibiotic that doesn't cause diarrhea.  With the coupon you should be able to get it for 11 dollars.  If it doesn't work then continue on your azithromycin that you already have.   Return if not feeling better in 4 days.

## 2016-04-30 ENCOUNTER — Emergency Department (HOSPITAL_COMMUNITY)
Admission: EM | Admit: 2016-04-30 | Discharge: 2016-04-30 | Disposition: A | Discharge status: Home / Self Care | Payer: Self-pay | Attending: Emergency Medicine | Admitting: Emergency Medicine

## 2016-04-30 ENCOUNTER — Encounter (HOSPITAL_COMMUNITY): Payer: Self-pay | Admitting: Emergency Medicine

## 2016-04-30 DIAGNOSIS — Z87891 Personal history of nicotine dependence: Secondary | ICD-10-CM | POA: Insufficient documentation

## 2016-04-30 DIAGNOSIS — M722 Plantar fascial fibromatosis: Secondary | ICD-10-CM

## 2016-04-30 DIAGNOSIS — H9202 Otalgia, left ear: Secondary | ICD-10-CM

## 2016-04-30 MED ORDER — NAPROXEN 500 MG PO TABS: 500.0000 mg | ORAL_TABLET | Freq: Two times a day (BID) | ORAL | Status: DC

## 2016-04-30 MED ORDER — AZITHROMYCIN 250 MG PO TABS: 250.0000 mg | ORAL_TABLET | Freq: Every day | ORAL | Status: DC

## 2016-04-30 NOTE — Discharge Instructions (Signed)
Plantar Fasciitis Plantar fasciitis is a painful foot condition that affects the heel. It occurs when the band of tissue that connects the toes to the heel bone (plantar fascia) becomes irritated. This can happen after exercising too much or doing other repetitive activities (overuse injury). The pain from plantar fasciitis can range from mild irritation to severe pain that makes it difficult for you to walk or move. The pain is usually worse in the morning or after you have been sitting or lying down for a while. CAUSES This condition may be caused by:  Standing for long periods of time.  Wearing shoes that do not fit.  Doing high-impact activities, including running, aerobics, and ballet.  Being overweight.  Having an abnormal way of walking (gait).  Having tight calf muscles.  Having high arches in your feet.  Starting a new athletic activity. SYMPTOMS The main symptom of this condition is heel pain. Other symptoms include:  Pain that gets worse after activity or exercise.  Pain that is worse in the morning or after resting.  Pain that goes away after you walk for a few minutes. DIAGNOSIS This condition may be diagnosed based on your signs and symptoms. Your health care provider will also do a physical exam to check for:  A tender area on the bottom of your foot.  A high arch in your foot.  Pain when you move your foot.  Difficulty moving your foot. You may also need to have imaging studies to confirm the diagnosis. These can include:  X-rays.  Ultrasound.  MRI. TREATMENT  Treatment for plantar fasciitis depends on the severity of the condition. Your treatment may include:  Rest, ice, and over-the-counter pain medicines to manage your pain.  Exercises to stretch your calves and your plantar fascia.  A splint that holds your foot in a stretched, upward position while you sleep (night splint).  Physical therapy to relieve symptoms and prevent problems in the  future.  Cortisone injections to relieve severe pain.  Extracorporeal shock wave therapy (ESWT) to stimulate damaged plantar fascia with electrical impulses. It is often used as a last resort before surgery.  Surgery, if other treatments have not worked after 12 months. HOME CARE INSTRUCTIONS  Take medicines only as directed by your health care provider.  Avoid activities that cause pain.  Roll the bottom of your foot over a bag of ice or a bottle of cold water. Do this for 20 minutes, 3-4 times a day.  Perform simple stretches as directed by your health care provider.  Try wearing athletic shoes with air-sole or gel-sole cushions or soft shoe inserts.  Wear a night splint while sleeping, if directed by your health care provider.  Keep all follow-up appointments with your health care provider. PREVENTION   Do not perform exercises or activities that cause heel pain.  Consider finding low-impact activities if you continue to have problems.  Lose weight if you need to. The best way to prevent plantar fasciitis is to avoid the activities that aggravate your plantar fascia. SEEK MEDICAL CARE IF:  Your symptoms do not go away after treatment with home care measures.  Your pain gets worse.  Your pain affects your ability to move or do your daily activities.   This information is not intended to replace advice given to you by your health care provider. Make sure you discuss any questions you have with your health care provider.   Document Released: 09/09/2001 Document Revised: 09/05/2015 Document Reviewed: 10/25/2014 Elsevier  Interactive Patient Education 2016 [employer]  Earache An earache, also called otalgia, can be caused by many things. Pain from an earache can be sharp, dull, or burning. The pain may be temporary or constant. Earaches can be caused by problems with the ear, such as infection in either the middle ear or the ear canal, injury, impacted ear wax, middle  ear pressure, or a foreign body in the ear. Ear pain can also result from problems in other areas. This is called referred pain. For example, pain can come from a sore throat, a tooth infection, or problems with the jaw or the joint between the jaw and the skull (temporomandibular joint, or TMJ). The cause of an earache is not always easy to identify. Watchful waiting may be appropriate for some earaches until a clear cause of the pain can be found. HOME CARE INSTRUCTIONS Watch your condition for any changes. The following actions may help to lessen any discomfort that you are feeling:  Take medicines only as directed by your health care provider. This includes ear drops.  Apply ice to your outer ear to help reduce pain.  Put ice in a plastic bag.  Place a towel between your skin and the bag.  Leave the ice on for 20 minutes, 2-3 times per day.  Do not put anything in your ear other than medicine that is prescribed by your health care provider.  Try resting in an upright position instead of lying down. This may help to reduce pressure in the middle ear and relieve pain.  Chew gum if it helps to relieve your ear pain.  Control any allergies that you have.  Keep all follow-up visits as directed by your health care provider. This is important. SEEK MEDICAL CARE IF:  Your pain does not improve within 2 days.  You have a fever.  You have new or worsening symptoms. SEEK IMMEDIATE MEDICAL CARE IF:  You have a severe headache.  You have a stiff neck.  You have difficulty swallowing.  You have redness or swelling behind your ear.  You have drainage from your ear.  You have hearing loss.  You feel dizzy.   This information is not intended to replace advice given to you by your health care provider. Make sure you discuss any questions you have with your health care provider.   Document Released: 08/01/2004 Document Revised: 01/05/2015 Document Reviewed: 07/16/2014 Elsevier  Interactive Patient Education [employer]2016 Else[employer] Inc.

## 2016-04-30 NOTE — ED Notes (Signed)
Pt reports left ear pain and left teeth pain x2 weeks with sinus pressure, nasal drainage, scratchy throat, dry cough and fevers intermittently.  Pt also c/o right medial foot pain that started after working a shift at her job.  Pt has full ROM and pulses/cap refill appropriate.

## 2016-04-30 NOTE — ED Provider Notes (Signed)
CSN: [id number]     Arrival date & time 04/30/16  [id number] History   First MD Initiated Contact with Patient 04/30/16 [phone]     Chief Complaint  Patient presents with  . Otalgia     (Consider location/radiation/quality/duration/timing/severity/associated sxs/prior Treatment) HPI   Jacqueline [name] is a 40 y.o. female who presents to the Emergency Department complaining of left ear pain and left facial pain for 2 weeks.  She notes having pressure to her face and some nasal congestion.  Sx's improved with OTC cold medications.  She denies chest pain, shortness of breath.  She also notes pain to her right foot for several days.  States that she started a new job and walks a lot.  Pain to her foot is worse with weight bearing and improves at rest.  She denies redness, calf pain or open wounds to her foot.    Past Medical History  Diagnosis Date  . URI (upper respiratory infection)   . Sinusitis   . Kidney stones   . UTI (lower urinary tract infection)    Past Surgical History  Procedure Laterality Date  . Tubal ligation     Family History  Problem Relation Age of Onset  . Diabetes Other    Social History  Substance Use Topics  . Smoking status: Former Smoker -- 0.50 packs/day for 4 years    Types: Cigarettes  . Smokeless tobacco: Never Used  . Alcohol Use: No   OB History    Gravida Para Term Preterm AB TAB SAB Ectopic Multiple Living   3 3 3       3      Review of Systems  Constitutional: Negative for fever and chills.  HENT: Positive for congestion, ear pain and sinus pressure. Negative for ear discharge, sore throat and trouble swallowing.   Musculoskeletal: Positive for arthralgias (right foot pain).  Skin: Negative for rash.  Neurological: Negative for weakness and numbness.      Allergies  Penicillins  Home Medications   Prior to Admission medications   Medication Sig Start Date End Date Taking? Authorizing Provider  azithromycin (ZITHROMAX) 250 MG tablet Take two  tablets on day one, then one tab qd days 2-5 08/03/15   Shaniyah Wix, PA-C  benzonatate (TESSALON PERLES) 100 MG capsule Take 1 capsule (100 mg total) by mouth 3 (three) times daily as needed for cough. 08/06/15   Courteney Lyn Mackuen, MD  cyclobenzaprine (FLEXERIL) 5 MG tablet Take 1 tablet (5 mg total) by mouth 3 (three) times daily as needed for muscle spasms. Patient not taking: Reported on 08/03/2015 08/20/14   Janne NapoleonHope M Neese, NP  diclofenac (VOLTAREN) 75 MG EC tablet Take 75 mg by mouth 2 (two) times daily.    Historical Provider, MD  guaiFENesin-codeine 100-10 MG/5ML syrup Take 5 mLs by mouth every 6 (six) hours as needed for cough. 08/06/15   Courteney Lyn Mackuen, MD  HYDROcodone-acetaminophen (NORCO/VICODIN) 5-325 MG per tablet Take 1 tablet by mouth every 4 (four) hours as needed. Patient not taking: Reported on 08/03/2015 08/20/14   Janne NapoleonHope M Neese, NP  ondansetron (ZOFRAN) 4 MG tablet Take 1 tablet (4 mg total) by mouth every 8 (eight) hours as needed for nausea or vomiting. 08/06/15   Courteney Lyn Mackuen, MD  predniSONE (DELTASONE) 10 MG tablet Take 6 tablets day one, 5 tablets day two, 4 tablets day three, 3 tablets day four, 2 tablets day five, then 1 tablet day six Patient not taking: Reported on 08/03/2015 08/23/14  Ziaire Hagos, PA-C  ranitidine (ZANTAC) 150 MG tablet Take 150 mg by mouth 2 (two) times daily.    Historical Provider, MD   BP 118/79 mmHg  Pulse 84  Temp(Src) 98.1 F (36.7 C) (Oral)  Resp 16  Ht 5' (1.524 m)  Wt 70.308 kg  BMI 30.27 kg/m2  SpO2 98%  LMP 04/23/2016 Physical Exam  Constitutional: She is oriented to person, place, and time. She appears well-developed and well-nourished. No distress.  HENT:  Head: Normocephalic and atraumatic.  Mouth/Throat: Uvula is midline, oropharynx is clear and moist and mucous membranes are normal. No uvula swelling. No oropharyngeal exudate.  Left middle ear effusion present.  No drainage or edema of the ear canal, TM appears intact.     Neck: Normal range of motion. Neck supple.  Cardiovascular: Normal rate, regular rhythm, normal heart sounds and intact distal pulses.   No murmur heard. Pulmonary/Chest: Effort normal and breath sounds normal. No stridor. No respiratory distress.  Musculoskeletal: Normal range of motion. She exhibits tenderness.  ttp of the medial right foot.  No open wounds or erythema.  No proximal tenderness.  NV intact  Lymphadenopathy:    She has no cervical adenopathy.  Neurological: She is alert and oriented to person, place, and time. Coordination normal.  Skin: Skin is warm and dry. No rash noted.  Nursing note and vitals reviewed.   ED Course  Procedures (including critical care time) Labs Review Labs Reviewed - No data to display  Imaging Review No results found. I have personally reviewed and evaluated these images and lab results as part of my medical decision-making.   EKG Interpretation None      MDM   Final diagnoses:  Otalgia, left  Plantar fasciitis of right foot    Pt well appearing.  Non-toxic.  No concerning sx's for cellulitis of the foot.  NV intact.  Agrees to symptomatic tx and close podiatry f/u if needed.     Pauline Aus, PA-C 04/30/16 4540  Donnetta Hutching, MD 04/30/16 [phone]

## 2016-04-30 NOTE — ED Notes (Signed)
Pt made aware to return if symptoms worsen or if any life threatening symptoms occur.   

## 2017-04-14 ENCOUNTER — Emergency Department (HOSPITAL_COMMUNITY)
Admission: EM | Admit: 2017-04-14 | Discharge: 2017-04-14 | Disposition: A | Discharge status: Home / Self Care | Payer: Self-pay | Attending: Emergency Medicine | Admitting: Emergency Medicine

## 2017-04-14 ENCOUNTER — Encounter (HOSPITAL_COMMUNITY): Payer: Self-pay

## 2017-04-14 DIAGNOSIS — R6884 Jaw pain: Secondary | ICD-10-CM | POA: Insufficient documentation

## 2017-04-14 DIAGNOSIS — Z87891 Personal history of nicotine dependence: Secondary | ICD-10-CM | POA: Insufficient documentation

## 2017-04-14 DIAGNOSIS — M26622 Arthralgia of left temporomandibular joint: Secondary | ICD-10-CM

## 2017-04-14 DIAGNOSIS — Z79899 Other long term (current) drug therapy: Secondary | ICD-10-CM | POA: Insufficient documentation

## 2017-04-14 MED ORDER — DEXAMETHASONE SODIUM PHOSPHATE 10 MG/ML IJ SOLN
10.0000 mg | Freq: Once | INTRAMUSCULAR | Status: AC
  Administered 2017-04-14: 10 mg via INTRAMUSCULAR
  Filled 2017-04-14: qty 1

## 2017-04-14 MED ORDER — NAPROXEN 500 MG PO TABS: 500.0000 mg | ORAL_TABLET | Freq: Two times a day (BID) | ORAL | 0 refills | Status: DC | PRN

## 2017-04-14 NOTE — ED Provider Notes (Signed)
[facility]-EMERGENCY DEPT Provider Note   CSN: [id number] Arrival date & time: 04/14/17  0740   By signing my name below, I, Clovis Pu, attest that this documentation has been prepared under the direction and in the presence of Blane Ohara, MD  Electronically Signed: Clovis Pu, ED Scribe. 04/14/17. 8:35 AM.   History   Chief Complaint Chief Complaint  Patient presents with  . Jaw Pain   HPI Comments:  Jacqueline [name] is a 41 y.o. female who presents to the Emergency Department complaining of acute onset, gradually worsening left sided jaw pain x 6 months which worsened this past month. She also reports associated facial swelling, left ear pain, intermittent left sided headaches, subjective fevers and notes she sometimes hears her jaw clicking. She reports a history of teeth grinding for which she now wears a mouth guard. Pt has been taking Bayer and ibuprofen with mild relief. Pt denies speech difficulty, weakness, SOB, dental problems or any other associated symptoms. She is a non-smoker. Pt denies any other complaints at this time.   The history is provided by the patient. No language interpreter was used.    Past Medical History:  Diagnosis Date  . Kidney stones    hx UTI  . Sinusitis   . URI (upper respiratory infection)   . UTI (lower urinary tract infection)     There are no active problems to display for this patient.   Past Surgical History:  Procedure Laterality Date  . TUBAL LIGATION      OB History    Gravida Para Term Preterm AB Living   SAB TAB Ectopic Multiple Live Births                   Home Medications    Prior to Admission medications   Medication Sig Start Date End Date Taking? Authorizing Provider  azithromycin (ZITHROMAX) 250 MG tablet Take 1 tablet (250 mg total) by mouth daily. Take first 2 tablets together, then 1 every day until finished. 04/30/16   Tammy Triplett, PA-C  benzonatate (TESSALON PERLES) 100 MG capsule Take 1  capsule (100 mg total) by mouth 3 (three) times daily as needed for cough. 08/06/15   Courteney Lyn Mackuen, MD  guaiFENesin-codeine 100-10 MG/5ML syrup Take 5 mLs by mouth every 6 (six) hours as needed for cough. 08/06/15   Courteney Lyn Mackuen, MD  naproxen (NAPROSYN) 500 MG tablet Take 1 tablet (500 mg total) by mouth 2 (two) times daily with a meal. 04/30/16   Tammy Triplett, PA-C  ondansetron (ZOFRAN) 4 MG tablet Take 1 tablet (4 mg total) by mouth every 8 (eight) hours as needed for nausea or vomiting. 08/06/15   Courteney Lyn Mackuen, MD  ranitidine (ZANTAC) 150 MG tablet Take 150 mg by mouth 2 (two) times daily.    Historical Provider, MD    Family History Family History  Problem Relation Age of Onset  . Diabetes Other     Social History Social History  Substance Use Topics  . Smoking status: Former Smoker    Packs/day: 0.50    Years: 4.00    Types: Cigarettes  . Smokeless tobacco: Never Used  . Alcohol use No     Allergies   Penicillins   Review of Systems Review of Systems  Constitutional: Positive for fever (subjective).  HENT: Positive for ear pain. Negative for dental problem.   Respiratory: Negative for shortness of breath.   Musculoskeletal:  Positive for myalgias.  Neurological: Negative for speech difficulty and weakness.  All other systems reviewed and are negative.    Physical Exam Updated Vital Signs BP 118/75 (BP Location: Left Arm)   Pulse 98   Temp 97.9 F (36.6 C)   Resp 16   Wt 175 lb (79.4 kg)   LMP 04/14/2017   SpO2 100%   BMI 34.18 kg/m   Physical Exam  Constitutional: She is oriented to person, place, and time. She appears well-developed and well-nourished. No distress.  HENT:  Head: Normocephalic and atraumatic.  Right Ear: No drainage.  Left Ear: No drainage.  Mouth/Throat: No trismus in the jaw.  No trismus. Pt can open mouth fully. Tenderness around left TMJ and lateral face. No external signs of infection. No induration. No  significant lymphadenopathy. Small amount of fluid behind left TM. No drainage. No tenderness with pulling the ear. Mild cerumen in right TM, no drainage.   Eyes: Conjunctivae are normal.  Neck: Neck supple.  Cardiovascular: Normal rate.   Pulmonary/Chest: Effort normal.  Abdominal: She exhibits no distension.  Neurological: She is alert and oriented to person, place, and time.  Skin: Skin is warm and dry.  Psychiatric: She has a normal mood and affect.  Nursing note and vitals reviewed.    ED Treatments / Results  DIAGNOSTIC STUDIES:  Oxygen Saturation is 100% on RA, normal by my interpretation.    COORDINATION OF CARE:  8:21 AM Discussed treatment plan with pt at bedside and pt agreed to plan.  Labs (all labs ordered are listed, but only abnormal results are displayed) Labs Reviewed - No data to display  EKG  EKG Interpretation None       Radiology No results found.  Procedures Procedures (including critical care time)  Medications Ordered in ED Medications  dexamethasone (DECADRON) injection 10 mg (10 mg Intramuscular Given 04/14/17 0826)     Initial Impression / Assessment and Plan / ED Course  I have reviewed the triage vital signs and the nursing notes.  Pertinent labs & imaging results that were available during my care of the patient were reviewed by me and considered in my medical decision making (see chart for details).   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 No signs of infection on exam. Patient recently saw a dentist. Discussed steroid shot, anti-inflammatories and follow-up with ENT.  Results and differential diagnosis were discussed with the patient/parent/guardian. Xrays were independently reviewed by myself.  Close follow up outpatient was discussed, comfortable with the plan.   Medications  dexamethasone (DECADRON) injection 10 mg (10 mg Intramuscular Given 04/14/17 0826)    Vitals:     04/14/17 0745 04/14/17 0746 04/14/17 0750  BP:   118/75  Pulse: 98    Resp: 16    Temp: 97.9 F (36.6 C)    SpO2: 100%    Weight:  175 lb (79.4 kg)     Final diagnoses:  TMJ tenderness, left     Final Clinical Impressions(s) / ED Diagnoses   Final diagnoses:  TMJ tenderness, left    New Prescriptions New Prescriptions   No medications on file      Blane Ohara, MD 04/14/17 [phone]

## 2017-04-14 NOTE — ED Triage Notes (Signed)
Pt reports left sided jaw pain for 6 months with increasing pain over the last one month. Jaw feels like it locks up. Feels swollen and warm to touch. Pain radiates to ear Has been taking ibuprofen and bayer aspirin for pain.

## 2017-04-14 NOTE — Discharge Instructions (Signed)
Continue soft foods and dental follow up. See ENT if no improvement.  Return to ER for fevers, weight loss or other concerns.   If you were given medicines take as directed.  If you are on coumadin or contraceptives realize their levels and effectiveness is altered by many different medicines.  If you have any reaction (rash, tongues swelling, other) to the medicines stop taking and see a physician.    If your blood pressure was elevated in the ER make sure you follow up for management with a primary doctor or return for chest pain, shortness of breath or stroke symptoms.  Please follow up as directed and return to the ER or see a physician for new or worsening symptoms.  Thank you. Vitals:   04/14/17 0745 04/14/17 0746 04/14/17 0750  BP:   118/75  Pulse: 98    Resp: 16    Temp: 97.9 F (36.6 C)    SpO2: 100%    Weight:  175 lb (79.4 kg)

## 2017-10-07 ENCOUNTER — Other Ambulatory Visit: Payer: Self-pay | Admitting: Obstetrics and Gynecology

## 2017-10-07 DIAGNOSIS — R921 Mammographic calcification found on diagnostic imaging of breast: Secondary | ICD-10-CM

## 2017-12-26 ENCOUNTER — Emergency Department (HOSPITAL_COMMUNITY)
Admission: EM | Admit: 2017-12-26 | Discharge: 2017-12-26 | Disposition: A | Discharge status: Home / Self Care | Payer: Self-pay | Attending: Emergency Medicine | Admitting: Emergency Medicine

## 2017-12-26 ENCOUNTER — Encounter (HOSPITAL_COMMUNITY): Payer: Self-pay | Admitting: Emergency Medicine

## 2017-12-26 ENCOUNTER — Other Ambulatory Visit: Payer: Self-pay

## 2017-12-26 DIAGNOSIS — Z5321 Procedure and treatment not carried out due to patient leaving prior to being seen by health care provider: Secondary | ICD-10-CM | POA: Insufficient documentation

## 2017-12-26 DIAGNOSIS — R0981 Nasal congestion: Secondary | ICD-10-CM | POA: Insufficient documentation

## 2017-12-26 NOTE — ED Triage Notes (Signed)
Patient complains of sinus congestion x 2 days.

## 2017-12-26 NOTE — ED Notes (Signed)
Called for room placement, no answer at this time.

## 2017-12-26 NOTE — ED Notes (Signed)
Called pt for room placement, no answer at this time.

## 2017-12-26 NOTE — ED Notes (Signed)
Called for room placement, no answer at this time.  

## 2018-03-12 ENCOUNTER — Other Ambulatory Visit: Payer: Self-pay

## 2018-03-12 ENCOUNTER — Encounter (HOSPITAL_COMMUNITY): Payer: Self-pay | Admitting: Emergency Medicine

## 2018-03-12 ENCOUNTER — Emergency Department (HOSPITAL_COMMUNITY)
Admission: EM | Admit: 2018-03-12 | Discharge: 2018-03-12 | Disposition: A | Discharge status: Home / Self Care | Payer: Self-pay | Attending: Emergency Medicine | Admitting: Emergency Medicine

## 2018-03-12 DIAGNOSIS — H9202 Otalgia, left ear: Secondary | ICD-10-CM | POA: Insufficient documentation

## 2018-03-12 DIAGNOSIS — H66002 Acute suppurative otitis media without spontaneous rupture of ear drum, left ear: Secondary | ICD-10-CM | POA: Insufficient documentation

## 2018-03-12 DIAGNOSIS — Z79899 Other long term (current) drug therapy: Secondary | ICD-10-CM | POA: Insufficient documentation

## 2018-03-12 DIAGNOSIS — R51 Headache: Secondary | ICD-10-CM | POA: Insufficient documentation

## 2018-03-12 DIAGNOSIS — Z87891 Personal history of nicotine dependence: Secondary | ICD-10-CM | POA: Insufficient documentation

## 2018-03-12 MED ORDER — CEFDINIR 300 MG PO CAPS: 300.0000 mg | ORAL_CAPSULE | Freq: Two times a day (BID) | ORAL | 0 refills | Status: DC

## 2018-03-12 MED ORDER — PSEUDOEPHEDRINE HCL 60 MG PO TABS: 60.0000 mg | ORAL_TABLET | Freq: Four times a day (QID) | ORAL | 0 refills | Status: DC | PRN

## 2018-03-12 NOTE — ED Triage Notes (Signed)
Pt c/o nasal congestion, sinus pain/tenderness, and HA since Monday.

## 2018-03-12 NOTE — Discharge Instructions (Signed)
Also try taking sudafed as directed for 1 week.  Follow-up with your doctor for recheck if needed

## 2018-03-12 NOTE — ED Provider Notes (Signed)
[facility] EMERGENCY DEPARTMENT Provider Note   CSN: [id number] Arrival date & time: 03/12/18  4098     History   Chief Complaint Chief Complaint  Patient presents with  . Nasal Congestion    HPI Jacqueline [name] is a 42 y.o. female.  HPI   Jacqueline [name] is a 42 y.o. female who presents to the Emergency Department complaining of facial pressure and pain, nasal congestion, left ear pain and frontal headache.  Symptoms have been present for 4 days.  She states that she was recently diagnosed with left-sided trigeminal neuralgia.  She states that she is awaiting for follow-up at the clinic for possible financial assistance to see a neurologist.  She states that her facial symptoms and congestion have been worsening.  She denies known fever, cough or chest congestion, neck pain, or facial swelling.  She has not tried any over-the-counter medications prior to arrival.  She states she is wearing a bite block since been diagnosed with trigeminal neuralgia.  Past Medical History:  Diagnosis Date  . Kidney stones    hx UTI  . Sinusitis   . URI (upper respiratory infection)   . UTI (lower urinary tract infection)     There are no active problems to display for this patient.   Past Surgical History:  Procedure Laterality Date  . TUBAL LIGATION      OB History    Gravida Para Term Preterm AB Living   3 3 3     3    SAB TAB Ectopic Multiple Live Births                   Home Medications    Prior to Admission medications   Medication Sig Start Date End Date Taking? Authorizing Provider  azithromycin (ZITHROMAX) 250 MG tablet Take 1 tablet (250 mg total) by mouth daily. Take first 2 tablets together, then 1 every day until finished. 04/30/16   Omari Koslosky, PA-C  benzonatate (TESSALON PERLES) 100 MG capsule Take 1 capsule (100 mg total) by mouth 3 (three) times daily as needed for cough. 08/06/15   Mackuen, Courteney Lyn, MD  cefdinir (OMNICEF) 300 MG capsule Take 1 capsule (300 mg  total) by mouth 2 (two) times daily. For 10 days 03/12/18   Angelynn Lemus, PA-C  guaiFENesin-codeine 100-10 MG/5ML syrup Take 5 mLs by mouth every 6 (six) hours as needed for cough. 08/06/15   Mackuen, Courteney Lyn, MD  naproxen (NAPROSYN) 500 MG tablet Take 1 tablet (500 mg total) by mouth 2 (two) times daily as needed. 04/14/17   Blane Ohara, MD  ondansetron (ZOFRAN) 4 MG tablet Take 1 tablet (4 mg total) by mouth every 8 (eight) hours as needed for nausea or vomiting. 08/06/15   Mackuen, Courteney Lyn, MD  pseudoephedrine (SUDAFED) 60 MG tablet Take 1 tablet (60 mg total) by mouth every 6 (six) hours as needed for congestion. 03/12/18   Giovannie Scerbo, PA-C  ranitidine (ZANTAC) 150 MG tablet Take 150 mg by mouth 2 (two) times daily.    [provider]    Family History Family History  Problem Relation Age of Onset  . Diabetes Other     Social History Social History   Tobacco Use  . Smoking status: Former Smoker    Packs/day: 0.50    Years: 4.00    Pack years: 2.00    Types: Cigarettes  . Smokeless tobacco: Never Used  Substance Use Topics  . Alcohol use: No  . Drug  use: No     Allergies   Penicillins   Review of Systems Review of Systems  Constitutional: Negative for activity change, appetite change, chills and fever.  HENT: Positive for congestion, ear pain, sinus pressure, sinus pain and sore throat. Negative for facial swelling, rhinorrhea and trouble swallowing.   Eyes: Negative for visual disturbance.  Respiratory: Negative for cough, shortness of breath, wheezing and stridor.   Gastrointestinal: Negative for abdominal pain, nausea and vomiting.  Musculoskeletal: Negative for neck pain and neck stiffness.  Skin: Negative for rash.  Neurological: Positive for headaches. Negative for dizziness, syncope, weakness and numbness.  Hematological: Negative for adenopathy.  All other systems reviewed and are negative.    Physical Exam Updated Vital Signs BP  123/86 (BP Location: Right Arm)   Pulse 96   Temp 98.7 F (37.1 C) (Oral)   Resp 16   Ht 5' (1.524 m)   Wt 77.1 kg (170 lb)   LMP 02/21/2018 (Approximate)   SpO2 100%   BMI 33.20 kg/m   Physical Exam  Constitutional: She is oriented to person, place, and time. She appears well-developed and well-nourished. No distress.  HENT:  Head: Normocephalic and atraumatic.  Right Ear: Tympanic membrane and ear canal normal.  Left Ear: Ear canal normal. There is tenderness. No mastoid tenderness. Tympanic membrane is bulging. No hemotympanum.  Nose: Mucosal edema present. No rhinorrhea. Right sinus exhibits maxillary sinus tenderness. Left sinus exhibits maxillary sinus tenderness.  Mouth/Throat: Uvula is midline and mucous membranes are normal. No trismus in the jaw. No uvula swelling. Posterior oropharyngeal erythema present. No oropharyngeal exudate, posterior oropharyngeal edema or tonsillar abscesses.  Mild purulence noted at the left TM.  No obvious perforation.  No edema of the canal.  No mastoid tenderness  Eyes: Conjunctivae are normal.  Neck: Normal range of motion and phonation normal. Neck supple. No Brudzinski's sign and no Kernig's sign noted.  Cardiovascular: Normal rate, regular rhythm and intact distal pulses.  No murmur heard. Pulmonary/Chest: Effort normal and breath sounds normal. No respiratory distress. She has no wheezes. She has no rales.  Abdominal: Soft.  Musculoskeletal: She exhibits no edema.  Lymphadenopathy:    She has no cervical adenopathy.  Neurological: She is alert and oriented to person, place, and time. She exhibits normal muscle tone. Coordination normal.  Skin: Skin is warm. No rash noted.  Nursing note and vitals reviewed.    ED Treatments / Results  Labs (all labs ordered are listed, but only abnormal results are displayed) Labs Reviewed - No data to display  EKG  EKG Interpretation None       Radiology No results  found.  Procedures Procedures (including critical care time)  Medications Ordered in ED Medications - No data to display   Initial Impression / Assessment and Plan / ED Course  I have reviewed the triage vital signs and the nursing notes.  Pertinent labs & imaging results that were available during my care of the patient were reviewed by me and considered in my medical decision making (see chart for details).     Patient well-appearing.  Vitals reassuring.  No nuchal rigidity.  Left otitis media is present.  Will treat with antibiotics and patient agrees to over-the-counter Sudafed for nasal congestion.  She appears safe for discharge home.  She agrees to treatment plan and outpatient follow-up if needed.  Final Clinical Impressions(s) / ED Diagnoses   Final diagnoses:  Acute suppurative otitis media of left ear without spontaneous rupture of  tympanic membrane, recurrence not specified    ED Discharge Orders        Ordered    cefdinir (OMNICEF) 300 MG capsule  2 times daily     03/12/18 1042    pseudoephedrine (SUDAFED) 60 MG tablet  Every 6 hours PRN     03/12/18 1044       Daeveon Zweber, [place], PA-C 03/12/18 1118    Blane OharaZavitz, Joshua, MD 03/12/18 1553

## 2018-03-18 ENCOUNTER — Ambulatory Visit: Payer: Self-pay | Admitting: Physician Assistant

## 2018-03-18 ENCOUNTER — Encounter: Payer: Self-pay | Admitting: Physician Assistant

## 2018-03-18 VITALS — BP 110/82 | HR 83 | Temp 97.9°F | Ht 60.75 in | Wt 175.5 lb

## 2018-03-18 DIAGNOSIS — G500 Trigeminal neuralgia: Secondary | ICD-10-CM

## 2018-03-18 DIAGNOSIS — Z13220 Encounter for screening for lipoid disorders: Secondary | ICD-10-CM

## 2018-03-18 DIAGNOSIS — Z131 Encounter for screening for diabetes mellitus: Secondary | ICD-10-CM

## 2018-03-18 DIAGNOSIS — Z7689 Persons encountering health services in other specified circumstances: Secondary | ICD-10-CM

## 2018-03-18 NOTE — Progress Notes (Signed)
BP 110/82 (BP Location: Left Arm, Patient Position: Sitting, Cuff Size: Normal)   Pulse 83   Temp 97.9 F (36.6 C)   Ht 5' 0.75" (1.543 m)   Wt 175 lb 8 oz (79.6 kg)   LMP 02/21/2018 (Approximate)   SpO2 97%   BMI 33.43 kg/m    Subjective:    Patient ID: Jacqueline [name], female    DOB: [birth year], 42 y.o.   MRN: [id number]  HPI: Jacqueline [name] is a 42 y.o. female presenting on 03/18/2018 for New Patient (Initial Visit) (pt states she usually has been going to [facility]. pt states last time she had PCP was 4 years ago. pt [facility] and Facial Pain (on Left side. pt states pain began a little over a year ago. pt states she has been taking tylenol and advil but does not work. pt states she grinds and clenches her teeth)   HPI Chief Complaint  Patient presents with  . New Patient (Initial Visit)    pt states she usually has been going to [facility]. pt states last time she had PCP was 4 years ago. pt [facility]  . Facial Pain    on Left side. pt states pain began a little over a year ago. pt states she has been taking tylenol and advil but does not work. pt states she grinds and clenches her teeth    Pt was Seen in ER earlier this week.  Pt Told provider that she has L sided trigeminal neuralgia and was waiting to see neurologist.   Notes says she is wearing a bite block.   She was dx with LOM and given abx- cefdinir and sudafed    Pt seen in ER for Jaw Pain in April 2018 as well.   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  She was told to follow up with ENT  Pt seen in ER for L facial and ear pain in May 2017.   No dx other than otalgia- exam benign per note.  Pt states last PAP was about 3 or 4 years.  [facility]   Pt says she has never been able to see neurologist or ENT for her face pain due to no insurance and lack on money.    Relevant past medical, surgical, family and social history reviewed and updated as indicated. Interim medical history since our last visit reviewed. Allergies and medications reviewed and updated.   Current Outpatient Medications:  .  acetaminophen (TYLENOL) 650 MG CR tablet, Take 650 mg by mouth every 8 (eight) hours as needed for pain., Disp: , Rfl:  .  cefdinir (OMNICEF) 300 MG capsule, Take 1 capsule (300 mg total) by mouth 2 (two) times daily. For 10 days, Disp: 20 capsule, Rfl: 0 .  ibuprofen (ADVIL,MOTRIN) 200 MG tablet, Take 600 mg by mouth every 6 (six) hours as needed., Disp: , Rfl:  .  pseudoephedrine (SUDAFED) 60 MG tablet, Take 1 tablet (60 mg total) by mouth every 6 (six) hours as needed for congestion., Disp: 20 tablet, Rfl: 0 .  ranitidine (ZANTAC) 150 MG tablet, Take 150 mg by mouth 2 (two) times daily., Disp: , Rfl:    Review of Systems  Constitutional: Positive for appetite change, chills, diaphoresis, fatigue and fever. Negative for unexpected weight change.  HENT: Positive for congestion, dental problem, ear pain, facial swelling and sneezing. Negative for drooling, hearing loss, mouth sores, sore throat, trouble swallowing and voice change.   Eyes: Positive for pain and itching. Negative for discharge, redness and visual disturbance.  Respiratory: Negative for cough, choking, shortness of breath and wheezing.   Cardiovascular: Negative for chest pain, palpitations and leg swelling.  Gastrointestinal: Negative for abdominal pain, blood in stool, constipation, diarrhea and vomiting.  Endocrine: Positive for cold intolerance and heat intolerance. Negative for polydipsia.  Genitourinary: Negative for decreased urine volume, dysuria and hematuria.  Musculoskeletal: Negative for arthralgias, back pain and gait problem.  Skin: Negative for rash.  Allergic/Immunologic: Positive for environmental allergies.  Neurological: Positive for light-headedness and headaches. Negative  for seizures and syncope.  Hematological: Negative for adenopathy.  Psychiatric/Behavioral: Negative for agitation, dysphoric mood and suicidal ideas. The patient is nervous/anxious.     Per HPI unless specifically indicated above     Objective:    BP 110/82 (BP Location: Left Arm, Patient Position: Sitting, Cuff Size: Normal)   Pulse 83   Temp 97.9 F (36.6 C)   Ht 5' 0.75" (1.543 m)   Wt 175 lb 8 oz (79.6 kg)   LMP 02/21/2018 (Approximate)   SpO2 97%   BMI 33.43 kg/m   Wt Readings from Last 3 Encounters:  03/18/18 175 lb 8 oz (79.6 kg)  03/12/18 170 lb (77.1 kg)  12/26/17 170 lb (77.1 kg)    Physical Exam  Constitutional: She is oriented to person, place, and time. She appears well-developed and well-nourished.  HENT:  Head: Normocephalic and atraumatic.  Right Ear: Hearing, tympanic membrane, external ear and ear canal normal.  Left Ear: Hearing, tympanic membrane, external ear and ear canal normal.  Nose: Nose normal.  Mouth/Throat: Uvula is midline and oropharynx is clear and moist. No oropharyngeal exudate.  Eyes: Pupils are equal, round, and reactive to light. Conjunctivae and EOM are normal.  Neck: Neck supple. No thyromegaly present.  Cardiovascular: Normal rate and regular rhythm.  Pulmonary/Chest: Effort normal and breath sounds normal. She has no wheezes.  Abdominal: Soft. Bowel sounds are normal. She exhibits no mass. There is no hepatosplenomegaly. There is no tenderness.  Musculoskeletal: She exhibits no edema.  Lymphadenopathy:    She has no cervical adenopathy.  Neurological: She is alert and oriented to person, place, and time. She has normal strength and normal reflexes. She displays no tremor. No cranial nerve deficit or sensory deficit. She exhibits normal muscle tone. Coordination and gait normal.  Pt states pain L face with cotton touch testing  Skin: Skin is warm and dry.  Psychiatric: She has a normal mood and affect. Her behavior is normal.   Vitals reviewed.       Assessment & Plan:   Encounter Diagnoses  Name Primary?  . Encounter to establish care Yes  . Screening cholesterol level   . Screening for diabetes mellitus   . Trigeminal neuralgia     -will Refer to neurology -will get baseline Labs -pt was given [facility] care application -Will update PAP this year -pt will follow up 1 month.  RTO sooner prn

## 2018-04-08 ENCOUNTER — Encounter: Payer: Self-pay | Admitting: Physician Assistant

## 2018-04-08 ENCOUNTER — Ambulatory Visit: Payer: Self-pay | Admitting: Physician Assistant

## 2018-04-08 VITALS — BP 113/79 | HR 82 | Temp 98.4°F | Ht 60.75 in | Wt 174.8 lb

## 2018-04-08 DIAGNOSIS — H6980 Other specified disorders of Eustachian tube, unspecified ear: Secondary | ICD-10-CM

## 2018-04-08 NOTE — Progress Notes (Signed)
BP 113/79 (BP Location: Right Arm, Patient Position: Sitting, Cuff Size: Normal)   Pulse 82   Temp 98.4 F (36.9 C) (Other (Comment))   Ht 5' 0.75" (1.543 m)   Wt 174 lb 12 oz (79.3 kg)   SpO2 97%   BMI 33.29 kg/m    Subjective:    Patient ID: Jacqueline [name], female    DOB: 1976/11/27, 42 y.o.   MRN: [id number]  HPI: Jacqueline [name] is a 42 y.o. female presenting on 04/08/2018 for Ear Pain (after completed antibiotic pain stopped, but in the last 2-3 days pain has started in both ears, and has some swelling to left face and neck, ran fever yestrday)   HPI  Chief Complaint  Patient presents with  . Ear Pain    after completed antibiotic pain stopped, but in the last 2-3 days pain has started in both ears, and has some swelling to left face and neck, ran fever yestrday   Pt got cefdinir in ER on 03/12/18 and she says she finished the antibiotic.  Pt states facial pain got better so she doesn't need neurologist now  Pt has not gotten labs drawn that were ordered 03/18/18  Relevant past medical, surgical, family and social history reviewed and updated as indicated. Interim medical history since our last visit reviewed. Allergies and medications reviewed and updated.   Current Outpatient Medications:  .  acetaminophen (TYLENOL) 650 MG CR tablet, Take 650 mg by mouth every 8 (eight) hours as needed for pain., Disp: , Rfl:  .  ibuprofen (ADVIL,MOTRIN) 200 MG tablet, Take 600 mg by mouth every 6 (six) hours as needed., Disp: , Rfl:  .  ranitidine (ZANTAC) 150 MG tablet, Take 150 mg by mouth 2 (two) times daily., Disp: , Rfl:    Review of Systems  Constitutional: Positive for chills, fatigue and fever. Negative for appetite change, diaphoresis and unexpected weight change.  HENT: Positive for ear pain and facial swelling. Negative for congestion, dental problem, drooling, hearing loss, mouth sores, sneezing, sore throat, trouble swallowing and voice change.   Eyes: Negative for pain,  discharge, redness, itching and visual disturbance.  Respiratory: Negative for cough, choking, shortness of breath and wheezing.   Cardiovascular: Negative for chest pain, palpitations and leg swelling.  Gastrointestinal: Negative for abdominal pain, blood in stool, constipation, diarrhea and vomiting.  Endocrine: Negative for cold intolerance, heat intolerance and polydipsia.  Genitourinary: Negative for decreased urine volume, dysuria and hematuria.  Musculoskeletal: Negative for arthralgias, back pain and gait problem.  Skin: Negative for rash.  Allergic/Immunologic: Positive for environmental allergies.  Neurological: Positive for light-headedness and headaches. Negative for seizures and syncope.  Hematological: Negative for adenopathy.  Psychiatric/Behavioral: Negative for agitation, dysphoric mood and suicidal ideas. The patient is not nervous/anxious.     Per HPI unless specifically indicated above     Objective:    BP 113/79 (BP Location: Right Arm, Patient Position: Sitting, Cuff Size: Normal)   Pulse 82   Temp 98.4 F (36.9 C) (Other (Comment))   Ht 5' 0.75" (1.543 m)   Wt 174 lb 12 oz (79.3 kg)   SpO2 97%   BMI 33.29 kg/m   Wt Readings from Last 3 Encounters:  04/08/18 174 lb 12 oz (79.3 kg)  03/18/18 175 lb 8 oz (79.6 kg)  03/12/18 170 lb (77.1 kg)    Physical Exam  Constitutional: She is oriented to person, place, and time. She appears well-developed and well-nourished.  HENT:  Head: Normocephalic  and atraumatic.  Right Ear: Hearing, tympanic membrane, external ear and ear canal normal.  Left Ear: Hearing, tympanic membrane, external ear and ear canal normal.  Nose: Nose normal.  Mouth/Throat: Uvula is midline and oropharynx is clear and moist. No oropharyngeal exudate.  Neck: Neck supple.  Cardiovascular: Normal rate and regular rhythm.  Pulmonary/Chest: Effort normal and breath sounds normal. She has no wheezes.  Lymphadenopathy:    She has no cervical  adenopathy.  Neurological: She is alert and oriented to person, place, and time.  Skin: Skin is warm and dry.  Psychiatric: She has a normal mood and affect. Her behavior is normal.  Vitals reviewed.       Assessment & Plan:   Encounter Diagnosis  Name Primary?  . Dysfunction of Eustachian tube, unspecified laterality Yes    -Pt counseled to get on sudafed for a week or so.  She is counseled on dx and given reading information -Pt counseled to get her labs drawn -pt to follow up as scheduled.  RTO sooner prn

## 2018-04-08 NOTE — Patient Instructions (Signed)
Eustachian Tube Dysfunction The eustachian tube connects the middle ear to the back of the nose. It regulates air pressure in the middle ear by allowing air to move between the ear and nose. It also helps to drain fluid from the middle ear space. When the eustachian tube does not function properly, air pressure, fluid, or both can build up in the middle ear. Eustachian tube dysfunction can affect one or both ears. What are the causes? This condition happens when the eustachian tube becomes blocked or cannot open normally. This may result from:  Ear infections.  Colds and other upper respiratory infections.  Allergies.  Irritation, such as from cigarette smoke or acid from the stomach coming up into the esophagus (gastroesophageal reflux).  Sudden changes in air pressure, such as from descending in an airplane.  Abnormal growths in the nose or throat, such as nasal polyps, tumors, or enlarged tissue at the back of the throat (adenoids).  What increases the risk? This condition may be more likely to develop in people who smoke and people who are overweight. Eustachian tube dysfunction may also be more likely to develop in children, especially children who have:  Certain birth defects of the mouth, such as cleft palate.  Large tonsils and adenoids.  What are the signs or symptoms? Symptoms of this condition may include:  A feeling of fullness in the ear.  Ear pain.  Clicking or popping noises in the ear.  Ringing in the ear.  Hearing loss.  Loss of balance.  Symptoms may get worse when the air pressure around you changes, such as when you travel to an area of high elevation or fly on an airplane. How is this diagnosed? This condition may be diagnosed based on:  Your symptoms.  A physical exam of your ear, nose, and throat.  Tests, such as those that measure: ? The movement of your eardrum (tympanogram). ? Your hearing (audiometry).  How is this treated? Treatment  depends on the cause and severity of your condition. If your symptoms are mild, you may be able to relieve your symptoms by moving air into ("popping") your ears. If you have symptoms of fluid in your ears, treatment may include:  Decongestants.  Antihistamines.  Nasal sprays or ear drops that contain medicines that reduce swelling (steroids).  In some cases, you may need to have a procedure to drain the fluid in your eardrum (myringotomy). In this procedure, a small tube is placed in the eardrum to:  Drain the fluid.  Restore the air in the middle ear space.  Follow these instructions at home:  Take over-the-counter and prescription medicines only as told by your health care provider.  Use techniques to help pop your ears as recommended by your health care provider. These may include: ? Chewing gum. ? Yawning. ? Frequent, forceful swallowing. ? Closing your mouth, holding your nose closed, and gently blowing as if you are trying to blow air out of your nose.  Do not do any of the following until your health care provider approves: ? Travel to high altitudes. ? Fly in airplanes. ? Work in a pressurized cabin or room. ? Scuba dive.  Keep your ears dry. Dry your ears completely after showering or bathing.  Do not smoke.  Keep all follow-up visits as told by your health care provider. This is important. Contact a health care provider if:  Your symptoms do not go away after treatment.  Your symptoms come back after treatment.  You are   unable to pop your ears.  You have: ? A fever. ? Pain in your ear. ? Pain in your head or neck. ? Fluid draining from your ear.  Your hearing suddenly changes.  You become very dizzy.  You lose your balance. This information is not intended to replace advice given to you by your health care provider. Make sure you discuss any questions you have with your health care provider. Document Released: 01/11/2016 Document Revised: 05/22/2016  Document Reviewed: 01/03/2015 Elsevier Interactive Patient Education  2018 [employer]  

## 2018-04-14 ENCOUNTER — Other Ambulatory Visit (HOSPITAL_COMMUNITY)
Admission: RE | Admit: 2018-04-14 | Discharge: 2018-04-14 | Disposition: A | Discharge status: Home / Self Care | Payer: Self-pay | Source: Ambulatory Visit | Attending: Physician Assistant | Admitting: Physician Assistant

## 2018-04-14 DIAGNOSIS — Z13220 Encounter for screening for lipoid disorders: Secondary | ICD-10-CM | POA: Insufficient documentation

## 2018-04-14 DIAGNOSIS — Z131 Encounter for screening for diabetes mellitus: Secondary | ICD-10-CM | POA: Insufficient documentation

## 2018-04-14 LAB — COMPREHENSIVE METABOLIC PANEL
ALBUMIN: 4.2 g/dL (ref 3.5–5.0)
ALT: 12 U/L — ABNORMAL LOW (ref 14–54)
ANION GAP: 11 (ref 5–15)
AST: 16 U/L (ref 15–41)
Alkaline Phosphatase: 71 U/L (ref 38–126)
BILIRUBIN TOTAL: 0.4 mg/dL (ref 0.3–1.2)
BUN: 13 mg/dL (ref 6–20)
CO2: 22 mmol/L (ref 22–32)
Calcium: 9 mg/dL (ref 8.9–10.3)
Chloride: 104 mmol/L (ref 101–111)
Creatinine, Ser: 0.6 mg/dL (ref 0.44–1.00)
GFR calc Af Amer: >60 mL/min (ref >60)
GFR calc non Af Amer: >60 mL/min (ref >60)
GLUCOSE: 95 mg/dL (ref 65–99)
POTASSIUM: 3.7 mmol/L (ref 3.5–5.1)
SODIUM: 137 mmol/L (ref 135–145)
TOTAL PROTEIN: 7.7 g/dL (ref 6.5–8.1)

## 2018-04-14 LAB — LIPID PANEL
CHOL/HDL RATIO: 4.2 ratio
CHOLESTEROL: 184 mg/dL (ref 0–200)
HDL: 44 mg/dL (ref >40)
LDL CALC: 126 mg/dL — AB (ref 0–99)
Triglycerides: 72 mg/dL (ref <150)
VLDL: 14 mg/dL (ref 0–40)

## 2018-04-14 LAB — HEMOGLOBIN A1C
Hgb A1c MFr Bld: 5.2 % (ref 4.8–5.6)
Mean Plasma Glucose: 102.54 mg/dL

## 2018-04-27 ENCOUNTER — Ambulatory Visit: Payer: Self-pay | Admitting: Physician Assistant

## 2018-04-27 ENCOUNTER — Encounter: Payer: Self-pay | Admitting: Physician Assistant

## 2018-04-27 VITALS — BP 126/90 | HR 93 | Temp 98.1°F | Ht 60.75 in | Wt 172.5 lb

## 2018-04-27 DIAGNOSIS — E785 Hyperlipidemia, unspecified: Secondary | ICD-10-CM

## 2018-04-27 NOTE — Patient Instructions (Signed)

## 2018-04-27 NOTE — Progress Notes (Signed)
BP 126/90 (BP Location: Left Arm, Patient Position: Sitting, Cuff Size: Large)   Pulse 93   Temp 98.1 F (36.7 C) (Other (Comment))   Ht 5' 0.75" (1.543 m)   Wt 172 lb 8 oz (78.2 kg)   LMP 04/25/2018 (Exact Date)   SpO2 98%   BMI 32.86 kg/m    Subjective:    Patient ID: Jacqueline [name], female    DOB: July 20, 1976, 42 y.o.   MRN: [id number]  HPI: Jacqueline [name] is a 42 y.o. female presenting on 04/27/2018 for Follow-up   HPI   Pt is feeling well today.  She is so happy that her facial pain has gone away and that it wasn't trigeminal neuralgia after all.    Relevant past medical, surgical, family and social history reviewed and updated as indicated. Interim medical history since our last visit reviewed. Allergies and medications reviewed and updated.   Current Outpatient Medications:  .  ibuprofen (ADVIL,MOTRIN) 200 MG tablet, Take 600 mg by mouth every 6 (six) hours as needed., Disp: , Rfl:  .  loratadine (CLARITIN) 10 MG tablet, Take 10 mg by mouth daily as needed for allergies., Disp: , Rfl:  .  ranitidine (ZANTAC) 150 MG tablet, Take 150 mg by mouth 2 (two) times daily., Disp: , Rfl:    Review of Systems  Constitutional: Negative for appetite change, chills, diaphoresis, fatigue, fever and unexpected weight change.  HENT: Negative for congestion, dental problem, drooling, ear pain, facial swelling, hearing loss, mouth sores, sneezing, sore throat, trouble swallowing and voice change.   Eyes: Negative for pain, discharge, redness, itching and visual disturbance.  Respiratory: Negative for cough, choking, shortness of breath and wheezing.   Cardiovascular: Negative for chest pain, palpitations and leg swelling.  Gastrointestinal: Negative for abdominal pain, blood in stool, constipation, diarrhea and vomiting.  Endocrine: Negative for cold intolerance, heat intolerance and polydipsia.  Genitourinary: Negative for decreased urine volume, dysuria and hematuria.  Musculoskeletal:  Negative for arthralgias, back pain and gait problem.  Skin: Negative for rash.  Allergic/Immunologic: Positive for environmental allergies.  Neurological: Negative for seizures, syncope, light-headedness and headaches.  Hematological: Negative for adenopathy.  Psychiatric/Behavioral: Negative for agitation, dysphoric mood and suicidal ideas. The patient is not nervous/anxious.     Per HPI unless specifically indicated above     Objective:    BP 126/90 (BP Location: Left Arm, Patient Position: Sitting, Cuff Size: Large)   Pulse 93   Temp 98.1 F (36.7 C) (Other (Comment))   Ht 5' 0.75" (1.543 m)   Wt 172 lb 8 oz (78.2 kg)   LMP 04/25/2018 (Exact Date)   SpO2 98%   BMI 32.86 kg/m   Wt Readings from Last 3 Encounters:  04/27/18 172 lb 8 oz (78.2 kg)  04/08/18 174 lb 12 oz (79.3 kg)  03/18/18 175 lb 8 oz (79.6 kg)    Physical Exam  Constitutional: She is oriented to person, place, and time. She appears well-developed and well-nourished.  HENT:  Head: Normocephalic and atraumatic.  Neck: Neck supple.  Cardiovascular: Normal rate and regular rhythm.  Pulmonary/Chest: Effort normal and breath sounds normal.  Abdominal: Soft. Bowel sounds are normal. She exhibits no mass. There is no hepatosplenomegaly. There is no tenderness.  Musculoskeletal: She exhibits no edema.  Lymphadenopathy:    She has no cervical adenopathy.  Neurological: She is alert and oriented to person, place, and time.  Skin: Skin is warm and dry.  Psychiatric: She has a normal mood and  affect. Her behavior is normal.  Vitals reviewed.   Results for orders placed or performed during the hospital encounter of 04/14/18  Hemoglobin A1c  Result Value Ref Range   Hgb A1c MFr Bld 5.2 4.8 - 5.6 %   Mean Plasma Glucose 102.54 mg/dL  Lipid panel  Result Value Ref Range   Cholesterol 184 0 - 200 mg/dL   Triglycerides 72 <098 mg/dL   HDL 44 >11 mg/dL   Total CHOL/HDL Ratio 4.2 RATIO   VLDL 14 0 - 40 mg/dL    LDL Cholesterol 914 (H) 0 - 99 mg/dL  Comprehensive metabolic panel  Result Value Ref Range   Sodium 137 135 - 145 mmol/L   Potassium 3.7 3.5 - 5.1 mmol/L   Chloride 104 101 - 111 mmol/L   CO2 22 22 - 32 mmol/L   Glucose, Bld 95 65 - 99 mg/dL   BUN 13 6 - 20 mg/dL   Creatinine, Ser 7.82 0.44 - 1.00 mg/dL   Calcium 9.0 8.9 - 95.6 mg/dL   Total Protein 7.7 6.5 - 8.1 g/dL   Albumin 4.2 3.5 - 5.0 g/dL   AST 16 15 - 41 U/L   ALT 12 (L) 14 - 54 U/L   Alkaline Phosphatase 71 38 - 126 U/L   Total Bilirubin 0.4 0.3 - 1.2 mg/dL   GFR calc non Af Amer >60 >60 mL/min   GFR calc Af Amer >60 >60 mL/min   Anion gap 11 5 - 15      Assessment & Plan:   Encounter Diagnosis  Name Primary?  . Hyperlipidemia, unspecified hyperlipidemia type Yes    -reviewed labs with pt -counseled low fat diet -pt to follow up in 1 week to update PAP.

## 2018-05-04 ENCOUNTER — Encounter: Payer: Self-pay | Admitting: Physician Assistant

## 2018-05-04 ENCOUNTER — Other Ambulatory Visit (HOSPITAL_COMMUNITY)
Admission: RE | Admit: 2018-05-04 | Discharge: 2018-05-04 | Disposition: A | Discharge status: Home / Self Care | Payer: Self-pay | Source: Ambulatory Visit | Attending: Physician Assistant | Admitting: Physician Assistant

## 2018-05-04 ENCOUNTER — Ambulatory Visit: Payer: Self-pay | Admitting: Physician Assistant

## 2018-05-04 VITALS — BP 110/88 | HR 99 | Temp 98.1°F | Ht 60.75 in | Wt 171.2 lb

## 2018-05-04 DIAGNOSIS — Z124 Encounter for screening for malignant neoplasm of cervix: Secondary | ICD-10-CM | POA: Insufficient documentation

## 2018-05-04 DIAGNOSIS — H6692 Otitis media, unspecified, left ear: Secondary | ICD-10-CM

## 2018-05-04 MED ORDER — AZITHROMYCIN 250 MG PO TABS: ORAL_TABLET | ORAL | 0 refills | Status: AC

## 2018-05-04 NOTE — Progress Notes (Signed)
BP 110/88 (BP Location: Left Arm, Patient Position: Sitting, Cuff Size: Normal)   Pulse 99   Temp 98.1 F (36.7 C)   Ht 5' 0.75" (1.543 m)   Wt 171 lb 4 oz (77.7 kg)   LMP 04/25/2018 (Approximate)   SpO2 99%   BMI 32.62 kg/m    Subjective:    Patient ID: Jacqueline [name], female    DOB: June 28, 1976, 42 y.o.   MRN: [id number]  HPI: Jacqueline [name] is a 42 y.o. female presenting on 05/04/2018 for Gynecologic Exam and Ear Drainage (L ear. pt states smelly drainage came out on Sunday night. pt states she has pain on inside of ear and on L side of face)   HPI   Chief Complaint  Patient presents with  . Gynecologic Exam  . Ear Drainage    L ear. pt states smelly drainage came out on Sunday night. pt states she has pain on inside of ear and on L side of face     Relevant past medical, surgical, family and social history reviewed and updated as indicated. Interim medical history since our last visit reviewed. Allergies and medications reviewed and updated.   Current Outpatient Medications:  .  ibuprofen (ADVIL,MOTRIN) 200 MG tablet, Take 600 mg by mouth every 6 (six) hours as needed., Disp: , Rfl:  .  loratadine (CLARITIN) 10 MG tablet, Take 10 mg by mouth daily as needed for allergies., Disp: , Rfl:  .  Pseudoephedrine HCl (SUDAFED CONGESTION PO), Take 2 tablets by mouth as needed., Disp: , Rfl:  .  ranitidine (ZANTAC) 150 MG tablet, Take 150 mg by mouth 2 (two) times daily., Disp: , Rfl:    Review of Systems  Constitutional: Negative for appetite change, chills, diaphoresis, fatigue, fever and unexpected weight change.  HENT: Positive for congestion, ear pain and hearing loss. Negative for dental problem, drooling, facial swelling, mouth sores, sneezing, sore throat, trouble swallowing and voice change.   Eyes: Negative for pain, discharge, redness, itching and visual disturbance.  Respiratory: Positive for cough. Negative for choking, shortness of breath and wheezing.    Cardiovascular: Negative for chest pain, palpitations and leg swelling.  Gastrointestinal: Negative for abdominal pain, blood in stool, constipation, diarrhea and vomiting.  Endocrine: Negative for cold intolerance, heat intolerance and polydipsia.  Genitourinary: Negative for decreased urine volume, dysuria and hematuria.  Musculoskeletal: Negative for arthralgias, back pain and gait problem.  Skin: Negative for rash.  Allergic/Immunologic: Positive for environmental allergies.  Neurological: Positive for headaches. Negative for seizures, syncope and light-headedness.  Hematological: Negative for adenopathy.  Psychiatric/Behavioral: Negative for agitation, dysphoric mood and suicidal ideas. The patient is not nervous/anxious.     Per HPI unless specifically indicated above     Objective:    BP 110/88 (BP Location: Left Arm, Patient Position: Sitting, Cuff Size: Normal)   Pulse 99   Temp 98.1 F (36.7 C)   Ht 5' 0.75" (1.543 m)   Wt 171 lb 4 oz (77.7 kg)   LMP 04/25/2018 (Approximate)   SpO2 99%   BMI 32.62 kg/m   Wt Readings from Last 3 Encounters:  05/04/18 171 lb 4 oz (77.7 kg)  04/27/18 172 lb 8 oz (78.2 kg)  04/08/18 174 lb 12 oz (79.3 kg)    Physical Exam  Constitutional: She is oriented to person, place, and time. She appears well-developed and well-nourished.  HENT:  Head: Normocephalic and atraumatic.  Right Ear: Hearing, tympanic membrane, external ear and ear canal normal.  Left Ear: Hearing, external ear and ear canal normal. Tympanic membrane is injected.  Nose: Nose normal.  Mouth/Throat: Uvula is midline and oropharynx is clear and moist. No oropharyngeal exudate.  Neck: Neck supple.  Cardiovascular: Normal rate and regular rhythm.  Pulmonary/Chest: Effort normal and breath sounds normal. She has no wheezes. No breast tenderness, discharge or bleeding.  Breast exam normal  Abdominal: Soft. She exhibits no mass. There is no tenderness. There is no rebound  and no guarding.  Genitourinary: Vagina normal and uterus normal. No breast tenderness, discharge or bleeding. There is no rash, tenderness or lesion on the right labia. There is no rash, tenderness or lesion on the left labia. Cervix exhibits no motion tenderness, no discharge and no friability. Right adnexum displays no mass, no tenderness and no fullness. Left adnexum displays no mass, no tenderness and no fullness.  Genitourinary Comments: (nurse Berenice assisted)  Lymphadenopathy:    She has no cervical adenopathy.  Neurological: She is alert and oriented to person, place, and time.  Skin: Skin is warm and dry.  Psychiatric: She has a normal mood and affect. Her behavior is normal.  Nursing note and vitals reviewed.       Assessment & Plan:    Encounter Diagnoses  Name Primary?  . Routine Papanicolaou smear Yes  . Left otitis media, unspecified otitis media type     -rx zpack for the ear.  Continue claritin and sudafed  -pt counseled on BSE and given reading information -Pt to follow up 1 year  RTO sooner prn

## 2018-07-14 ENCOUNTER — Encounter: Payer: Self-pay | Admitting: Physician Assistant

## 2018-07-14 ENCOUNTER — Ambulatory Visit: Payer: Self-pay | Admitting: Physician Assistant

## 2018-07-14 VITALS — BP 121/83 | HR 88 | Temp 98.1°F | Ht 60.75 in | Wt 164.0 lb

## 2018-07-14 DIAGNOSIS — K0889 Other specified disorders of teeth and supporting structures: Secondary | ICD-10-CM

## 2018-07-14 MED ORDER — CLINDAMYCIN HCL 300 MG PO CAPS: 300.0000 mg | ORAL_CAPSULE | Freq: Three times a day (TID) | ORAL | 0 refills | Status: DC

## 2018-07-14 NOTE — Progress Notes (Signed)
BP 121/83 (BP Location: Left Arm, Patient Position: Sitting, Cuff Size: Normal)   Pulse 88   Temp 98.1 F (36.7 C)   Ht 5' 0.75" (1.543 m)   Wt 164 lb (74.4 kg)   SpO2 97%   BMI 31.24 kg/m    Subjective:    Patient ID: SHONTELL PROSSER, female    DOB: 1976-10-09, 42 y.o.   MRN: [id number]  HPI: KADINCE BOXLEY is a 42 y.o. female presenting on 07/14/2018 for Ear Pain (L ear. pt states she went swimming last week. pt states she felt water went in her ear and feels like that has caused her pain. pt has taken psudafed for pain which helps a little)   HPI   Chief Complaint  Patient presents with  . Ear Pain    L ear. pt states she went swimming last week. pt states she felt water went in her ear and feels like that has caused her pain. pt has taken psudafed for pain which helps a little   Pt says she has intermittent teeth pain for quite some time and it has been hurting lately on the Left side and sometimes her face swells.  She says it has been several years since she went to the dentist  Relevant past medical, surgical, family and social history reviewed and updated as indicated. Interim medical history since our last visit reviewed. Allergies and medications reviewed and updated.  Review of Systems  Constitutional: Positive for fever. Negative for appetite change, chills, diaphoresis, fatigue and unexpected weight change.  HENT: Positive for dental problem, ear pain and facial swelling. Negative for congestion, drooling, hearing loss, mouth sores, sneezing, sore throat, trouble swallowing and voice change.   Eyes: Positive for itching. Negative for pain, discharge, redness and visual disturbance.  Respiratory: Negative for cough, choking, shortness of breath and wheezing.   Cardiovascular: Negative for chest pain, palpitations and leg swelling.  Gastrointestinal: Negative for abdominal pain, blood in stool, constipation, diarrhea and vomiting.  Endocrine: Negative for cold intolerance,  heat intolerance and polydipsia.  Genitourinary: Negative for decreased urine volume, dysuria and hematuria.  Musculoskeletal: Negative for arthralgias, back pain and gait problem.  Skin: Negative for rash.  Allergic/Immunologic: Negative for environmental allergies.  Neurological: Positive for headaches. Negative for seizures, syncope and light-headedness.  Hematological: Negative for adenopathy.  Psychiatric/Behavioral: Negative for agitation, dysphoric mood and suicidal ideas. The patient is not nervous/anxious.     Per HPI unless specifically indicated above     Objective:    BP 121/83 (BP Location: Left Arm, Patient Position: Sitting, Cuff Size: Normal)   Pulse 88   Temp 98.1 F (36.7 C)   Ht 5' 0.75" (1.543 m)   Wt 164 lb (74.4 kg)   SpO2 97%   BMI 31.24 kg/m   Wt Readings from Last 3 Encounters:  07/14/18 164 lb (74.4 kg)  05/04/18 171 lb 4 oz (77.7 kg)  04/27/18 172 lb 8 oz (78.2 kg)    Physical Exam  Constitutional: She is oriented to person, place, and time. She appears well-developed and well-nourished.  HENT:  Head: Normocephalic and atraumatic.  Right Ear: Hearing, tympanic membrane, external ear and ear canal normal.  Left Ear: Hearing, tympanic membrane, external ear and ear canal normal.  Nose: Nose normal.  Mouth/Throat: Uvula is midline and oropharynx is clear and moist. No trismus in the jaw. Abnormal dentition. Dental caries present. No dental abscesses or uvula swelling. No oropharyngeal exudate.  Neck: Neck supple.  Cardiovascular: Normal rate and regular rhythm.  Pulmonary/Chest: Effort normal and breath sounds normal. She has no wheezes.  Lymphadenopathy:    She has no cervical adenopathy.  Neurological: She is alert and oriented to person, place, and time.  Skin: Skin is warm and dry.  Psychiatric: She has a normal mood and affect. Her behavior is normal.  Vitals reviewed.       Assessment & Plan:    Encounter Diagnosis  Name Primary?  Norva Riffle.  Dentalgia Yes    -discussed with pt that ear looks normal.  Suspect dental infection.  rx clindamycin.  Will put pt on dental list -pt to follow up as scheduled.  RTO sooner prn

## 2019-01-04 ENCOUNTER — Encounter: Payer: Self-pay | Admitting: Physician Assistant

## 2019-01-04 ENCOUNTER — Ambulatory Visit: Payer: Self-pay | Admitting: Physician Assistant

## 2019-01-04 VITALS — BP 124/86 | HR 86 | Temp 97.7°F | Ht 60.75 in | Wt 162.5 lb

## 2019-01-04 DIAGNOSIS — K0889 Other specified disorders of teeth and supporting structures: Secondary | ICD-10-CM

## 2019-01-04 MED ORDER — CLINDAMYCIN HCL 300 MG PO CAPS: 300.0000 mg | ORAL_CAPSULE | Freq: Three times a day (TID) | ORAL | 0 refills | Status: DC

## 2019-01-04 NOTE — Progress Notes (Signed)
BP 124/86 (BP Location: Left Arm, Patient Position: Sitting, Cuff Size: Normal)   Pulse 86   Temp 97.7 F (36.5 C) (Other (Comment))   Ht 5' 0.75" (1.543 m)   Wt 162 lb 8 oz (73.7 kg)   SpO2 98%   BMI 30.96 kg/m    Subjective:    Patient ID: Jacqueline [name], female    DOB: [birth year], 43 y.o.   MRN: [id number]  HPI: Jacqueline OrganMisty D Rothlisberger is a 43 y.o. female presenting on 01/04/2019 for Sinusitis (c/o left side of facial swelling and pain; has appt with dentist 02-20-19 for deep cleaning) and Ear Pain (c/o left ear pain)   HPI   Chief Complaint  Patient presents with  . Sinusitis    c/o left side of facial swelling and pain; has appt with dentist 02-20-19 for deep cleaning  . Ear Pain    c/o left ear pain     Pt says dentist will fix her painful tooth at February appointment as well.   Relevant past medical, surgical, family and social history reviewed and updated as indicated. Interim medical history since our last visit reviewed. Allergies and medications reviewed and updated.   Current Outpatient Medications:  .  ibuprofen (ADVIL,MOTRIN) 200 MG tablet, Take 600 mg by mouth every 6 (six) hours as needed., Disp: , Rfl:  .  loratadine (CLARITIN) 10 MG tablet, Take 10 mg by mouth daily as needed for allergies., Disp: , Rfl:  .  Pseudoephedrine HCl (SUDAFED CONGESTION PO), Take 2 tablets by mouth as needed., Disp: , Rfl:     Review of Systems  Constitutional: Positive for appetite change, diaphoresis, fatigue and unexpected weight change. Negative for chills and fever.  HENT: Positive for congestion, dental problem, drooling, ear pain and facial swelling. Negative for hearing loss, mouth sores, sneezing, sore throat, trouble swallowing and voice change.   Eyes: Positive for pain. Negative for discharge, redness, itching and visual disturbance.  Respiratory: Negative for cough, choking, shortness of breath and wheezing.   Cardiovascular: Negative for chest pain, palpitations and leg  swelling.  Gastrointestinal: Negative for abdominal pain, blood in stool, constipation, diarrhea and vomiting.  Endocrine: Negative for cold intolerance, heat intolerance and polydipsia.  Genitourinary: Negative for decreased urine volume, dysuria and hematuria.  Musculoskeletal: Negative for arthralgias, back pain and gait problem.  Skin: Negative for rash.  Allergic/Immunologic: Negative for environmental allergies.  Neurological: Positive for headaches. Negative for seizures, syncope and light-headedness.  Hematological: Positive for adenopathy.  Psychiatric/Behavioral: Negative for agitation, dysphoric mood and suicidal ideas. The patient is not nervous/anxious.     Per HPI unless specifically indicated above     Objective:    BP 124/86 (BP Location: Left Arm, Patient Position: Sitting, Cuff Size: Normal)   Pulse 86   Temp 97.7 F (36.5 C) (Other (Comment))   Ht 5' 0.75" (1.543 m)   Wt 162 lb 8 oz (73.7 kg)   SpO2 98%   BMI 30.96 kg/m   Wt Readings from Last 3 Encounters:  01/04/19 162 lb 8 oz (73.7 kg)  07/14/18 164 lb (74.4 kg)  05/04/18 171 lb 4 oz (77.7 kg)    Physical Exam Vitals signs and nursing note reviewed.  Constitutional:      Appearance: Normal appearance.  HENT:     Head: Normocephalic and atraumatic.     Mouth/Throat:     Mouth: Mucous membranes are moist.     Dentition: Abnormal dentition. Dental tenderness and dental caries present. No dental  abscesses.     Pharynx: Oropharynx is clear. Uvula midline. No uvula swelling.  Pulmonary:     Effort: Pulmonary effort is normal. No respiratory distress.  Skin:    General: Skin is warm and dry.  Neurological:     General: No focal deficit present.     Mental Status: She is alert and oriented to person, place, and time.  Psychiatric:        Mood and Affect: Mood normal.        Behavior: Behavior normal.          Assessment & Plan:    Encounter Diagnosis  Name Primary?  Norva Riffle Yes    -rx  clindamycin.  Pt to see dentist as scheduled -pt to follow up here as scheduled

## 2019-03-16 ENCOUNTER — Telehealth: Payer: Self-pay | Admitting: Physician Assistant

## 2019-05-05 ENCOUNTER — Ambulatory Visit: Payer: Self-pay | Admitting: Physician Assistant

## 2019-06-27 ENCOUNTER — Ambulatory Visit: Payer: Self-pay | Admitting: Physician Assistant

## 2019-06-27 ENCOUNTER — Encounter: Payer: Self-pay | Admitting: Physician Assistant

## 2019-06-27 DIAGNOSIS — W57XXXA Bitten or stung by nonvenomous insect and other nonvenomous arthropods, initial encounter: Secondary | ICD-10-CM

## 2019-06-27 DIAGNOSIS — R21 Rash and other nonspecific skin eruption: Secondary | ICD-10-CM

## 2019-06-27 DIAGNOSIS — Z0000 Encounter for general adult medical examination without abnormal findings: Secondary | ICD-10-CM

## 2019-06-27 DIAGNOSIS — Z803 Family history of malignant neoplasm of breast: Secondary | ICD-10-CM

## 2019-06-27 DIAGNOSIS — Z1239 Encounter for other screening for malignant neoplasm of breast: Secondary | ICD-10-CM

## 2019-06-27 MED ORDER — TRIAMCINOLONE ACETONIDE 0.1 % EX CREA: 1.0000 "application " | TOPICAL_CREAM | Freq: Two times a day (BID) | CUTANEOUS | 0 refills | Status: DC | PRN

## 2019-06-27 NOTE — Progress Notes (Signed)
There were no vitals taken for this visit.   Subjective:    Patient ID: Jacqueline [name], female    DOB: 06-21-1976, 43 y.o.   MRN: [id number]  HPI: Jacqueline [name] is a 43 y.o. female presenting on 06/27/2019 for No chief complaint on file.   HPI   This is a telemedicine appointment through [employer] due to coronavirus pandemic.    I connected with  Emberly D Moya on 06/27/19 by a video enabled telemedicine application and verified that I am speaking with the correct person using two identifiers.   I discussed the limitations of evaluation and management by telemedicine. The patient expressed understanding and agreed to proceed.   Pt is at home.  Provider is at office.   Pt started with "rash" 3 or 4 weeks ago.  She describes it as Bumps on her Thighs, forearms, stomach, bottoms and sides of feet.  Back, and 1 on L breast.  She says it has Spared upper arms , lower legs, face.  She says they don't itch.  She says that it appears that the bumps are resolving and new ones are coming up.    She has put benedryl and dortisone cream on them but hasn't noticed much difference.   Pt says she is doing well otherwise.    She is mostly staying home and she wears a mask when she has to go out.   Paternal GM and aunt both had breast cancer  Relevant past medical, surgical, family and social history reviewed and updated as indicated. Interim medical history since our last visit reviewed. Allergies and medications reviewed and updated.   Current Outpatient Medications:  .  cetirizine (ZYRTEC) 10 MG tablet, Take 10 mg by mouth daily., Disp: , Rfl:  .  ibuprofen (ADVIL,MOTRIN) 200 MG tablet, Take 600 mg by mouth every 6 (six) hours as needed., Disp: , Rfl:     Review of Systems  Per HPI unless specifically indicated above     Objective:    There were no vitals taken for this visit.  Wt Readings from Last 3 Encounters:  01/04/19 162 lb 8 oz (73.7 kg)  07/14/18 164 lb (74.4 kg)  05/04/18 171  lb 4 oz (77.7 kg)    Physical Exam Constitutional:      General: She is not in acute distress.    Appearance: Normal appearance. She is not ill-appearing.  HENT:     Head: Normocephalic and atraumatic.  Pulmonary:     Effort: Pulmonary effort is normal. No respiratory distress.  Skin:    Findings: Rash present.     Comments: Multiple papules bilateral forearms- some scattered on other body parts.  Flexure areas appear spared.   No signs secondary infection  Neurological:     Mental Status: She is alert and oriented to person, place, and time.  Psychiatric:        Attention and Perception: Attention normal.        Mood and Affect: Mood normal.        Speech: Speech normal.        Behavior: Behavior normal. Behavior is cooperative.        Cognition and Memory: Cognition normal.           Assessment & Plan:    Encounter Diagnoses  Name Primary?  . Screening for breast cancer Yes  . Family history of breast cancer   . Rash   . Insect bite, unspecified site, initial encounter   .  Well adult health check     -due to family history of breast cancer, will start Screening mammograms -rx TAC to use on rash.  Discussed with pt they appear to be insect bites.  She says she has examined her bedding without seeing any bedbugs.  Further discussion reveals that pt likes to sit outside in the evening to read and she does admit to being bitten.  Recommended she use insect repellant when she is going to do this.  She is to contact office for worsening or new symptoms -no new labs needed at this time -pt to follow up 1 year.  She is to contact office sooner prn

## 2019-06-29 ENCOUNTER — Other Ambulatory Visit: Payer: Self-pay | Admitting: Physician Assistant

## 2019-06-29 DIAGNOSIS — Z1239 Encounter for other screening for malignant neoplasm of breast: Secondary | ICD-10-CM

## 2019-07-06 ENCOUNTER — Ambulatory Visit: Payer: Self-pay | Admitting: Physician Assistant

## 2019-07-13 ENCOUNTER — Ambulatory Visit (HOSPITAL_COMMUNITY)
Admission: RE | Admit: 2019-07-13 | Discharge: 2019-07-13 | Disposition: A | Discharge status: Home / Self Care | Payer: Self-pay | Source: Ambulatory Visit | Attending: Physician Assistant | Admitting: Physician Assistant

## 2019-07-13 ENCOUNTER — Other Ambulatory Visit: Payer: Self-pay

## 2019-07-13 ENCOUNTER — Encounter (HOSPITAL_COMMUNITY): Payer: Self-pay

## 2019-07-13 DIAGNOSIS — Z1239 Encounter for other screening for malignant neoplasm of breast: Secondary | ICD-10-CM | POA: Insufficient documentation

## 2019-07-14 ENCOUNTER — Other Ambulatory Visit (HOSPITAL_COMMUNITY): Payer: Self-pay | Admitting: Physician Assistant

## 2019-07-14 DIAGNOSIS — R928 Other abnormal and inconclusive findings on diagnostic imaging of breast: Secondary | ICD-10-CM

## 2019-07-19 ENCOUNTER — Ambulatory Visit (HOSPITAL_COMMUNITY)
Admission: RE | Admit: 2019-07-19 | Discharge: 2019-07-19 | Disposition: A | Discharge status: Home / Self Care | Payer: Self-pay | Source: Ambulatory Visit | Attending: Physician Assistant | Admitting: Physician Assistant

## 2019-07-19 ENCOUNTER — Other Ambulatory Visit: Payer: Self-pay

## 2019-07-19 DIAGNOSIS — R928 Other abnormal and inconclusive findings on diagnostic imaging of breast: Secondary | ICD-10-CM

## 2019-09-01 ENCOUNTER — Ambulatory Visit: Payer: Self-pay | Admitting: Physician Assistant

## 2019-09-01 ENCOUNTER — Encounter: Payer: Self-pay | Admitting: Physician Assistant

## 2019-09-01 DIAGNOSIS — E785 Hyperlipidemia, unspecified: Secondary | ICD-10-CM

## 2019-09-01 DIAGNOSIS — R609 Edema, unspecified: Secondary | ICD-10-CM

## 2019-09-01 NOTE — Progress Notes (Signed)
There were no vitals taken for this visit.   Subjective:    Patient ID: Jacqueline [name], female    DOB: 1976/02/19, 43 y.o.   MRN: [id number]  HPI: SINIYAH [name] is a 43 y.o. female presenting on 09/01/2019 for No chief complaint on file.   HPI   This is a telemedicine appointment through [employer] due to coronavirus pandemic.    I connected with  Seleena D Granholm on 09/01/19 by a video enabled telemedicine application and verified that I am speaking with the correct person using two identifiers.   I discussed the limitations of evaluation and management by telemedicine. The patient expressed understanding and agreed to proceed.  Pt is at home.  Provider is at office.      Pt complains that for the last Several weeks she has had L foot swelling.  Several days ago it looked bruised.  Today it feels better.  She denies injury to the foot.   She is not working.  She walks for about an hour each evening for exercise.  She wears flip flops for her evening walks.    She says she is doing well otherwise.  She is having no sob, cp, HA, fevers, cough.  Pt quit smoking 9 years ago.     Relevant past medical, surgical, family and social history reviewed and updated as indicated. Interim medical history since our last visit reviewed. Allergies and medications reviewed and updated.   Current Outpatient Medications:  .  acetaminophen (TYLENOL) 500 MG tablet, Take 500 mg by mouth every 6 (six) hours as needed., Disp: , Rfl:  .  cetirizine (ZYRTEC) 10 MG tablet, Take 10 mg by mouth daily., Disp: , Rfl:  .  ibuprofen (ADVIL,MOTRIN) 200 MG tablet, Take 600 mg by mouth every 6 (six) hours as needed., Disp: , Rfl:     Review of Systems  Per HPI unless specifically indicated above     Objective:    There were no vitals taken for this visit.  Wt Readings from Last 3 Encounters:  01/04/19 162 lb 8 oz (73.7 kg)  07/14/18 164 lb (74.4 kg)  05/04/18 171 lb 4 oz (77.7 kg)    Physical  Exam Constitutional:      General: She is not in acute distress.    Appearance: Normal appearance. She is not ill-appearing.  HENT:     Head: Normocephalic and atraumatic.  Pulmonary:     Effort: Pulmonary effort is normal. No respiratory distress.  Musculoskeletal:     Left ankle: She exhibits no swelling.     Right foot: Normal range of motion. No swelling.     Left foot: Normal range of motion. Swelling present.     Comments: Mild swelling left foot.  No bruising or discoloration.  No open wounds.   Neurological:     Mental Status: She is alert and oriented to person, place, and time.  Psychiatric:        Attention and Perception: Attention normal.        Mood and Affect: Mood normal.        Speech: Speech normal.        Behavior: Behavior is cooperative.          Assessment & Plan:    Encounter Diagnoses  Name Primary?  . Edema, unspecified type Yes  . Hyperlipidemia, unspecified hyperlipidemia type      -will check Labs -discussed with pt that her foot is likely swelled in protest to  long walks in flip flops.  She is to avoid wearing flip flops when walking.  She is encouraged to wear good fitting, supportive sneakers and socks.   -she is encouraged to Elevate feet when seated -will follow up for evisit in 2 week for recheck.  She is to Call office sooner for worsening or new sypmtoms

## 2019-09-19 ENCOUNTER — Other Ambulatory Visit (HOSPITAL_COMMUNITY)
Admission: RE | Admit: 2019-09-19 | Discharge: 2019-09-19 | Disposition: A | Discharge status: Home / Self Care | Payer: Medicaid Other | Source: Ambulatory Visit | Attending: Physician Assistant | Admitting: Physician Assistant

## 2019-09-19 DIAGNOSIS — R69 Illness, unspecified: Secondary | ICD-10-CM | POA: Diagnosis present

## 2019-09-19 DIAGNOSIS — R609 Edema, unspecified: Secondary | ICD-10-CM | POA: Insufficient documentation

## 2019-09-19 DIAGNOSIS — E785 Hyperlipidemia, unspecified: Secondary | ICD-10-CM | POA: Diagnosis present

## 2019-09-19 LAB — COMPREHENSIVE METABOLIC PANEL
ALT: 13 U/L (ref 0–44)
AST: 17 U/L (ref 15–41)
Albumin: 4.2 g/dL (ref 3.5–5.0)
Alkaline Phosphatase: 56 U/L (ref 38–126)
Anion gap: 6 (ref 5–15)
BUN: 12 mg/dL (ref 6–20)
CO2: 28 mmol/L (ref 22–32)
Calcium: 9.2 mg/dL (ref 8.9–10.3)
Chloride: 105 mmol/L (ref 98–111)
Creatinine, Ser: 0.57 mg/dL (ref 0.44–1.00)
GFR calc Af Amer: >60 mL/min (ref >60)
GFR calc non Af Amer: >60 mL/min (ref >60)
Glucose, Bld: 92 mg/dL (ref 70–99)
Potassium: 4.2 mmol/L (ref 3.5–5.1)
Sodium: 139 mmol/L (ref 135–145)
Total Bilirubin: 0.6 mg/dL (ref 0.3–1.2)
Total Protein: 7.5 g/dL (ref 6.5–8.1)

## 2019-09-19 LAB — LIPID PANEL
Cholesterol: 210 mg/dL — ABNORMAL HIGH (ref 0–200)
HDL: 42 mg/dL (ref >40)
LDL Cholesterol: 143 mg/dL — ABNORMAL HIGH (ref 0–99)
Total CHOL/HDL Ratio: 5 RATIO
Triglycerides: 125 mg/dL (ref <150)
VLDL: 25 mg/dL (ref 0–40)

## 2019-09-19 LAB — CBC
HCT: 43.4 % (ref 36.0–46.0)
Hemoglobin: 14.2 g/dL (ref 12.0–15.0)
MCH: 28.8 pg (ref 26.0–34.0)
MCHC: 32.7 g/dL (ref 30.0–36.0)
MCV: 88 fL (ref 80.0–100.0)
Platelets: 383 10*3/uL (ref 150–400)
RBC: 4.93 MIL/uL (ref 3.87–5.11)
RDW: 13.9 % (ref 11.5–15.5)
WBC: 9.2 10*3/uL (ref 4.0–10.5)
nRBC: 0 % (ref 0.0–0.2)

## 2019-09-22 ENCOUNTER — Ambulatory Visit: Payer: Self-pay | Admitting: Physician Assistant

## 2019-09-22 ENCOUNTER — Encounter: Payer: Self-pay | Admitting: Physician Assistant

## 2019-09-22 DIAGNOSIS — R928 Other abnormal and inconclusive findings on diagnostic imaging of breast: Secondary | ICD-10-CM

## 2019-09-22 DIAGNOSIS — E785 Hyperlipidemia, unspecified: Secondary | ICD-10-CM

## 2019-09-22 NOTE — Progress Notes (Signed)
There were no vitals taken for this visit.   Subjective:    Patient ID: Jacqueline [name], female    DOB: 06-02-1976, 43 y.o.   MRN: [id number]  HPI: Jacqueline [name] is a 43 y.o. female presenting on 09/22/2019 for No chief complaint on file.   HPI   This is a telemedicine appointment through [employer] due to coronavirus pandemic  I connected with  Jacqueline [name] on 09/22/19 by a video enabled telemedicine application and verified that I am speaking with the correct person using two identifiers.   I discussed the limitations of evaluation and management by telemedicine. The patient expressed understanding and agreed to proceed.  Pt is at home.  Provider is at office   Pt says her swelling is much improved and she only has a little bit on the right on the top.  She wears socks and sneakers to exercise now.   She says she is feeling well and has no complaints    Relevant past medical, surgical, family and social history reviewed and updated as indicated. Interim medical history since our last visit reviewed. Allergies and medications reviewed and updated.   Current Outpatient Medications:  .  acetaminophen (TYLENOL) 500 MG tablet, Take 500 mg by mouth every 6 (six) hours as needed., Disp: , Rfl:  .  cetirizine (ZYRTEC) 10 MG tablet, Take 10 mg by mouth daily., Disp: , Rfl:  .  ibuprofen (ADVIL,MOTRIN) 200 MG tablet, Take 600 mg by mouth every 6 (six) hours as needed., Disp: , Rfl:     Review of Systems  Per HPI unless specifically indicated above     Objective:    There were no vitals taken for this visit.  Wt Readings from Last 3 Encounters:  01/04/19 162 lb 8 oz (73.7 kg)  07/14/18 164 lb (74.4 kg)  05/04/18 171 lb 4 oz (77.7 kg)    Physical Exam Constitutional:      General: She is not in acute distress.    Appearance: Normal appearance. She is not ill-appearing.  HENT:     Head: Normocephalic and atraumatic.  Pulmonary:     Effort: Pulmonary effort is normal. No  respiratory distress.  Musculoskeletal:     Right lower leg: No edema.     Left lower leg: No edema.     Right foot: No swelling.     Left foot: No swelling.  Neurological:     Mental Status: She is alert and oriented to person, place, and time.  Psychiatric:        Attention and Perception: Attention normal.        Mood and Affect: Mood normal.        Speech: Speech normal.        Behavior: Behavior is cooperative.     Results for orders placed or performed during the hospital encounter of 09/19/19  CBC  Result Value Ref Range   WBC 9.2 4.0 - 10.5 K/uL   RBC 4.93 3.87 - 5.11 MIL/uL   Hemoglobin 14.2 12.0 - 15.0 g/dL   HCT 43.4 36.0 - 46.0 %   MCV 88.0 80.0 - 100.0 fL   MCH 28.8 26.0 - 34.0 pg   MCHC 32.7 30.0 - 36.0 g/dL   RDW 13.9 11.5 - 15.5 %   Platelets 383 150 - 400 K/uL   nRBC 0.0 0.0 - 0.2 %  Lipid panel  Result Value Ref Range   Cholesterol 210 (H) 0 - 200 mg/dL  Triglycerides 125 <150 mg/dL   HDL 42 >88 mg/dL   Total CHOL/HDL Ratio 5.0 RATIO   VLDL 25 0 - 40 mg/dL   LDL Cholesterol 416 (H) 0 - 99 mg/dL  Comprehensive metabolic panel  Result Value Ref Range   Sodium 139 135 - 145 mmol/L   Potassium 4.2 3.5 - 5.1 mmol/L   Chloride 105 98 - 111 mmol/L   CO2 28 22 - 32 mmol/L   Glucose, Bld 92 70 - 99 mg/dL   BUN 12 6 - 20 mg/dL   Creatinine, Ser 6.06 0.44 - 1.00 mg/dL   Calcium 9.2 8.9 - 30.1 mg/dL   Total Protein 7.5 6.5 - 8.1 g/dL   Albumin 4.2 3.5 - 5.0 g/dL   AST 17 15 - 41 U/L   ALT 13 0 - 44 U/L   Alkaline Phosphatase 56 38 - 126 U/L   Total Bilirubin 0.6 0.3 - 1.2 mg/dL   GFR calc non Af Amer >60 >60 mL/min   GFR calc Af Amer >60 >60 mL/min   Anion gap 6 5 - 15      Assessment & Plan:   Encounter Diagnoses  Name Primary?  . Hyperlipidemia, unspecified hyperlipidemia type Yes  . Abnormal mammogram      -reviewed labs with pt -Counseled on lowfat diet and regular exercise to help dyslipidemia -pt due for follow up mammogram in  January -pt to follow up as scheduled.  She is to contact office sooner prn

## 2019-12-28 ENCOUNTER — Ambulatory Visit (INDEPENDENT_AMBULATORY_CARE_PROVIDER_SITE_OTHER): Payer: Medicaid Other | Admitting: Family Medicine

## 2019-12-28 ENCOUNTER — Encounter: Payer: Self-pay | Admitting: Family Medicine

## 2019-12-28 ENCOUNTER — Other Ambulatory Visit: Payer: Self-pay

## 2019-12-28 VITALS — BP 120/80 | HR 91 | Temp 98.4°F | Resp 15 | Ht 60.0 in | Wt 161.0 lb

## 2019-12-28 DIAGNOSIS — R030 Elevated blood-pressure reading, without diagnosis of hypertension: Secondary | ICD-10-CM

## 2019-12-28 DIAGNOSIS — N6320 Unspecified lump in the left breast, unspecified quadrant: Secondary | ICD-10-CM | POA: Diagnosis not present

## 2019-12-28 DIAGNOSIS — E669 Obesity, unspecified: Secondary | ICD-10-CM

## 2019-12-28 DIAGNOSIS — Z23 Encounter for immunization: Secondary | ICD-10-CM | POA: Diagnosis not present

## 2019-12-28 DIAGNOSIS — Z720 Tobacco use: Secondary | ICD-10-CM

## 2019-12-28 DIAGNOSIS — F419 Anxiety disorder, unspecified: Secondary | ICD-10-CM

## 2019-12-28 DIAGNOSIS — F321 Major depressive disorder, single episode, moderate: Secondary | ICD-10-CM | POA: Diagnosis not present

## 2019-12-28 MED ORDER — ESCITALOPRAM OXALATE 10 MG PO TABS: 10.0000 mg | ORAL_TABLET | Freq: Every day | ORAL | 1 refills | Status: DC

## 2019-12-28 MED ORDER — BUSPIRONE HCL 5 MG PO TABS: 5.0000 mg | ORAL_TABLET | Freq: Three times a day (TID) | ORAL | 1 refills | Status: DC

## 2019-12-28 NOTE — Progress Notes (Signed)
Subjective:  Patient ID: Jacqueline [name], female    DOB: 01/16/1976  Age: 43 y.o. MRN: [id number]  CC:  Chief Complaint  Patient presents with  . New Patient (Initial Visit)    establish care, time for her mammogram, wants to discuss panic attacks and insomnia      HPI  HPI  Ms. Jacqueline [name] is a 43 year old female patient who presents today to establish care.  She needs to get a ultrasound ordered for left breast mass that was found on her mammogram earlier this year.  [place] being [place] at the [place] [facility] but now has [employer] insurance.  Reports that she has had multiple panic attacks and has some insomnia that is going on.  Secondary to recent loss of her fianc back at the end of October.  Reports that they had 41-year-old son and they live in his home that was still under his and his first wife's name.  So she lost her home after his passing.  And has had to find housing for her and her son.  She was not smoking and started smoking once he passed away.  Reports she is not eating a very good diet reduced appetite secondary to having panic attacks and anxiety and depression.  Is only able to sleep 2 to 3 hours at a time at night.  Has not had anxiety or depression in the past.  Does have some allergies which she uses Zyrtec as needed.  Would like to get her flu shot today.  Is up-to-date on all healthcare screenings and care gaps.  Outside of HIV screening which she probably had when she was pregnant.  And tetanus which she probably having is pregnant and her son is okay.  Overall she reports that she is struggling right now and really needs help is open to therapy would like to get some medicine to see if that can help in the short-term.  Is trying to get back into school in the upcoming year.  Today patient denies signs and symptoms of COVID 19 infection including fever, chills, cough, shortness of breath, and headache. Past Medical, Surgical, Social History, Allergies, and  Medications have been Reviewed.   Past Medical History:  Diagnosis Date  . GERD (gastroesophageal reflux disease)   . Kidney stones    hx UTI  . Sinusitis   . URI (upper respiratory infection)   . UTI (lower urinary tract infection)     Current Meds  Medication Sig  . acetaminophen (TYLENOL) 500 MG tablet Take 500 mg by mouth every 6 (six) hours as needed.  . cetirizine (ZYRTEC) 10 MG tablet Take 10 mg by mouth daily.  . pseudoephedrine (SUDAFED) 120 MG 12 hr tablet Take 120 mg by mouth every 12 (twelve) hours as needed for congestion.  . [DISCONTINUED] ibuprofen (ADVIL,MOTRIN) 200 MG tablet Take 600 mg by mouth every 6 (six) hours as needed.    ROS:  Review of Systems  Constitutional: Negative.   HENT: Negative.   Eyes: Negative.   Respiratory: Negative.   Cardiovascular: Negative.   Gastrointestinal: Negative.   Genitourinary: Negative.   Musculoskeletal: Negative.   Skin: Negative.   Neurological: Negative.   Endo/Heme/Allergies: Negative.   Psychiatric/Behavioral: Positive for depression. The patient is nervous/anxious.      Objective:   Today's Vitals: BP 120/80   Pulse 91   Temp 98.4 F (36.9 C) (Oral)   Resp 15   Ht 5' (1.524 m)   Wt 161  lb (73 kg)   SpO2 99%   BMI 31.44 kg/m  Vitals with BMI 12/28/2019 01/04/2019 07/14/2018  Height 5\' 0"  5' 0.75" 5' 0.75"  Weight 161 lbs 162 lbs 8 oz 164 lbs  BMI 31.44 74.25 95.63  Systolic 875 643 329  Diastolic 80 86 83  Pulse 91 86 88     Physical Exam Vitals and nursing note reviewed.  Constitutional:      Appearance: Normal appearance. She is well-developed, well-groomed and overweight.  HENT:     Head: Normocephalic and atraumatic.     Right Ear: External ear normal.     Left Ear: External ear normal.     Nose: Nose normal.     Mouth/Throat:     Mouth: Mucous membranes are moist.     Pharynx: Oropharynx is clear.  Eyes:     General:        Right eye: No discharge.        Left eye: No discharge.      Conjunctiva/sclera: Conjunctivae normal.  Cardiovascular:     Rate and Rhythm: Normal rate and regular rhythm.     Pulses: Normal pulses.     Heart sounds: Normal heart sounds.  Pulmonary:     Effort: Pulmonary effort is normal.     Breath sounds: Normal breath sounds.  Musculoskeletal:        General: Normal range of motion.     Cervical back: Normal range of motion and neck supple.  Skin:    General: Skin is warm.  Neurological:     General: No focal deficit present.     Mental Status: She is alert and oriented to person, place, and time.  Psychiatric:        Attention and Perception: Attention normal.        Mood and Affect: Mood is anxious. Affect is tearful.        Speech: Speech normal.        Behavior: Behavior normal. Behavior is cooperative.        Thought Content: Thought content normal.        Cognition and Memory: Cognition normal.        Judgment: Judgment normal.    Depression screen PHQ 2/9 12/28/2019  Decreased Interest 0  Down, Depressed, Hopeless 1  PHQ - 2 Score 1  Altered sleeping 2  Tired, decreased energy 1  Change in appetite 2  Feeling bad or failure about yourself  0  Trouble concentrating 1  Moving slowly or fidgety/restless 2  Suicidal thoughts 0  PHQ-9 Score 9  Difficult doing work/chores Very difficult   GAD 7 : Generalized Anxiety Score 12/28/2019  Nervous, Anxious, on Edge 1  Control/stop worrying 2  Worry too much - different things 2  Trouble relaxing 3  Restless 1  Easily annoyed or irritable 2  Afraid - awful might happen 2  Total GAD 7 Score 13  Anxiety Difficulty Extremely difficult    Assessment   1. Depression, major, single episode, moderate ([place])   2. Anxiety   3. Left breast mass   4. Need for immunization against influenza   5. Obesity (BMI 30-39.9)   6. Prehypertension   7. Nicotine abuse     Tests ordered Orders Placed This Encounter  Procedures  . US BREAST LTD UNI LEFT INC AXILLA  . Flu Vaccine QUAD 36+  mos IM     Plan: Please see assessment and plan per problem list above.   Meds ordered  this encounter  Medications  . escitalopram (LEXAPRO) 10 MG tablet    Sig: Take 1 tablet (10 mg total) by mouth daily.    Dispense:  30 tablet    Refill:  1    Order Specific Question:   Supervising Provider    Answer:   SIMPSON, MARGARET E [[id number]]  . busPIRone (BUSPAR) 5 MG tablet    Sig: Take 1 tablet (5 mg total) by mouth 3 (three) times daily.    Dispense:  90 tablet    Refill:  1    Order Specific Question:   Supervising Provider    Answer:   Kerri Perches [[id number]]    Patient to follow-up in 3.5 weeks .  Freddy Finner, NP

## 2019-12-28 NOTE — Patient Instructions (Signed)
I appreciate the opportunity to provide you with care for your health and wellness. Today we discussed: overall mood and need for [place]    Follow up: 3.5 weeks   Breast Ultrasound ordered today  Start taking medications as directed, if you have questions call or use Mychart.  I hope you start to feel better soon. Work to stop smoking one less cigarette daily. YOU CAN DO THIS  Be patient with yourself. Give yourself and son grace during this time. Normalize the emotions you are having, as these are normal to experience after a loss.  Please continue to practice social distancing to keep you, your family, and our community safe.  If you must go out, please wear a mask and practice good handwashing.  It was a pleasure to see you and I look forward to continuing to work together on your health and well-being. Please do not hesitate to call the office if you need care or have questions about your care.  Have a wonderful day and week. With Gratitude, Cherly Beach, DNP, AGNP-BC

## 2019-12-29 DIAGNOSIS — F410 Panic disorder [episodic paroxysmal anxiety] without agoraphobia: Secondary | ICD-10-CM | POA: Insufficient documentation

## 2019-12-29 DIAGNOSIS — F321 Major depressive disorder, single episode, moderate: Secondary | ICD-10-CM

## 2019-12-29 DIAGNOSIS — Z720 Tobacco use: Secondary | ICD-10-CM | POA: Insufficient documentation

## 2019-12-29 DIAGNOSIS — Z23 Encounter for immunization: Secondary | ICD-10-CM

## 2019-12-29 DIAGNOSIS — N6320 Unspecified lump in the left breast, unspecified quadrant: Secondary | ICD-10-CM | POA: Insufficient documentation

## 2019-12-29 DIAGNOSIS — E669 Obesity, unspecified: Secondary | ICD-10-CM | POA: Insufficient documentation

## 2019-12-29 DIAGNOSIS — R030 Elevated blood-pressure reading, without diagnosis of hypertension: Secondary | ICD-10-CM | POA: Insufficient documentation

## 2019-12-29 DIAGNOSIS — F419 Anxiety disorder, unspecified: Secondary | ICD-10-CM | POA: Insufficient documentation

## 2019-12-29 HISTORY — DX: Major depressive disorder, single episode, moderate: F32.1

## 2019-12-29 HISTORY — DX: Encounter for immunization: Z23

## 2019-12-29 NOTE — Assessment & Plan Note (Signed)
Asked about quitting: confirms they are currently smokes cigarettes Advise to quit smoking: Educated about QUITTING to reduce the risk of cancer, cardio and cerebrovascular disease. Assess willingness: Unwilling to quit at this time, but is working on cutting back. Assist with counseling and pharmacotherapy: Counseled for 5 minutes and literature provided. Arrange for follow up:  not quitting follow up in 3 months and continue to offer help.   

## 2019-12-29 NOTE — Assessment & Plan Note (Signed)

## 2019-12-29 NOTE — Assessment & Plan Note (Signed)
Elevated GAD and PHQ. Extremely tearful. Recent death of her significant other. Other family stress and housing stress. Willing to start Lexapro and Buspar Close follow up in 3.5 weeks. No SI or HI in office

## 2019-12-29 NOTE — Assessment & Plan Note (Signed)
Needs f/u US per recent mammogram in July. Ordered today

## 2019-12-29 NOTE — Assessment & Plan Note (Signed)
Advised to maintain a DASH diet heart healthy diet and exercise 30-60 minutes at least 5 days a week.

## 2019-12-29 NOTE — Assessment & Plan Note (Signed)
Elevated GAD and PHQ. Extremely tearful. Recent death of her significant other. Other family stress and housing stress. Willing to start Lexapro and Buspar Close follow up in 3.5 weeks. No SI or HI in office 

## 2019-12-29 NOTE — Assessment & Plan Note (Signed)
She is educated about the importance of exercise daily to help with weight management. A minumum of 30 minutes daily is recommended. Additionally, importance of healthy food choices  with portion control discussed.  Wt Readings from Last 3 Encounters:  12/28/19 161 lb (73 kg)  01/04/19 162 lb 8 oz (73.7 kg)  07/14/18 164 lb (74.4 kg)

## 2020-01-09 ENCOUNTER — Telehealth: Payer: Self-pay | Admitting: *Deleted

## 2020-01-09 NOTE — Telephone Encounter (Signed)
Called precert center and left new authorization number for ultrasound tomorrow for patient

## 2020-01-09 NOTE — Telephone Encounter (Signed)
Called and left message with Authorization number for ultrasound.

## 2020-01-09 NOTE — Telephone Encounter (Signed)
Jacqueline [name] with [facility] Pre service center called. Pt is scheduled for breast ultrasound on 01-10-20 and her insurance requires pre authorization and the pt does not have one yet. Wondering if this could be taken care of before the appt. She can be reached at [phone] ext [phone]

## 2020-01-10 ENCOUNTER — Ambulatory Visit (HOSPITAL_COMMUNITY)
Admission: RE | Admit: 2020-01-10 | Discharge: 2020-01-10 | Disposition: A | Discharge status: Home / Self Care | Payer: Medicaid Other | Source: Ambulatory Visit | Attending: Family Medicine | Admitting: Family Medicine

## 2020-01-10 ENCOUNTER — Other Ambulatory Visit: Payer: Self-pay

## 2020-01-10 DIAGNOSIS — N6320 Unspecified lump in the left breast, unspecified quadrant: Secondary | ICD-10-CM

## 2020-01-10 DIAGNOSIS — N6325 Unspecified lump in the left breast, overlapping quadrants: Secondary | ICD-10-CM | POA: Insufficient documentation

## 2020-01-16 ENCOUNTER — Ambulatory Visit: Payer: Self-pay | Admitting: Physician Assistant

## 2020-01-23 ENCOUNTER — Encounter: Payer: Self-pay | Admitting: Family Medicine

## 2020-01-25 ENCOUNTER — Other Ambulatory Visit: Payer: Self-pay

## 2020-01-25 ENCOUNTER — Encounter: Payer: Self-pay | Admitting: Family Medicine

## 2020-01-25 ENCOUNTER — Ambulatory Visit (INDEPENDENT_AMBULATORY_CARE_PROVIDER_SITE_OTHER): Payer: Medicaid Other | Admitting: Family Medicine

## 2020-01-25 DIAGNOSIS — F419 Anxiety disorder, unspecified: Secondary | ICD-10-CM

## 2020-01-25 DIAGNOSIS — G4700 Insomnia, unspecified: Secondary | ICD-10-CM | POA: Insufficient documentation

## 2020-01-25 DIAGNOSIS — F5102 Adjustment insomnia: Secondary | ICD-10-CM

## 2020-01-25 DIAGNOSIS — F321 Major depressive disorder, single episode, moderate: Secondary | ICD-10-CM

## 2020-01-25 MED ORDER — HYDROXYZINE HCL 10 MG PO TABS: 10.0000 mg | ORAL_TABLET | Freq: Two times a day (BID) | ORAL | 1 refills | Status: DC | PRN

## 2020-01-25 NOTE — Progress Notes (Signed)
Virtual Visit via Telephone Note   This visit type was conducted due to national recommendations for restrictions regarding the COVID-19 Pandemic (e.g. social distancing) in an effort to limit this patient's exposure and mitigate transmission in our community.  Due to her co-morbid illnesses, this patient is at least at moderate risk for complications without adequate follow up.  This format is felt to be most appropriate for this patient at this time.  The patient did not have access to video technology/had technical difficulties with video requiring transitioning to audio format only (telephone).  All issues noted in this document were discussed and addressed.  No physical exam could be performed with this format.   Evaluation Performed:  Follow-up visit  Date:  01/25/2020   ID:  Jacqueline [name], DOB 1976-01-01, MRN [id number]  Patient Location: Home Provider Location: Office Location of Patient: Home Location of Provider: Telehealth Consent was obtain for visit to be over via telehealth. I verified that I am speaking with the correct person using two identifiers.  PCP:  Freddy Finner, NP   Chief Complaint: depression   History of Present Illness:    Jacqueline [name] is a 44 y.o. female with with history of GERD, sinusitis respiratory infections, smoking history current everyday smoker, anxiety, depression.  Recent loss of her fianc back at the end of October 2020 for massive heart attack.  They have 90-year-old son they were living in the same home but because that was in the first wife's name she lost her home after his passing.  She has had a lot of changes that have induced panic attacks, anxiety and depression over the last several months.  At her established visit back on 1230 I put her on Lexapro and BuSpar.  She reports today that Lexapro after 7 to 10 days started giving her headaches every day and she could not tolerate it so she ended up stopping that 4 to 5 days ago.  She reports  the BuSpar made her feel worse when she tried to sleep.  She reports that she is not sleeping very well at this time.  She thinks that she can just get some sleep and feel less anxious she would feel a lot better overall.  The patient does not have symptoms concerning for COVID-19 infection (fever, chills, cough, or new shortness of breath).   Past Medical, Surgical, Social History, Allergies, and Medications have been Reviewed.  Past Medical History:  Diagnosis Date  . GERD (gastroesophageal reflux disease)   . Kidney stones    hx UTI  . Sinusitis   . URI (upper respiratory infection)   . UTI (lower urinary tract infection)    Past Surgical History:  Procedure Laterality Date  . DILATION AND CURETTAGE OF UTERUS    . TUBAL LIGATION       Current Meds  Medication Sig  . acetaminophen (TYLENOL) 500 MG tablet Take 500 mg by mouth every 6 (six) hours as needed.  . cetirizine (ZYRTEC) 10 MG tablet Take 10 mg by mouth daily.  . pseudoephedrine (SUDAFED) 120 MG 12 hr tablet Take 120 mg by mouth every 12 (twelve) hours as needed for congestion.     Allergies:   Penicillins   ROS:   Please see the history of present illness.    All other systems reviewed and are negative.   Labs/Other Tests and Data Reviewed:    Recent Labs: 09/19/2019: ALT 13; BUN 12; Creatinine, Ser 0.57; Hemoglobin 14.2; Platelets  383; Potassium 4.2; Sodium 139   Recent Lipid Panel Lab Results  Component Value Date/Time   CHOL 210 (H) 09/19/2019 09:04 AM   TRIG 125 09/19/2019 09:04 AM   HDL 42 09/19/2019 09:04 AM   CHOLHDL 5.0 09/19/2019 09:04 AM   LDLCALC 143 (H) 09/19/2019 09:04 AM    Wt Readings from Last 3 Encounters:  12/28/19 161 lb (73 kg)  01/04/19 162 lb 8 oz (73.7 kg)  07/14/18 164 lb (74.4 kg)     Objective:    Vital Signs:  There were no vitals taken for this visit.   GEN:  alert and oriented  RESPIRATORY:  no shortness of breath in conversation  PSYCH:  anxious, rapid speech, open  communication    Depression screen [facility] 2/9 01/25/2020 12/28/2019  Decreased Interest 0 0  Down, Depressed, Hopeless 2 1  PHQ - 2 Score 2 1  Altered sleeping 3 2  Tired, decreased energy 3 1  Change in appetite 1 2  Feeling bad or failure about yourself  0 0  Trouble concentrating 3 1  Moving slowly or fidgety/restless 1 2  Suicidal thoughts 0 0  PHQ-9 Score 13 9  Difficult doing work/chores Very difficult Very difficult   ASSESSMENT & PLAN:    1. Depression, major, single episode, moderate ([place])  2. Anxiety  - hydrOXYzine (ATARAX/VISTARIL) 10 MG tablet; Take 1 tablet (10 mg total) by mouth 2 (two) times daily as needed for anxiety.  Dispense: 60 tablet; Refill: 1  3. Adjustment insomnia   Time:   Today, I have spent 10 minutes with the patient with telehealth technology discussing the above problems.     Medication Adjustments/Labs and Tests Ordered: Current medicines are reviewed at length with the patient today.  Concerns regarding medicines are outlined above.   Tests Ordered: No orders of the defined types were placed in this encounter.   Medication Changes: No orders of the defined types were placed in this encounter.   Disposition:  Follow up 2 weeks  Signed, Perlie Mayo, NP  01/25/2020 11:20 AM     [place]

## 2020-01-25 NOTE — Assessment & Plan Note (Signed)
Changing from buspar to atarax, anxious and not sleeping well. Lexapro gave headaches so she stopped it.  2 wk follow up   Encouraged self care, gratitude notebook.

## 2020-01-25 NOTE — Assessment & Plan Note (Signed)
Worse, declines daily meds. Is not sleeping will try atarax. 2 week follow up No SI or HI noted.

## 2020-01-25 NOTE — Assessment & Plan Note (Signed)
Unable to sleep, anxious at bedtime, will change to atarax from buspar.  Follow up 2 weeks.

## 2020-01-25 NOTE — Patient Instructions (Addendum)
Happy New Year! May you have a year filled with hope, love, happiness and laughter.  I appreciate the opportunity to provide you with care for your health and wellness. Today we discussed: anxiety and depression, no sleeping   Follow up: 2 weeks by phone   No labs or referrals today  Stop buspar and lexapro  Start atarax nightly. Can take during day if not driving (but can make you sleep*) Each night before bed try writing 3 things you are grateful for in a notebook. Remember your emotions are normal and this is a process.   Please continue to practice social distancing to keep you, your family, and our community safe.  If you must go out, please wear a mask and practice good handwashing.  It was a pleasure to see you and I look forward to continuing to work together on your health and well-being. Please do not hesitate to call the office if you need care or have questions about your care.  Have a wonderful day and week. With Gratitude, Tereasa Coop, DNP, AGNP-BC

## 2020-02-08 ENCOUNTER — Encounter: Payer: Self-pay | Admitting: Family Medicine

## 2020-02-08 ENCOUNTER — Telehealth (INDEPENDENT_AMBULATORY_CARE_PROVIDER_SITE_OTHER): Payer: Medicaid Other | Admitting: Family Medicine

## 2020-02-08 ENCOUNTER — Other Ambulatory Visit: Payer: Self-pay

## 2020-02-08 DIAGNOSIS — F5102 Adjustment insomnia: Secondary | ICD-10-CM

## 2020-02-08 DIAGNOSIS — F419 Anxiety disorder, unspecified: Secondary | ICD-10-CM | POA: Diagnosis not present

## 2020-02-08 DIAGNOSIS — F321 Major depressive disorder, single episode, moderate: Secondary | ICD-10-CM

## 2020-02-08 MED ORDER — FLUOXETINE HCL 10 MG PO TABS: 10.0000 mg | ORAL_TABLET | Freq: Every day | ORAL | 1 refills | Status: DC

## 2020-02-08 NOTE — Progress Notes (Signed)
Virtual Visit via Telephone Note   This visit type was conducted due to national recommendations for restrictions regarding the COVID-19 Pandemic (e.g. social distancing) in an effort to limit this patient's exposure and mitigate transmission in our community.  Due to her co-morbid illnesses, this patient is at least at moderate risk for complications without adequate follow up.  This format is felt to be most appropriate for this patient at this time.  The patient did not have access to video technology/had technical difficulties with video requiring transitioning to audio format only (telephone).  All issues noted in this document were discussed and addressed.  No physical exam could be performed with this format.    Evaluation Performed:  Follow-up visit  Date:  02/08/2020   ID:  Jacqueline [name], DOB 1976-04-09, MRN [id number]  Patient Location: Home Provider Location: Office  Location of Patient: Home Location of Provider: Telehealth Consent was obtain for visit to be over via telehealth. I verified that I am speaking with the correct person using two identifiers.  PCP:  Freddy Finner, NP   Chief Complaint: Depression, anxiety  History of Present Illness:    Jacqueline [name] is a 44 y.o. female with history of GERD, sinusitis respiratory infections, smoking history current everyday smoker, anxiety, depression.  Recent loss of her fianc back at the end of October 2020 for massive heart attack.  They have 45-year-old son they were living in the same home but because that was in the first wife's name she lost her home after his passing.  She has had a lot of changes that have induced panic attacks, anxiety and depression over the last several months.  At her established visit back on 12/30 I put her on Lexapro and BuSpar.  On January 27 we had a follow-up appointment  she was no longer taking the Lexapro.  She reported that she was not sleeping very well and was hoping that she can get some  good sleep coming up piercing and she thinks that that would help her mood.  I started her on Atarax to help with sleep stopped her Lexapro and BuSpar.  And put her in for 2-week follow-up.  Today is her 2-week follow-up. She reports that the Atarax did help her sleep.  Overall she reports that she is feeling much better.  Would like to be on something that helps with her anxiety daily as when she wakes up from the Atarax at times she will be having a panic attack.  But this probably most likely because she is not well controlled right now.  She does not want to try the Lexapro at this time but is open to another option.  The patient does not  have symptoms concerning for COVID-19 infection (fever, chills, cough, or new shortness of breath).   Past Medical, Surgical, Social History, Allergies, and Medications have been Reviewed.  Past Medical History:  Diagnosis Date  . GERD (gastroesophageal reflux disease)   . Kidney stones    hx UTI  . Sinusitis   . URI (upper respiratory infection)   . UTI (lower urinary tract infection)    Past Surgical History:  Procedure Laterality Date  . DILATION AND CURETTAGE OF UTERUS    . TUBAL LIGATION       Current Meds  Medication Sig  . acetaminophen (TYLENOL) 500 MG tablet Take 500 mg by mouth every 6 (six) hours as needed.  . cetirizine (ZYRTEC) 10 MG tablet Take 10  mg by mouth daily.  . hydrOXYzine (ATARAX/VISTARIL) 10 MG tablet Take 1 tablet (10 mg total) by mouth 2 (two) times daily as needed for anxiety.  . pseudoephedrine (SUDAFED) 120 MG 12 hr tablet Take 120 mg by mouth every 12 (twelve) hours as needed for congestion.     Allergies:   Penicillins   ROS:   Please see the history of present illness.    All other systems reviewed and are negative.   Labs/Other Tests and Data Reviewed:    Recent Labs: 09/19/2019: ALT 13; BUN 12; Creatinine, Ser 0.57; Hemoglobin 14.2; Platelets 383; Potassium 4.2; Sodium 139   Recent Lipid Panel Lab  Results  Component Value Date/Time   CHOL 210 (H) 09/19/2019 09:04 AM   TRIG 125 09/19/2019 09:04 AM   HDL 42 09/19/2019 09:04 AM   CHOLHDL 5.0 09/19/2019 09:04 AM   LDLCALC 143 (H) 09/19/2019 09:04 AM    Wt Readings from Last 3 Encounters:  12/28/19 161 lb (73 kg)  01/04/19 162 lb 8 oz (73.7 kg)  07/14/18 164 lb (74.4 kg)     Objective:    Vital Signs:  There were no vitals taken for this visit.   GEN:  alert and oriented RESPIRATORY:  no shortness of breath noted in conversation  PSYCH:  normal affect and mood, good communication     Depression screen [facility] 2/9 02/08/2020 02/08/2020 01/25/2020 12/28/2019  Decreased Interest 0 0 0 0  Down, Depressed, Hopeless 1 1 2 1   PHQ - 2 Score 1 1 2 1   Altered sleeping 0 - 3 2  Tired, decreased energy 0 - 3 1  Change in appetite 1 - 1 2  Feeling bad or failure about yourself  0 - 0 0  Trouble concentrating 1 - 3 1  Moving slowly or fidgety/restless 1 - 1 2  Suicidal thoughts 0 - 0 0  PHQ-9 Score 4 - 13 9  Difficult doing work/chores Somewhat difficult - Very difficult Very difficult    GAD 7 : Generalized Anxiety Score 02/08/2020 12/28/2019  Nervous, Anxious, on Edge 1 1  Control/stop worrying 1 2  Worry too much - different things 1 2  Trouble relaxing 2 3  Restless 2 1  Easily annoyed or irritable 0 2  Afraid - awful might happen 0 2  Total GAD 7 Score 7 13  Anxiety Difficulty Somewhat difficult Extremely difficult    ASSESSMENT & PLAN:    1. Depression, major, single episode, moderate ([facility]) - FLUoxetine (PROZAC) 10 MG tablet; Take 1 tablet (10 mg total) by mouth daily.  Dispense: 30 tablet; Refill: 1  2. Anxiety - FLUoxetine (PROZAC) 10 MG tablet; Take 1 tablet (10 mg total) by mouth daily.  Dispense: 30 tablet; Refill: 1  3. Adjustment insomnia    Time:   Today, I have spent 13 minutes with the patient with telehealth technology discussing the above problems.     Medication Adjustments/Labs and Tests  Ordered: Current medicines are reviewed at length with the patient today.  Concerns regarding medicines are outlined above.   Tests Ordered: No orders of the defined types were placed in this encounter.   Medication Changes: Meds ordered this encounter  Medications  . FLUoxetine (PROZAC) 10 MG tablet    Sig: Take 1 tablet (10 mg total) by mouth daily.    Dispense:  30 tablet    Refill:  1    Order Specific Question:   Supervising Provider    Answer:   Moshe Cipro,  MARGARET E [[id number]]    Disposition:  Follow up next week for annual and TB skin test  Signed, Freddy Finner, NP  02/08/2020 11:12 AM     [facility]

## 2020-02-08 NOTE — Assessment & Plan Note (Signed)
Improved with Atarax.

## 2020-02-08 NOTE — Patient Instructions (Addendum)
Happy New Year! May you have a year filled with hope, love, happiness and laughter.  I appreciate the opportunity to provide you with care for your health and wellness. Today we discussed: overall health   Follow up: annual with TB skin test in next week needs for new job  No labs or referrals today  Try Prozac once daily.  Continue Atarax as needed and for sleep  Please continue to practice social distancing to keep you, your family, and our community safe.  If you must go out, please wear a mask and practice good handwashing.  It was a pleasure to see you and I look forward to continuing to work together on your health and well-being. Please do not hesitate to call the office if you need care or have questions about your care.  Have a wonderful day and week. With Gratitude, Tereasa Coop, DNP, AGNP-BC

## 2020-02-08 NOTE — Assessment & Plan Note (Signed)
Much improved with Atarax for sleep.  PHQ is 4.  Denies SI and HI.  Willing to start a daily medication to help with maintenance.  We will try Prozac at this time.  Close follow-up to make sure that she is doing okay with this.  Advised that she might get headaches from if she can just withhold for 2 weeks they should subside.Patient acknowledged agreement and understanding of the plan.

## 2020-02-08 NOTE — Assessment & Plan Note (Signed)
GAD much improved from 13-7 today.  Reports that she gets anxiety attacks from waking up from her Atarax.  We will start her on Prozac as she needs something to be on daily to keep an even field for her emotional status.  Have educated her on the use of the Prozac and the side effects.  And seeing if she can wait 2 weeks before she tries to stop it again like she did Lexapro.  Most side effects do subside at that time.Patient acknowledged agreement and understanding of the plan.

## 2020-02-13 ENCOUNTER — Other Ambulatory Visit: Payer: Self-pay

## 2020-02-13 MED ORDER — FLUOXETINE HCL 10 MG PO CAPS: 10.0000 mg | ORAL_CAPSULE | Freq: Every day | ORAL | 1 refills | Status: DC

## 2020-02-15 ENCOUNTER — Other Ambulatory Visit: Payer: Self-pay

## 2020-02-15 NOTE — Progress Notes (Signed)
Fluoxetine 10

## 2020-02-16 ENCOUNTER — Encounter: Payer: Medicaid Other | Admitting: Family Medicine

## 2020-02-21 ENCOUNTER — Encounter: Payer: Self-pay | Admitting: Family Medicine

## 2020-02-21 ENCOUNTER — Other Ambulatory Visit: Payer: Self-pay

## 2020-02-21 ENCOUNTER — Ambulatory Visit (INDEPENDENT_AMBULATORY_CARE_PROVIDER_SITE_OTHER): Payer: Medicaid Other | Admitting: Family Medicine

## 2020-02-21 VITALS — BP 118/84 | HR 96 | Temp 97.2°F | Resp 15 | Ht 60.0 in | Wt 161.0 lb

## 2020-02-21 DIAGNOSIS — Z0001 Encounter for general adult medical examination with abnormal findings: Secondary | ICD-10-CM | POA: Diagnosis not present

## 2020-02-21 DIAGNOSIS — R5383 Other fatigue: Secondary | ICD-10-CM

## 2020-02-21 DIAGNOSIS — R030 Elevated blood-pressure reading, without diagnosis of hypertension: Secondary | ICD-10-CM

## 2020-02-21 DIAGNOSIS — F321 Major depressive disorder, single episode, moderate: Secondary | ICD-10-CM | POA: Diagnosis not present

## 2020-02-21 DIAGNOSIS — E669 Obesity, unspecified: Secondary | ICD-10-CM

## 2020-02-21 DIAGNOSIS — Z111 Encounter for screening for respiratory tuberculosis: Secondary | ICD-10-CM

## 2020-02-21 NOTE — Progress Notes (Signed)
Health Maintenance reviewed -   Immunization History  Administered Date(s) Administered  . Influenza,inj,Quad PF,6+ Mos 12/28/2019  . PPD Test 02/21/2020   Last Pap smear: 2019 Last mammogram: July 2020 Last colonoscopy: not due yet  Last DEXA: at age 44  Dentist: Twice yearly  Ophtho: Does not see one yet-might need one soon  Exercise: Walking 5 days a week   Other doctors caring for patient include:  Patient Care Team: Freddy Finner, NP as PCP - General (Family Medicine)  End of Life Discussion:  Patient does not have a living will and medical power of attorney    Code Status: Not on file   Subjective:   HPI  Jacqueline [name] is a 44 y.o. female who presents for annual wellness visit and follow-up on chronic medical conditions.  She has the following concerns: overall doing better she thinks. Some ear trouble, thinks it is pressure related she uses sudafed to help. Reports history of bad infection. Might need ENT referral if continues.  Review Of Systems  Review of Systems  Constitutional: Negative.   HENT: Negative.   Eyes: Negative.   Respiratory: Negative.   Cardiovascular: Negative.   Gastrointestinal: Negative.   Endocrine: Negative.   Genitourinary: Negative.   Musculoskeletal: Negative.   Skin: Negative.   Allergic/Immunologic: Negative.   Neurological: Negative.   Hematological: Negative.   Psychiatric/Behavioral: Positive for sleep disturbance. The patient is nervous/anxious.   All other systems reviewed and are negative.   Objective:   PHYSICAL EXAM:  BP 118/84   Pulse 96   Temp (!) 97.2 F (36.2 C) (Temporal)   Resp 15   Ht 5' (1.524 m)   Wt 161 lb 0.6 oz (73 kg)   SpO2 97%   BMI 31.45 kg/m   Physical Exam Vitals and nursing note reviewed.  Constitutional:      Appearance: Normal appearance. She is obese.  HENT:     Head: Normocephalic.     Right Ear: Ear canal and external ear normal. Tympanic membrane is scarred.     Left  Ear: Ear canal and external ear normal. Tympanic membrane is scarred.     Mouth/Throat:     Comments: Mask in place  Eyes:     Extraocular Movements: Extraocular movements intact.     Conjunctiva/sclera: Conjunctivae normal.     Pupils: Pupils are equal, round, and reactive to light.  Cardiovascular:     Rate and Rhythm: Normal rate and regular rhythm.     Pulses: Normal pulses.          Radial pulses are 2+ on the right side and 2+ on the left side.       Dorsalis pedis pulses are 2+ on the right side and 2+ on the left side.     Heart sounds: Normal heart sounds.  Pulmonary:     Effort: Pulmonary effort is normal.     Breath sounds: Normal breath sounds.  Abdominal:     General: Abdomen is flat. Bowel sounds are normal.     Palpations: Abdomen is soft.  Musculoskeletal:        General: Normal range of motion.     Cervical back: Normal range of motion and neck supple.     Right lower leg: No edema.     Left lower leg: No edema.  Skin:    General: Skin is warm and dry.     Capillary Refill: Capillary refill takes less than 2 seconds.  Neurological:     General: No focal deficit present.     Mental Status: She is alert and oriented to person, place, and time. Mental status is at baseline.     Cranial Nerves: Cranial nerves are intact.     Sensory: Sensation is intact.     Motor: Motor function is intact.     Coordination: Coordination is intact.     Gait: Gait is intact.     Deep Tendon Reflexes: Reflexes are normal and symmetric.  Psychiatric:        Attention and Perception: Attention and perception normal.        Mood and Affect: Mood and affect normal.        Speech: Speech normal.        Behavior: Behavior normal. Behavior is cooperative.        Thought Content: Thought content normal.        Cognition and Memory: Cognition and memory normal.        Judgment: Judgment normal.    Depression Screening  Depression screen [facility] 2/9 02/21/2020 02/08/2020 02/08/2020 01/25/2020  12/28/2019  Decreased Interest 0 0 0 0 0  Down, Depressed, Hopeless 1 1 1 2 1   PHQ - 2 Score 1 1 1 2 1   Altered sleeping 1 0 - 3 2  Tired, decreased energy 1 0 - 3 1  Change in appetite 3 1 - 1 2  Feeling bad or failure about yourself  0 0 - 0 0  Trouble concentrating 2 1 - 3 1  Moving slowly or fidgety/restless 2 1 - 1 2  Suicidal thoughts 0 0 - 0 0  PHQ-9 Score 10 4 - 13 9  Difficult doing work/chores Very difficult Somewhat difficult - Very difficult Very difficult      Assessment & Plan:   1. Annual visit for general adult medical examination with abnormal findings   2. Depression, major, single episode, moderate ([place])   3. Prehypertension   4. Obesity (BMI 30-39.9)   5. Fatigue, unspecified type   6. Encounter for TB tine test     Tests ordered Orders Placed This Encounter  Procedures  . CBC  . COMPLETE METABOLIC PANEL WITH GFR  . Hemoglobin A1c  . Lipid panel  . TSH  . VITAMIN D 25 Hydroxy (Vit-D Deficiency, Fractures)  . TB Skin Test     Plan: Please see assessment and plan per problem list above.   No orders of the defined types were placed in this encounter.   The patient's weight, height, BMI, and visual acuity have been recorded in the chart.  I have made referrals, counseling, and provided education to the patient based on review of the above and I have provided the patient with a written personalized care plan for preventive services.     Perlie Mayo, NP   02/22/2020

## 2020-02-21 NOTE — Patient Instructions (Signed)
May you have a year filled with hope, love, happiness and laughter.  I appreciate the opportunity to provide you with care for your health and wellness. Today we discussed: overall care  Follow up: 4 weeks by phone anxiety and depression   Labs in next few days fasting   TB skin today  Please continue to practice social distancing to keep you, your family, and our community safe.  If you must go out, please wear a mask and practice good handwashing.  It was a pleasure to see you and I look forward to continuing to work together on your health and well-being. Please do not hesitate to call the office if you need care or have questions about your care.  Have a wonderful day and week. With Gratitude, Tereasa Coop, DNP, AGNP-BC     HEALTH MAINTENANCE RECOMMENDATIONS:  It is recommended that you get at least 30 minutes of aerobic exercise at least 5 days/week (for weight loss, you may need as much as 60-90 minutes). This can be any activity that gets your heart rate up. This can be divided in 10-15 minute intervals if needed, but try and build up your endurance at least once a week.  Weight bearing exercise is also recommended twice weekly.  Eat a healthy diet with lots of vegetables, fruits and fiber.  "Colorful" foods have a lot of vitamins (ie green vegetables, tomatoes, red peppers, etc).  Limit sweet tea, regular sodas and alcoholic beverages, all of which has a lot of calories and sugar.  Up to 1 alcoholic drink daily may be beneficial for women (unless trying to lose weight, watch sugars).  Drink a lot of water.  Calcium recommendations are 1200-1500 mg daily (1500 mg for postmenopausal women or women without ovaries), and vitamin D 1000 IU daily.  This should be obtained from diet and/or supplements (vitamins), and calcium should not be taken all at once, but in divided doses.  Monthly self breast exams and yearly mammograms for women over the age of 88 is recommended.  Sunscreen  of at least SPF 30 should be used on all sun-exposed parts of the skin when outside between the hours of 10 am and 4 pm (not just when at beach or pool, but even with exercise, golf, tennis, and yard work!)  Use a sunscreen that says "broad spectrum" so it covers both UVA and UVB rays, and make sure to reapply every 1-2 hours.  Remember to change the batteries in your smoke detectors when changing your clock times in the spring and fall.  Use your seat belt every time you are in a car, and please drive safely and not be distracted with cell phones and texting while driving.

## 2020-02-22 ENCOUNTER — Encounter: Payer: Self-pay | Admitting: Family Medicine

## 2020-02-22 DIAGNOSIS — Z111 Encounter for screening for respiratory tuberculosis: Secondary | ICD-10-CM

## 2020-02-22 DIAGNOSIS — Z0001 Encounter for general adult medical examination with abnormal findings: Secondary | ICD-10-CM

## 2020-02-22 DIAGNOSIS — R5383 Other fatigue: Secondary | ICD-10-CM | POA: Insufficient documentation

## 2020-02-22 HISTORY — DX: Encounter for general adult medical examination with abnormal findings: Z00.01

## 2020-02-22 HISTORY — DX: Encounter for screening for respiratory tuberculosis: Z11.1

## 2020-02-22 NOTE — Assessment & Plan Note (Signed)
Discussed monthly self breast exams and yearly mammograms; at least 30 minutes of aerobic activity at least 5 days/week and weight-bearing exercise 2x/week; proper sunscreen use reviewed; healthy diet, including goals of calcium and vitamin D intake and alcohol recommendations (less than or equal to 1 drink/day) reviewed; regular seatbelt use; changing batteries in smoke detectors.  Immunization recommendations discussed.  Colonoscopy recommendations reviewed.  

## 2020-02-22 NOTE — Assessment & Plan Note (Signed)
Jacqueline [name] is encouraged to maintain a well balanced diet that is low in salt. Controlled, continue current medication regimen. Refills provided  Additionally, she is also reminded that exercise is beneficial for heart health and control of  Blood pressure. 30-60 minutes daily is recommended-walking was suggested.

## 2020-02-22 NOTE — Assessment & Plan Note (Signed)
Needs for work-provided today. Reading Thursday afternoon

## 2020-02-22 NOTE — Assessment & Plan Note (Signed)
Has not started the Prozac yet, reports she will get today. Denies SI and HI.

## 2020-02-22 NOTE — Assessment & Plan Note (Signed)
Jacqueline [name] is re-educated about the importance of exercise daily to help with weight management. A minumum of 30 minutes daily is recommended. Additionally, importance of healthy food choices  with portion control discussed.   Wt Readings from Last 3 Encounters:  02/21/20 161 lb 0.6 oz (73 kg)  12/28/19 161 lb (73 kg)  01/04/19 162 lb 8 oz (73.7 kg)

## 2020-02-22 NOTE — Assessment & Plan Note (Signed)
On going-will get labs, might be from depression and poor sleep still.

## 2020-02-23 ENCOUNTER — Encounter: Payer: Self-pay | Admitting: Family Medicine

## 2020-02-23 LAB — TB SKIN TEST
Induration: 0 mm
TB Skin Test: NEGATIVE

## 2020-02-24 LAB — CBC
HCT: 42.1 % (ref 35.0–45.0)
Hemoglobin: 14.5 g/dL (ref 11.7–15.5)
MCH: 28.8 pg (ref 27.0–33.0)
MCHC: 34.4 g/dL (ref 32.0–36.0)
MCV: 83.7 fL (ref 80.0–100.0)
MPV: 9.5 fL (ref 7.5–12.5)
Platelets: 372 10*3/uL (ref 140–400)
RBC: 5.03 10*6/uL (ref 3.80–5.10)
RDW: 14.2 % (ref 11.0–15.0)
WBC: 8.6 10*3/uL (ref 3.8–10.8)

## 2020-02-24 LAB — LIPID PANEL
Cholesterol: 189 mg/dL (ref <200)
HDL: 45 mg/dL — ABNORMAL LOW (ref >=50)
LDL Cholesterol (Calc): 128 mg/dL (calc) — ABNORMAL HIGH
Non-HDL Cholesterol (Calc): 144 mg/dL (calc) — ABNORMAL HIGH (ref <130)
Total CHOL/HDL Ratio: 4.2 (calc) (ref <5.0)
Triglycerides: 66 mg/dL (ref <150)

## 2020-02-24 LAB — COMPLETE METABOLIC PANEL WITH GFR
AG Ratio: 1.7 (calc) (ref 1.0–2.5)
ALT: 14 U/L (ref 6–29)
AST: 14 U/L (ref 10–30)
Albumin: 4.3 g/dL (ref 3.6–5.1)
Alkaline phosphatase (APISO): 63 U/L (ref 31–125)
BUN: 8 mg/dL (ref 7–25)
CO2: 26 mmol/L (ref 20–32)
Calcium: 9.2 mg/dL (ref 8.6–10.2)
Chloride: 106 mmol/L (ref 98–110)
Creat: 0.59 mg/dL (ref 0.50–1.10)
GFR, Est African American: 132 mL/min/{1.73_m2} (ref >=60)
GFR, Est Non African American: 114 mL/min/{1.73_m2} (ref >=60)
Globulin: 2.5 g/dL (calc) (ref 1.9–3.7)
Glucose, Bld: 85 mg/dL (ref 65–99)
Potassium: 4.2 mmol/L (ref 3.5–5.3)
Sodium: 139 mmol/L (ref 135–146)
Total Bilirubin: 0.7 mg/dL (ref 0.2–1.2)
Total Protein: 6.8 g/dL (ref 6.1–8.1)

## 2020-02-24 LAB — HEMOGLOBIN A1C
Hgb A1c MFr Bld: 5.3 % of total Hgb (ref <5.7)
Mean Plasma Glucose: 105 (calc)
eAG (mmol/L): 5.8 (calc)

## 2020-02-24 LAB — VITAMIN D 25 HYDROXY (VIT D DEFICIENCY, FRACTURES): Vit D, 25-Hydroxy: 13 ng/mL — ABNORMAL LOW (ref 30–100)

## 2020-02-24 LAB — TSH: TSH: 1.36 mIU/L

## 2020-02-27 ENCOUNTER — Telehealth: Payer: Self-pay

## 2020-02-27 NOTE — Telephone Encounter (Signed)
Staff Health Assestment Copied Noted  Sleeved

## 2020-03-01 DIAGNOSIS — F321 Major depressive disorder, single episode, moderate: Secondary | ICD-10-CM

## 2020-03-20 ENCOUNTER — Encounter: Payer: Self-pay | Admitting: Family Medicine

## 2020-03-20 ENCOUNTER — Other Ambulatory Visit: Payer: Self-pay

## 2020-03-20 ENCOUNTER — Telehealth (INDEPENDENT_AMBULATORY_CARE_PROVIDER_SITE_OTHER): Payer: Medicaid Other | Admitting: Family Medicine

## 2020-03-20 VITALS — BP 118/84 | Ht 60.0 in | Wt 158.0 lb

## 2020-03-20 DIAGNOSIS — F321 Major depressive disorder, single episode, moderate: Secondary | ICD-10-CM

## 2020-03-20 DIAGNOSIS — F5102 Adjustment insomnia: Secondary | ICD-10-CM | POA: Diagnosis not present

## 2020-03-20 DIAGNOSIS — E559 Vitamin D deficiency, unspecified: Secondary | ICD-10-CM | POA: Diagnosis not present

## 2020-03-20 DIAGNOSIS — F419 Anxiety disorder, unspecified: Secondary | ICD-10-CM | POA: Diagnosis not present

## 2020-03-20 MED ORDER — VITAMIN D (ERGOCALCIFEROL) 1.25 MG (50000 UNIT) PO CAPS: 50000.0000 [IU] | ORAL_CAPSULE | ORAL | 1 refills | Status: DC

## 2020-03-20 NOTE — Assessment & Plan Note (Signed)
Sent in vitamin D to be taken once a month for 12 weeks.  Reviewed side effects, risks and benefits of medication.   Patient acknowledged agreement and understanding of the plan.

## 2020-03-20 NOTE — Assessment & Plan Note (Signed)
Improved since starting working on Prozac.

## 2020-03-20 NOTE — Progress Notes (Signed)
Virtual Visit via Telephone Note   This visit type was conducted due to national recommendations for restrictions regarding the COVID-19 Pandemic (e.g. social distancing) in an effort to limit this patient's exposure and mitigate transmission in our community.  Due to her co-morbid illnesses, this patient is at least at moderate risk for complications without adequate follow up.  This format is felt to be most appropriate for this patient at this time.  The patient did not have access to video technology/had technical difficulties with video requiring transitioning to audio format only (telephone).  All issues noted in this document were discussed and addressed.  No physical exam could be performed with this format.   Evaluation Performed:  Follow-up visit  Date:  03/20/2020   ID:  Jacqueline [name], DOB October 12, 1976, MRN [id number]  Patient Location: Home Provider Location: Office  Location of Patient: Home Location of Provider: Telehealth Consent was obtain for visit to be over via telehealth. I verified that I am speaking with the correct person using two identifiers.  PCP:  Freddy Finner, NP   Chief Complaint:   Follow-up on depression  History of Present Illness:    Jacqueline [name] is a 44 y.o. female with history of acid reflux, urinary tract infection, depression, anxiety recent loss of fianc last year.  Has not started Prozac as of last month reported that she was can start it after that appointment.  Had lab work drawn need to get vitamin D picked up as well.  Reports that she is sleeping so much better now that she is working and taking the Prozac.  Reports overall she is felt much better her anxiety is reduced and she just feels like her purpose is now reinstated now that she is working.  She denies having any headaches, vision changes, dizziness, chest pain, leg swelling, cough, shortness of breath fevers chills changes in bowel or bladder habits.  Reports overall that she is doing  great would like to stay on the Prozac at this time and will start the vitamin D as of today.  The patient does not have symptoms concerning for COVID-19 infection (fever, chills, cough, or new shortness of breath).   Past Medical, Surgical, Social History, Allergies, and Medications have been Reviewed.  Past Medical History:  Diagnosis Date  . GERD (gastroesophageal reflux disease)   . Kidney stones    hx UTI  . Need for immunization against influenza 12/29/2019  . Sinusitis   . URI (upper respiratory infection)   . UTI (lower urinary tract infection)    Past Surgical History:  Procedure Laterality Date  . DILATION AND CURETTAGE OF UTERUS    . TUBAL LIGATION       Current Meds  Medication Sig  . acetaminophen (TYLENOL) 500 MG tablet Take 500 mg by mouth every 6 (six) hours as needed.  . cetirizine (ZYRTEC) 10 MG tablet Take 10 mg by mouth daily.  Marland Kitchen FLUoxetine (PROZAC) 10 MG capsule Take 1 capsule (10 mg total) by mouth daily.  . hydrOXYzine (ATARAX/VISTARIL) 10 MG tablet Take 1 tablet (10 mg total) by mouth 2 (two) times daily as needed for anxiety.  . pseudoephedrine (SUDAFED) 120 MG 12 hr tablet Take 120 mg by mouth every 12 (twelve) hours as needed for congestion.     Allergies:   Penicillins   ROS:   Please see the history of present illness.    All other systems reviewed and are negative.   Labs/Other Tests  and Data Reviewed:    Recent Labs: 02/23/2020: ALT 14; BUN 8; Creat 0.59; Hemoglobin 14.5; Platelets 372; Potassium 4.2; Sodium 139; TSH 1.36   Recent Lipid Panel Lab Results  Component Value Date/Time   CHOL 189 02/23/2020 08:03 AM   TRIG 66 02/23/2020 08:03 AM   HDL 45 (L) 02/23/2020 08:03 AM   CHOLHDL 4.2 02/23/2020 08:03 AM   LDLCALC 128 (H) 02/23/2020 08:03 AM    Wt Readings from Last 3 Encounters:  03/20/20 158 lb (71.7 kg)  02/21/20 161 lb 0.6 oz (73 kg)  12/28/19 161 lb (73 kg)     Objective:    Vital Signs:  BP 118/84   Ht 5' (1.524 m)    Wt 158 lb (71.7 kg)   BMI 30.86 kg/m    VITAL SIGNS:  reviewed GEN:  Alert and oriented RESPIRATORY:  No shortness of breath noted in conversation PSYCH:  Normal, pleasant affect and good communication   Depression screen [facility] 2/9 03/20/2020 02/21/2020 02/08/2020  Decreased Interest 0 0 0  Down, Depressed, Hopeless 0 1 1  PHQ - 2 Score 0 1 1  Altered sleeping 0 1 0  Tired, decreased energy 2 1 0  Change in appetite 2 3 1   Feeling bad or failure about yourself  0 0 0  Trouble concentrating 0 2 1  Moving slowly or fidgety/restless 0 2 1  Suicidal thoughts 0 0 0  PHQ-9 Score 4 10 4   Difficult doing work/chores Somewhat difficult Very difficult Somewhat difficult     ASSESSMENT & PLAN:    1. Depression, major, single episode, moderate ([place])  2. Vitamin D deficiency  - Vitamin D, Ergocalciferol, (DRISDOL) 1.25 MG (50000 UNIT) CAPS capsule; Take 1 capsule (50,000 Units total) by mouth every 7 (seven) days.  Dispense: 12 capsule; Refill: 1   3. Adjustment insomnia   4. Anxiety    Time:   Today, I have spent 20 minutes with the patient with telehealth technology discussing the above problems.     Medication Adjustments/Labs and Tests Ordered: Current medicines are reviewed at length with the patient today.  Concerns regarding medicines are outlined above.   Tests Ordered: No orders of the defined types were placed in this encounter.   Medication Changes: No orders of the defined types were placed in this encounter.   Disposition:  Follow up 3 months   Signed, Perlie Mayo, NP  03/20/2020 9:47 AM     [place]

## 2020-03-20 NOTE — Assessment & Plan Note (Signed)
She reports anxiety is little to none.  Much improved since starting the Prozac and use of hydroxyzine as needed.  Continue use of these.  Follow-up in 3 months. Patient acknowledged agreement and understanding of the plan.

## 2020-03-20 NOTE — Patient Instructions (Signed)
I appreciate the opportunity to provide you with care for your health and wellness. Today we discussed: mood   Follow up: 3 months in office   No labs or referrals today  SO GLAD YOU ARE FEELING BETTER :)  Start taking Vitamin D and continue the other medications as prescribed.   Please continue to practice social distancing to keep you, your family, and our community safe.  If you must go out, please wear a mask and practice good handwashing.  It was a pleasure to see you and I look forward to continuing to work together on your health and well-being. Please do not hesitate to call the office if you need care or have questions about your care.  Have a wonderful day and week. With Gratitude, Tereasa Coop, DNP, AGNP-BC

## 2020-03-20 NOTE — Assessment & Plan Note (Signed)
Much improved PHQ-9 scores of 4 down from 10.  Reports that the Prozac has helped tremendously.  As well as working more consistently she is sleeping better as well.  Denies having any SI or HI today.  Is wanting to stay on the Prozac for now.  Follow-up in 3 months. Patient acknowledged agreement and understanding of the plan.

## 2020-05-29 ENCOUNTER — Ambulatory Visit: Payer: Self-pay | Admitting: Physician Assistant

## 2020-06-03 ENCOUNTER — Encounter: Payer: Self-pay | Admitting: Family Medicine

## 2020-06-04 ENCOUNTER — Other Ambulatory Visit: Payer: Self-pay | Admitting: *Deleted

## 2020-06-04 MED ORDER — FLUOXETINE HCL 10 MG PO CAPS: 10.0000 mg | ORAL_CAPSULE | Freq: Every day | ORAL | 1 refills | Status: DC

## 2020-06-05 ENCOUNTER — Encounter: Payer: Self-pay | Admitting: Family Medicine

## 2020-06-05 ENCOUNTER — Other Ambulatory Visit: Payer: Self-pay

## 2020-06-05 ENCOUNTER — Telehealth (INDEPENDENT_AMBULATORY_CARE_PROVIDER_SITE_OTHER): Payer: Medicaid Other | Admitting: Family Medicine

## 2020-06-05 VITALS — BP 118/84 | Ht 60.0 in | Wt 158.0 lb

## 2020-06-05 DIAGNOSIS — F321 Major depressive disorder, single episode, moderate: Secondary | ICD-10-CM

## 2020-06-05 MED ORDER — FLUOXETINE HCL 20 MG PO CAPS: 20.0000 mg | ORAL_CAPSULE | Freq: Every day | ORAL | 2 refills | Status: DC

## 2020-06-05 NOTE — Progress Notes (Signed)
Virtual Visit via Telephone Note   This visit type was conducted due to national recommendations for restrictions regarding the COVID-19 Pandemic (e.g. social distancing) in an effort to limit this patient's exposure and mitigate transmission in our community.  Due to her co-morbid illnesses, this patient is at least at moderate risk for complications without adequate follow up.  This format is felt to be most appropriate for this patient at this time.  The patient did not have access to video technology/had technical difficulties with video requiring transitioning to audio format only (telephone).  All issues noted in this document were discussed and addressed.  No physical exam could be performed with this format.    Evaluation Performed:  Follow-up visit  Date:  06/05/2020   ID:  Jacqueline [name], DOB 1976/10/26, MRN [id number]  Patient Location: Home Provider Location: Office  Location of Patient: Home Location of Provider: Telehealth Consent was obtain for visit to be over via telehealth. I verified that I am speaking with the correct person using two identifiers.  PCP:  Perlie Mayo, NP   Chief Complaint:  Depression   History of Present Illness:    Jacqueline [name] is a 44 y.o. female with increased anxiety and depression over the last several weeks. Was doing well on her Prozac at 10mg , but now reports changes to this. Her PHQ and GAD have increased. She denies changes in life. She is working and enjoys her job. Denies SI or HI. Overall feels happy and well, but has increased in moments where she is not. Is open in increase in medication  The patient does not have symptoms concerning for COVID-19 infection (fever, chills, cough, or new shortness of breath).   Past Medical, Surgical, Social History, Allergies, and Medications have been Reviewed.  Past Medical History:  Diagnosis Date  . Annual visit for general adult medical examination with abnormal findings 02/22/2020  .  Encounter for TB tine test 02/22/2020  . GERD (gastroesophageal reflux disease)   . Kidney stones    hx UTI  . Need for immunization against influenza 12/29/2019  . Sinusitis   . URI (upper respiratory infection)   . UTI (lower urinary tract infection)    Past Surgical History:  Procedure Laterality Date  . DILATION AND CURETTAGE OF UTERUS    . TUBAL LIGATION       Current Meds  Medication Sig  . acetaminophen (TYLENOL) 500 MG tablet Take 500 mg by mouth every 6 (six) hours as needed.  . cetirizine (ZYRTEC) 10 MG tablet Take 10 mg by mouth daily.  Marland Kitchen FLUoxetine (PROZAC) 10 MG capsule Take 1 capsule (10 mg total) by mouth daily.  . hydrOXYzine (ATARAX/VISTARIL) 10 MG tablet Take 1 tablet (10 mg total) by mouth 2 (two) times daily as needed for anxiety.  . pseudoephedrine (SUDAFED) 120 MG 12 hr tablet Take 120 mg by mouth every 12 (twelve) hours as needed for congestion.  . Vitamin D, Ergocalciferol, (DRISDOL) 1.25 MG (50000 UNIT) CAPS capsule Take 1 capsule (50,000 Units total) by mouth every 7 (seven) days.     Allergies:   Penicillins   ROS:   Please see the history of present illness.    All other systems reviewed and are negative.   Labs/Other Tests and Data Reviewed:    Recent Labs: 02/23/2020: ALT 14; BUN 8; Creat 0.59; Hemoglobin 14.5; Platelets 372; Potassium 4.2; Sodium 139; TSH 1.36   Recent Lipid Panel Lab Results  Component Value Date/Time  CHOL 189 02/23/2020 08:03 AM   TRIG 66 02/23/2020 08:03 AM   HDL 45 (L) 02/23/2020 08:03 AM   CHOLHDL 4.2 02/23/2020 08:03 AM   LDLCALC 128 (H) 02/23/2020 08:03 AM    Wt Readings from Last 3 Encounters:  06/05/20 158 lb (71.7 kg)  03/20/20 158 lb (71.7 kg)  02/21/20 161 lb 0.6 oz (73 kg)     Objective:    Vital Signs:  BP 118/84   Ht 5' (1.524 m)   Wt 158 lb (71.7 kg)   BMI 30.86 kg/m    VITAL SIGNS:  reviewed GEN:  alert and oriented  RESPIRATORY:  no shortness of breath in conversation  PSYCH:  normal  affect and mood    Depression screen [facility] 2/9 06/05/2020 03/20/2020 02/21/2020 02/08/2020 02/08/2020  Decreased Interest 2 0 0 0 0  Down, Depressed, Hopeless 2 0 1 1 1   PHQ - 2 Score 4 0 1 1 1   Altered sleeping 2 0 1 0 -  Tired, decreased energy 2 2 1  0 -  Change in appetite 2 2 3 1  -  Feeling bad or failure about yourself  0 0 0 0 -  Trouble concentrating 2 0 2 1 -  Moving slowly or fidgety/restless 0 0 2 1 -  Suicidal thoughts 0 0 0 0 -  PHQ-9 Score 12 4 10 4  -  Difficult doing work/chores Somewhat difficult Somewhat difficult Very difficult Somewhat difficult -    GAD 7 : Generalized Anxiety Score 06/05/2020 03/20/2020 02/21/2020 02/08/2020  Nervous, Anxious, on Edge 2 0 2 1  Control/stop worrying 0 0 0 1  Worry too much - different things 1 0 1 1  Trouble relaxing 2 1 3 2   Restless 0 1 3 2   Easily annoyed or irritable 2 1 2  0  Afraid - awful might happen 0 0 1 0  Total GAD 7 Score 7 3 12 7   Anxiety Difficulty Somewhat difficult Somewhat difficult Somewhat difficult Somewhat difficult      ASSESSMENT & PLAN:    1. Depression, major, single episode, moderate ([facility])  - FLUoxetine (PROZAC) 20 MG capsule; Take 1 capsule (20 mg total) by mouth daily.  Dispense: 30 capsule; Refill: 2  Time:   Today, I have spent 10 minutes with the patient with telehealth technology discussing the above problems.     Medication Adjustments/Labs and Tests Ordered: Current medicines are reviewed at length with the patient today.  Concerns regarding medicines are outlined above.   Tests Ordered: No orders of the defined types were placed in this encounter.   Medication Changes: No orders of the defined types were placed in this encounter.   Disposition:  Follow up 6-8 weeks  Signed, , NP  06/05/2020 4:01 PM     03/22/2020 [facility]

## 2020-06-05 NOTE — Patient Instructions (Signed)
I appreciate the opportunity to provide you with care for your health and wellness. Today we discussed: increase in Prozac   Follow up: 6-8 weeks  No labs or referrals today  Prozac increased to 20 mg  Please call if you do not start to feel a little better around 4-6 weeks in from increase dose.  Please continue to practice social distancing to keep you, your family, and our community safe.  If you must go out, please wear a mask and practice good handwashing.  It was a pleasure to see you and I look forward to continuing to work together on your health and well-being. Please do not hesitate to call the office if you need care or have questions about your care.  Have a wonderful day and week. With Gratitude, Tereasa Coop, DNP, AGNP-BC

## 2020-06-05 NOTE — Assessment & Plan Note (Signed)
PHQ increased to 12 Will increase Prozac Denies SI and HI during visit. Follow up in 6-8 weeks. Patient acknowledged agreement and understanding of the plan.

## 2020-07-17 ENCOUNTER — Other Ambulatory Visit: Payer: Self-pay

## 2020-07-17 ENCOUNTER — Encounter: Payer: Self-pay | Admitting: Family Medicine

## 2020-07-17 ENCOUNTER — Telehealth (INDEPENDENT_AMBULATORY_CARE_PROVIDER_SITE_OTHER): Payer: Medicaid Other | Admitting: Family Medicine

## 2020-07-17 VITALS — BP 118/84 | Ht 60.0 in | Wt 158.0 lb

## 2020-07-17 DIAGNOSIS — F321 Major depressive disorder, single episode, moderate: Secondary | ICD-10-CM

## 2020-07-17 MED ORDER — FLUOXETINE HCL 20 MG PO CAPS: 20.0000 mg | ORAL_CAPSULE | Freq: Every day | ORAL | 0 refills | Status: DC

## 2020-07-17 NOTE — Progress Notes (Signed)
Virtual Visit via Telephone Note   This visit type was conducted due to national recommendations for restrictions regarding the COVID-19 Pandemic (e.g. social distancing) in an effort to limit this patient's exposure and mitigate transmission in our community.  Due to her co-morbid illnesses, this patient is at least at moderate risk for complications without adequate follow up.  This format is felt to be most appropriate for this patient at this time.  The patient did not have access to video technology/had technical difficulties with video requiring transitioning to audio format only (telephone).  All issues noted in this document were discussed and addressed.  No physical exam could be performed with this format.   Evaluation Performed:  Follow-up visit  Date:  07/17/2020   ID:  Jacqueline [name], DOB 1976/10/12, MRN [id number]  Patient Location: Home Provider Location: Office/Clinic  Location of Patient: Home Location of Provider: Telehealth Consent was obtain for visit to be over via telehealth. I verified that I am speaking with the correct person using two identifiers.  PCP:  Freddy Finner, NP   Chief Complaint:  Follow up on mood  History of Present Illness:    Jacqueline [name] is a 44 y.o. female here for follow up after having increase in Prozac 4 weeks ago due to increase with anxiety and depression. She was doing well on her Prozac at 10mg , but had reported changes to this at last visit. Her PHQ and GAD had increased at that visit. Today she reports improvement. Prozac was increased to 20 mg at that last visit.  She is working and enjoys her job. Denies SI or HI. Overall feels happy and well, is ready to talk to a therapist about the sudden passing of her fiance.   The patient does not have symptoms concerning for COVID-19 infection (fever, chills, cough, or new shortness of breath).   Past Medical, Surgical, Social History, Allergies, and Medications have been Reviewed.  Past  Medical History:  Diagnosis Date  . Annual visit for general adult medical examination with abnormal findings 02/22/2020  . Encounter for TB tine test 02/22/2020  . GERD (gastroesophageal reflux disease)   . Kidney stones    hx UTI  . Need for immunization against influenza 12/29/2019  . Sinusitis   . URI (upper respiratory infection)   . UTI (lower urinary tract infection)    Past Surgical History:  Procedure Laterality Date  . DILATION AND CURETTAGE OF UTERUS    . TUBAL LIGATION       No outpatient medications have been marked as taking for the 07/17/20 encounter (Appointment) with 07/19/20, NP.     Allergies:   Penicillins   ROS:   Please see the history of present illness.    All other systems reviewed and are negative.   Labs/Other Tests and Data Reviewed:    Recent Labs: 02/23/2020: ALT 14; BUN 8; Creat 0.59; Hemoglobin 14.5; Platelets 372; Potassium 4.2; Sodium 139; TSH 1.36   Recent Lipid Panel Lab Results  Component Value Date/Time   CHOL 189 02/23/2020 08:03 AM   TRIG 66 02/23/2020 08:03 AM   HDL 45 (L) 02/23/2020 08:03 AM   CHOLHDL 4.2 02/23/2020 08:03 AM   LDLCALC 128 (H) 02/23/2020 08:03 AM    Wt Readings from Last 3 Encounters:  06/05/20 158 lb (71.7 kg)  03/20/20 158 lb (71.7 kg)  02/21/20 161 lb 0.6 oz (73 kg)     Objective:    Vital Signs:  There were no vitals taken for this visit.   VITAL SIGNS:  reviewed GEN:  alert and oriented  RESPIRATORY:  no shortness of breath in conversation PSYCH:  normal affect and mood  Depression screen [facility] 2/9 07/17/2020 06/05/2020 03/20/2020 02/21/2020 02/08/2020  Decreased Interest 0 2 0 0 0  Down, Depressed, Hopeless 0 2 0 1 1  PHQ - 2 Score 0 4 0 1 1  Altered sleeping 1 2 0 1 0  Tired, decreased energy 1 2 2 1  0  Change in appetite 1 2 2 3 1   Feeling bad or failure about yourself  0 0 0 0 0  Trouble concentrating 0 2 0 2 1  Moving slowly or fidgety/restless 0 0 0 2 1  Suicidal thoughts 0 0 0 0 0    PHQ-9 Score 3 12 4 10 4   Difficult doing work/chores Not difficult at all Somewhat difficult Somewhat difficult Very difficult Somewhat difficult    GAD 7 : Generalized Anxiety Score 07/17/2020 06/05/2020 03/20/2020 02/21/2020  Nervous, Anxious, on Edge 1 2 0 2  Control/stop worrying 0 0 0 0  Worry too much - different things 0 1 0 1  Trouble relaxing 0 2 1 3   Restless 0 0 1 3  Easily annoyed or irritable 0 2 1 2   Afraid - awful might happen 0 0 0 1  Total GAD 7 Score 1 7 3 12   Anxiety Difficulty Not difficult at all Somewhat difficult Somewhat difficult Somewhat difficult    ASSESSMENT & PLAN:     1. Depression, major, single episode, moderate (HCC)  Time:   Today, I have spent 10 minutes with the patient with telehealth technology discussing the above problems.     Medication Adjustments/Labs and Tests Ordered: Current medicines are reviewed at length with the patient today.  Concerns regarding medicines are outlined above.   Tests Ordered: No orders of the defined types were placed in this encounter.   Medication Changes: No orders of the defined types were placed in this encounter.   Disposition:  Follow up 3 months Signed, 08/05/2020, NP  07/17/2020 4:26 PM     02/23/2020 [facility]

## 2020-07-17 NOTE — Assessment & Plan Note (Addendum)
Denies SI and HI during visit. Follow up in 3 months Continue Prozac Referral to LSW to help with talking about trauma related to sudden dead of SO.

## 2020-08-08 ENCOUNTER — Encounter: Payer: Self-pay | Admitting: Family Medicine

## 2020-08-08 ENCOUNTER — Ambulatory Visit (INDEPENDENT_AMBULATORY_CARE_PROVIDER_SITE_OTHER): Payer: Medicaid Other | Admitting: Family Medicine

## 2020-08-08 ENCOUNTER — Other Ambulatory Visit: Payer: Self-pay

## 2020-08-08 VITALS — BP 122/82 | HR 82 | Temp 97.0°F | Resp 18 | Ht 60.0 in | Wt 169.0 lb

## 2020-08-08 DIAGNOSIS — M5431 Sciatica, right side: Secondary | ICD-10-CM | POA: Diagnosis not present

## 2020-08-08 MED ORDER — METHYLPREDNISOLONE ACETATE 80 MG/ML IJ SUSP
80.0000 mg | Freq: Once | INTRAMUSCULAR | Status: AC
  Administered 2020-08-08: 80 mg via INTRAMUSCULAR

## 2020-08-08 MED ORDER — PREDNISONE 10 MG (21) PO TBPK: ORAL_TABLET | ORAL | 0 refills | Status: DC

## 2020-08-08 MED ORDER — KETOROLAC TROMETHAMINE 60 MG/2ML IM SOLN
60.0000 mg | Freq: Once | INTRAMUSCULAR | Status: AC
  Administered 2020-08-08: 60 mg via INTRAMUSCULAR

## 2020-08-08 NOTE — Progress Notes (Signed)
Subjective:  Patient ID: Jacqueline [name], female    DOB: 1976/03/31  Age: 44 y.o. MRN: [id number]  CC:  Chief Complaint  Patient presents with  . Acute Visit      HPI  HPI   Ms Rutan has a history of having sciatica problems on the left.  However she reports today that she is having the same discomfort that she experiences with sciatica on the left but this time on the right.  She reports it hurts to sit for long periods of time, stand for long periods of time or do anything that requires her to twist or bend.  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  She tried Tylenol, 100 mg of ibuprofen IcyHot and lidocaine as well as Salonpas patches.  She reports that heat helps the most.  However she continues to have to do the lifting and bending at work so she is not getting any better. She denies having any trauma or injury remotely that she is aware of.  Today patient denies signs and symptoms of COVID 19 infection including fever, chills, cough, shortness of breath, and headache. Past Medical, Surgical, Social History, Allergies, and Medications have been Reviewed.   Past Medical History:  Diagnosis Date  . Annual visit for general adult medical examination with abnormal findings 02/22/2020  . Encounter for TB tine test 02/22/2020  . GERD (gastroesophageal reflux disease)   . Kidney stones    hx UTI  . Need for immunization against influenza 12/29/2019  . Sinusitis   . URI (upper respiratory infection)   . UTI (lower urinary tract infection)     Current Meds  Medication Sig  . acetaminophen (TYLENOL) 500 MG tablet Take 500 mg by mouth every 6 (six) hours as needed.  . cetirizine (ZYRTEC) 10 MG tablet Take 10 mg by mouth daily.  Marland Kitchen FLUoxetine (PROZAC) 20 MG capsule Take 1 capsule (20 mg total) by mouth daily.  . pseudoephedrine (SUDAFED) 120 MG 12 hr tablet Take 120 mg by mouth every 12 (twelve) hours as needed for  congestion.  . Vitamin D, Ergocalciferol, (DRISDOL) 1.25 MG (50000 UNIT) CAPS capsule Take 1 capsule (50,000 Units total) by mouth every 7 (seven) days.   Current Facility-Administered Medications for the 08/08/20 encounter (Office Visit) with Freddy Finner, NP  Medication  . ketorolac (TORADOL) injection 60 mg  . methylPREDNISolone acetate (DEPO-MEDROL) injection 80 mg    ROS:  Review of Systems  Constitutional: Negative.   HENT: Negative.   Eyes: Negative.   Respiratory: Negative.   Cardiovascular: Negative.   Gastrointestinal: Negative.   Genitourinary: Negative.   Musculoskeletal: Positive for back pain.  Skin: Negative.   Neurological: Negative.   Endo/Heme/Allergies: Negative.   Psychiatric/Behavioral: Negative.   All other systems reviewed and are negative.    Objective:   Today's Vitals: BP 122/82 (BP Location: Left Arm, Patient Position: Sitting, Cuff Size: Normal)   Pulse 82   Temp (!) 97 F (36.1 C) (Temporal)   Resp 18   Ht 5' (1.524 m)   Wt 169 lb (76.7 kg)   SpO2 99%   BMI 33.01 kg/m  Vitals with BMI 08/08/2020 07/17/2020 06/05/2020  Height 5\' 0"  5\' 0"  5\' 0"   Weight 169 lbs 158 lbs 158 lbs  BMI 33.01 30.86 30.86  Systolic 122 118  Diastolic 82 84 84  Pulse 82 - -     Physical Exam  Vitals and nursing note reviewed.  Constitutional:      Appearance: Normal appearance. She is obese.  HENT:     Head: Normocephalic and atraumatic.     Right Ear: External ear normal.     Left Ear: External ear normal.     Mouth/Throat:     Comments: Mask in place  Eyes:     General:        Right eye: No discharge.        Left eye: No discharge.     Conjunctiva/sclera: Conjunctivae normal.  Cardiovascular:     Rate and Rhythm: Normal rate and regular rhythm.     Pulses: Normal pulses.     Heart sounds: Normal heart sounds.  Pulmonary:     Effort: Pulmonary effort is normal.     Breath sounds: Normal breath sounds.  Musculoskeletal:     Cervical back:  Normal range of motion and neck supple.     Lumbar back: Tenderness present. No bony tenderness.  Skin:    General: Skin is warm.  Neurological:     General: No focal deficit present.     Mental Status: She is alert and oriented to person, place, and time.     Cranial Nerves: Cranial nerves are intact.     Sensory: Sensation is intact.     Motor: Motor function is intact.     Coordination: Coordination is intact.     Gait: Gait is intact.  Psychiatric:        Mood and Affect: Mood normal.        Behavior: Behavior normal.        Thought Content: Thought content normal.        Judgment: Judgment normal.     Assessment   1. Sciatica of right side     Tests ordered No orders of the defined types were placed in this encounter.    Plan: Please see assessment and plan per problem list above.   Meds ordered this encounter  Medications  . predniSONE (STERAPRED UNI-PAK 21 TAB) 10 MG (21) TBPK tablet    Sig: Take as directed    Dispense:  21 tablet    Refill:  0    Order Specific Question:   Supervising Provider    Answer:   SIMPSON, MARGARET E [[id number]]  . ketorolac (TORADOL) injection 60 mg  . methylPREDNISolone acetate (DEPO-MEDROL) injection 80 mg    Patient to follow-up in 3 months   Freddy Finner, NP

## 2020-08-08 NOTE — Patient Instructions (Addendum)
I appreciate the opportunity to provide you with care for your health and wellness. Today we discussed: sciatica pain   Follow up: 3 months   No labs or referrals today  Injections today for pain  Prednisone dose pack   Work note return tomorrow  Lift with knees Avoid bending the leg at night, sitting for long periods of time, and putting weight on that hip area   Please continue to practice social distancing to keep you, your family, and our community safe.  If you must go out, please wear a mask and practice good handwashing.  It was a pleasure to see you and I look forward to continuing to work together on your health and well-being. Please do not hesitate to call the office if you need care or have questions about your care.  Have a wonderful day and week. With Gratitude, Tereasa Coop, DNP, AGNP-BC

## 2020-08-08 NOTE — Assessment & Plan Note (Addendum)
Toradol, Depo-Medrol injections today in the office.  Prednisone Dosepak provided as well.  And a work note.  Encouraged to write mechanics on how to lift appropriately with knees.  Not bend for the back.

## 2020-10-03 ENCOUNTER — Telehealth: Payer: Self-pay | Admitting: Licensed Clinical Social Worker

## 2020-10-03 ENCOUNTER — Telehealth (INDEPENDENT_AMBULATORY_CARE_PROVIDER_SITE_OTHER): Payer: Self-pay | Admitting: Licensed Clinical Social Worker

## 2020-10-03 DIAGNOSIS — F321 Major depressive disorder, single episode, moderate: Secondary | ICD-10-CM

## 2020-10-03 DIAGNOSIS — F419 Anxiety disorder, unspecified: Secondary | ICD-10-CM

## 2020-10-03 NOTE — Progress Notes (Signed)
[facility] ([facility]) Psychiatric Consultant Case Review   Jacqueline [name] is a 44 y.o. year old female with a history of anxiety, hyperlipidemia.  She has worsening in depression in the context of grief of loss of her fiance in Oct 2020. She has depressive symptoms which includes appetite loss/weight loss of 12 pounds over several months. 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 She has been on fluoxetine 20 mg at least for a few months.    Assessment/Provisional Diagnosis # MDD She will benefit from uptitration of fluoxetine to optimize treatment for depression.   Recommendation  - Increase fluoxetine 40 mg daily  - BH specialist to offer supportive therapy, coach behavioral activation.  Thank you for your consult. We will continue to follow the patient. Please contact vBHI  for any questions or concerns.   The above treatment considerations and suggestions are based on consultation with the [facility] specialist and/or PCP and a review of information available in the shared registry and the patient's Electronic Health Record (EHR). I have not personally examined the patient. All recommendations should be implemented with consideration of the patient's relevant prior history and current clinical status. Please feel free to call me with any questions about the care of this patient.

## 2020-10-03 NOTE — BH Specialist Note (Signed)
Virtual Behavioral Health Treatment Plan Team Note  MRN: [id number] NAME: Jacqueline [name]  DATE: 10/03/20  Start time:  2p End time:  210p Total time:   Total number of Virtual BH Treatment Team Plan encounters: 1/4  Treatment Team Attendees: Nolon Rod, LCSW; Dr. Vanetta Shawl  Diagnoses: No diagnosis found.  Goals, Interventions and Follow-up Plan Goals: Increase healthy adjustment to current life circumstances Begin healthy grieving over loss Interventions: Solution-Focused Strategies Mindfulness or Relaxation Training Medication Management Recommendations: increase fluoxetine to 40mg  daily Follow-up Plan: weekly VBH services  History of the present illness Presenting Problem/Current Symptoms: continues to grieve the loss of her fiance.  Psychiatric History  Depression: No Anxiety: No Mania: No Psychosis: No PTSD symptoms: No  Past Psychiatric History/Hospitalization(s): Hospitalization for psychiatric illness: No Prior Suicide Attempts: No Prior Self-injurious behavior: No  Self-harm Behaviors Risk Assessment NO   Screenings PHQ-9 Assessments:  Depression screen [facility] 2/9 08/08/2020 07/17/2020 06/05/2020  Decreased Interest 0 0 2  Down, Depressed, Hopeless 0 0 2  PHQ - 2 Score 0 0 4  Altered sleeping - 1 2  Tired, decreased energy - 1 2  Change in appetite - 1 2  Feeling bad or failure about yourself  - 0 0  Trouble concentrating - 0 2  Moving slowly or fidgety/restless - 0 0  Suicidal thoughts - 0 0  PHQ-9 Score - 3 12  Difficult doing work/chores - Not difficult at all Somewhat difficult   GAD-7 Assessments:  GAD 7 : Generalized Anxiety Score 07/17/2020 06/05/2020 03/20/2020 02/21/2020  Nervous, Anxious, on Edge 1 2 0 2  Control/stop worrying 0 0 0 0  Worry too much - different things 0 1 0 1  Trouble relaxing 0 2 1 3   Restless 0 0 1 3  Easily annoyed or irritable 0 2 1 2   Afraid - awful might happen 0 0 0 1  Total GAD 7 Score 1 7 3 12   Anxiety Difficulty  Not difficult at all Somewhat difficult Somewhat difficult Somewhat difficult    Past Medical History Past Medical History:  Diagnosis Date  . Annual visit for general adult medical examination with abnormal findings 02/22/2020  . Encounter for TB tine test 02/22/2020  . GERD (gastroesophageal reflux disease)   . Kidney stones    hx UTI  . Need for immunization against influenza 12/29/2019  . Sinusitis   . URI (upper respiratory infection)   . UTI (lower urinary tract infection)     Vital signs: There were no vitals filed for this visit.  Allergies:  Allergies as of 10/03/2020 - Review Complete 08/08/2020  Allergen Reaction Noted  . Penicillins Nausea And Vomiting and Rash 11/25/2011    Medication History Current medications:  Outpatient Encounter Medications as of 10/03/2020  Medication Sig  . acetaminophen (TYLENOL) 500 MG tablet Take 500 mg by mouth every 6 (six) hours as needed.  . cetirizine (ZYRTEC) 10 MG tablet Take 10 mg by mouth daily.  12/03/2020 FLUoxetine (PROZAC) 20 MG capsule Take 1 capsule (20 mg total) by mouth daily.  . hydrOXYzine (ATARAX/VISTARIL) 10 MG tablet Take 1 tablet (10 mg total) by mouth 2 (two) times daily as needed for anxiety. (Patient not taking: Reported on 08/08/2020)  . predniSONE (STERAPRED UNI-PAK 21 TAB) 10 MG (21) TBPK tablet Take as directed  . pseudoephedrine (SUDAFED) 120 MG 12 hr tablet Take 120 mg by mouth every 12 (twelve) hours as needed for congestion.  . Vitamin D, Ergocalciferol, (DRISDOL) 1.25 MG (50000  UNIT) CAPS capsule Take 1 capsule (50,000 Units total) by mouth every 7 (seven) days.   No facility-administered encounter medications on file as of 10/03/2020.     Scribe for Treatment Team: Marinda Elk, LCSW

## 2020-10-03 NOTE — BH Specialist Note (Signed)
[facility] Initial Clinical Assessment  MRN: [id number] NAME: ANALIZA COWGER Date: 10/03/20  Start time:  1p End time:  150p Total time:  Call number:  [phone]  Type of Contact:  telephone Patient consent obtained:  yes Reason for Visit today:  begin [facility] services  Treatment History Patient recently received Inpatient Treatment:  no  Facility/Program:    Date of discharge:   Patient currently being seen by therapist/psychiatrist:   Patient currently receiving the following services:    Past Psychiatric History/Hospitalization(s): Anxiety: No Bipolar Disorder: No Depression: No Mania: No Psychosis: No Schizophrenia: No Personality Disorder: No Hospitalization for psychiatric illness: No History of Electroconvulsive Shock Therapy: No Prior Suicide Attempts: No  Clinical Assessment:  PHQ-9 Assessments: Depression screen [facility] 2/9 08/08/2020 07/17/2020 06/05/2020  Decreased Interest 0 0 2  Down, Depressed, Hopeless 0 0 2  PHQ - 2 Score 0 0 4  Altered sleeping - 1 2  Tired, decreased energy - 1 2  Change in appetite - 1 2  Feeling bad or failure about yourself  - 0 0  Trouble concentrating - 0 2  Moving slowly or fidgety/restless - 0 0  Suicidal thoughts - 0 0  PHQ-9 Score - 3 12  Difficult doing work/chores - Not difficult at all Somewhat difficult    GAD-7 Assessments: GAD 7 : Generalized Anxiety Score 07/17/2020 06/05/2020 03/20/2020 02/21/2020  Nervous, Anxious, on Edge 1 2 0 2  Control/stop worrying 0 0 0 0  Worry too much - different things 0 1 0 1  Trouble relaxing 0 2 1 3   Restless 0 0 1 3  Easily annoyed or irritable 0 2 1 2   Afraid - awful might happen 0 0 0 1  Total GAD 7 Score 1 7 3 12   Anxiety Difficulty Not difficult at all Somewhat difficult Somewhat difficult Somewhat difficult     Social Functioning Social maturity:  WNL Social judgement:  WNL  Stress Current stressors:  death of fiance, parenting, bereavement Familial  stressors:   Sleep:  poor (does not sleep 4-5 nights per week) Appetite:  poor Coping ability:overwhelmed   Patient taking medications as prescribed:  yes  Current medications:  Outpatient Encounter Medications as of 10/03/2020  Medication Sig  . acetaminophen (TYLENOL) 500 MG tablet Take 500 mg by mouth every 6 (six) hours as needed.  . cetirizine (ZYRTEC) 10 MG tablet Take 10 mg by mouth daily.  FLUoxetine (PROZAC) 20 MG capsule Take 1 capsule (20 mg total) by mouth daily.  . hydrOXYzine (ATARAX/VISTARIL) 10 MG tablet Take 1 tablet (10 mg total) by mouth 2 (two) times daily as needed for anxiety. (Patient not taking: Reported on 08/08/2020)  . predniSONE (STERAPRED UNI-PAK 21 TAB) 10 MG (21) TBPK tablet Take as directed  . pseudoephedrine (SUDAFED) 120 MG 12 hr tablet Take 120 mg by mouth every 12 (twelve) hours as needed for congestion.  . Vitamin D, Ergocalciferol, (DRISDOL) 1.25 MG (50000 UNIT) CAPS capsule Take 1 capsule (50,000 Units total) by mouth every 7 (seven) days.   No facility-administered encounter medications on file as of 10/03/2020.    Self-harm Behaviors Risk Assessment Self-harm risk factors:  n/a Patient endorses recent thoughts of harming self:    Marland Kitchen Suicide Severity Rating Scale: No flowsheet data found.  Danger to Others Risk Assessment Danger to others risk factors:  n/a Patient endorses recent thoughts of harming others:    Dynamic Appraisal of Situational Aggression (DASA): No flowsheet data found.  Substance Use Assessment Patient recently consumed alcohol:  n/a  Alcohol Use Disorder Identification Test (AUDIT): No flowsheet data found. Patient recently used drugs:    Opioid Risk Assessment:  Patient is concerned about dependence or abuse of substances:    ASAM Multidimensional Assessment Summary:  Dimension 1:    Dimension 1 Rating:    Dimension 2:    Dimension 2 Rating:    Dimension 3:    Dimension 3 Rating:    Dimension 4:    Dimension  4 Rating:    Dimension 5:    Dimension 5 Rating:    Dimension 6:    Dimension 6 Rating:   ASAM's Severity Rating Score:   ASAM Recommended Level of Treatment:     Goals, Interventions and Follow-up Plan Goals: Increase healthy adjustment to current life circumstances and Begin healthy grieving over loss Interventions: Solution-Focused Strategies and Mindfulness or Relaxation Training Follow-up Plan: weekly VBH services  Summary of Clinical Assessment Summary: Priyana is a 44 year old woman that was [facility] to [facility] by her PCP.  Makai's fiance died October 25, 2019, she continues to grieve.  Reports thinking of him daily and more intensive as the anniversary of his death is soon.  She has a 36 yr old son who she struggles to parent due to anxiety (worried, sadness, difficulty breathing, difficult with concentration). Recently completed college with Early Childhood Education Degree and attempted to work at a daycare but was exposed to COVID more than once.  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  She has a few positive relationships with friends and family.  She has medical concerns such as Sciatica.  She has difficulty with appetite, feelings of hopelessness, sadness, poor energy, poor motivation.  Discussion of VBH.  Patient is open to weekly phone calls.    Marinda Elk, LCSW

## 2020-10-30 ENCOUNTER — Other Ambulatory Visit (HOSPITAL_COMMUNITY): Payer: Self-pay | Admitting: Family Medicine

## 2020-10-30 DIAGNOSIS — R928 Other abnormal and inconclusive findings on diagnostic imaging of breast: Secondary | ICD-10-CM

## 2020-11-08 ENCOUNTER — Other Ambulatory Visit: Payer: Self-pay

## 2020-11-08 ENCOUNTER — Encounter: Payer: Self-pay | Admitting: Family Medicine

## 2020-11-08 ENCOUNTER — Telehealth (INDEPENDENT_AMBULATORY_CARE_PROVIDER_SITE_OTHER): Payer: Medicaid Other | Admitting: Family Medicine

## 2020-11-08 VITALS — Ht 60.0 in | Wt 168.0 lb

## 2020-11-08 DIAGNOSIS — F321 Major depressive disorder, single episode, moderate: Secondary | ICD-10-CM | POA: Diagnosis not present

## 2020-11-08 DIAGNOSIS — F419 Anxiety disorder, unspecified: Secondary | ICD-10-CM

## 2020-11-08 MED ORDER — BUSPIRONE HCL 7.5 MG PO TABS: 7.5000 mg | ORAL_TABLET | Freq: Two times a day (BID) | ORAL | 1 refills | Status: DC

## 2020-11-08 NOTE — Assessment & Plan Note (Signed)
Denies SI and HI. Follow-up in 2 weeks as she feels like she is just not doing very well but she thinks this is more anxiety driven than depression during. She discontinued Prozac on her own. She was very excited to have a licensed Child psychotherapist speaking with her on a regular basis but they never called her after her first visit. So she is looking to possibly get that restarted. And she is willing to start something for anxiety. Declines wanting anything for depression at this time.

## 2020-11-08 NOTE — Progress Notes (Signed)
Virtual Visit via Telephone Note   This visit type was conducted due to national recommendations for restrictions regarding the COVID-19 Pandemic (e.g. social distancing) in an effort to limit this patient's exposure and mitigate transmission in our community.  Due to her co-morbid illnesses, this patient is at least at moderate risk for complications without adequate follow up.  This format is felt to be most appropriate for this patient at this time.  The patient did not have access to video technology/had technical difficulties with video requiring transitioning to audio format only (telephone).  All issues noted in this document were discussed and addressed.  No physical exam could be performed with this format.    Evaluation Performed:  Follow-up visit  Date:  11/08/2020   ID:  Jacqueline [name], DOB 01/29/1976, MRN [id number]  Patient Location: Home Provider Location: Office/Clinic  Location of Patient: Home Location of Provider: Telehealth Consent was obtain for visit to be over via telehealth. I verified that I am speaking with the correct person using two identifiers.  PCP:  Freddy Finner, NP   Chief Complaint: Mood  History of Present Illness:    Jacqueline [name] is a 44 y.o. female with history as stated below. Is here for follow-up regarding anxiety and depression. Overall she reports that she was feeling pretty well. Was excited to have communication with Child psychotherapist. However she never returned her call after the first visit. She stopped taking the Prozac on her own did not think that it was doing much for her as she just continued to have anxiety. She is antsy all the time. She is unable to relax or stay in a relaxed day after working. She has had to leave work a few times secondary to stress related to the state of her significant other whom she was not married to but does have a child and lives with for many years. She reports that she thinks is more anxiety than depression.  Is willing to start BuSpar for anxiety. Does not want anything for depression. But would like to get back into talking to a Child psychotherapist.  The patient does not have symptoms concerning for COVID-19 infection (fever, chills, cough, or new shortness of breath).   Past Medical, Surgical, Social History, Allergies, and Medications have been Reviewed.  Past Medical History:  Diagnosis Date  . Annual visit for general adult medical examination with abnormal findings 02/22/2020  . Encounter for TB tine test 02/22/2020  . GERD (gastroesophageal reflux disease)   . Kidney stones    hx UTI  . Need for immunization against influenza 12/29/2019  . Sinusitis   . URI (upper respiratory infection)   . UTI (lower urinary tract infection)    Past Surgical History:  Procedure Laterality Date  . DILATION AND CURETTAGE OF UTERUS    . TUBAL LIGATION       Current Meds  Medication Sig  . acetaminophen (TYLENOL) 500 MG tablet Take 500 mg by mouth every 6 (six) hours as needed.  . cetirizine (ZYRTEC) 10 MG tablet Take 10 mg by mouth daily.  . pseudoephedrine (SUDAFED) 120 MG 12 hr tablet Take 120 mg by mouth every 12 (twelve) hours as needed for congestion.     Allergies:   Penicillins   ROS:   Please see the history of present illness.    All other systems reviewed and are negative.   Labs/Other Tests and Data Reviewed:    Recent Labs: 02/23/2020: ALT 14;  BUN 8; Creat 0.59; Hemoglobin 14.5; Platelets 372; Potassium 4.2; Sodium 139; TSH 1.36   Recent Lipid Panel Lab Results  Component Value Date/Time   CHOL 189 02/23/2020 08:03 AM   TRIG 66 02/23/2020 08:03 AM   HDL 45 (L) 02/23/2020 08:03 AM   CHOLHDL 4.2 02/23/2020 08:03 AM   LDLCALC 128 (H) 02/23/2020 08:03 AM    Wt Readings from Last 3 Encounters:  11/08/20 168 lb (76.2 kg)  08/08/20 169 lb (76.7 kg)  07/17/20 158 lb (71.7 kg)     Objective:    Vital Signs:  Ht 5' (1.524 m)   Wt 168 lb (76.2 kg)   LMP 10/18/2020   BMI 32.81  kg/m    VITAL SIGNS:  reviewed GEN:  no acute distress RESPIRATORY:  no shortness of breath in conversation  PSYCH:  normal affect   ASSESSMENT & PLAN:    1. Anxiety - busPIRone (BUSPAR) 7.5 MG tablet; Take 1 tablet (7.5 mg total) by mouth 2 (two) times daily.  Dispense: 60 tablet; Refill: 1  2. Depression, major, single episode, moderate (HCC)   Time:   Today, I have spent 7 minutes with the patient with telehealth technology discussing the above problems.     Medication Adjustments/Labs and Tests Ordered: Current medicines are reviewed at length with the patient today.  Concerns regarding medicines are outlined above.   Tests Ordered: No orders of the defined types were placed in this encounter.   Medication Changes: No orders of the defined types were placed in this encounter.    Note: This dictation was prepared with Dragon dictation along with smaller phrase technology. Similar sounding words can be transcribed inadequately or may not be corrected upon review. Any transcriptional errors that result from this process are unintentional.      Disposition:  Follow up 2 weeks  Signed, Freddy Finner, NP  11/08/2020 4:19 PM     [facility]

## 2020-11-08 NOTE — Patient Instructions (Signed)
°  HAPPY FALL!  I appreciate the opportunity to provide you with care for your health and wellness. Today we discussed: anxiety   Follow up: 2 weeks by phone   No labs or referrals today  Start taking buspar as we discussed. Call if you have questions.  Please continue to practice social distancing to keep you, your family, and our community safe.  If you must go out, please wear a mask and practice good handwashing.  It was a pleasure to see you and I look forward to continuing to work together on your health and well-being. Please do not hesitate to call the office if you need care or have questions about your care.  Have a wonderful day and week. With Gratitude, Tereasa Coop, DNP, AGNP-BC

## 2020-11-08 NOTE — Assessment & Plan Note (Signed)
Denies SI and HI. Follow-up in 2 weeks as she feels like she is just not doing very well but she thinks this is more anxiety driven than depression during. She discontinued Prozac on her own. She was very excited to have a licensed social worker speaking with her on a regular basis but they never called her after her first visit. So she is looking to possibly get that restarted. And she is willing to start something for anxiety. Declines wanting anything for depression at this time. 

## 2020-11-15 ENCOUNTER — Ambulatory Visit (HOSPITAL_COMMUNITY): Payer: Medicaid Other

## 2020-11-15 ENCOUNTER — Encounter (HOSPITAL_COMMUNITY): Payer: Medicaid Other

## 2021-04-24 ENCOUNTER — Encounter: Payer: Self-pay | Admitting: Internal Medicine

## 2021-04-24 ENCOUNTER — Other Ambulatory Visit: Payer: Self-pay

## 2021-04-24 ENCOUNTER — Ambulatory Visit (INDEPENDENT_AMBULATORY_CARE_PROVIDER_SITE_OTHER): Payer: Medicaid Other | Admitting: Internal Medicine

## 2021-04-24 DIAGNOSIS — F321 Major depressive disorder, single episode, moderate: Secondary | ICD-10-CM | POA: Diagnosis not present

## 2021-04-24 DIAGNOSIS — F419 Anxiety disorder, unspecified: Secondary | ICD-10-CM

## 2021-04-24 NOTE — Progress Notes (Signed)
Virtual Visit via Telephone Note   This visit type was conducted due to national recommendations for restrictions regarding the COVID-19 Pandemic (e.g. social distancing) in an effort to limit this patient's exposure and mitigate transmission in our community.  Due to her co-morbid illnesses, this patient is at least at moderate risk for complications without adequate follow up.  This format is felt to be most appropriate for this patient at this time.  The patient did not have access to video technology/had technical difficulties with video requiring transitioning to audio format only (telephone).  All issues noted in this document were discussed and addressed.  No physical exam could be performed with this format.  Please refer to the patient's chart for her  consent to telehealth for [facility].   Evaluation Performed:  Follow-up visit  Date:  04/24/2021   ID:  Jacqueline [name], DOB 04-02-1976, MRN [id number]  Patient Location: Home Provider Location: Office/Clinic  Location of Patient: Home Location of Provider: Telehealth Consent was obtain for visit to be over via telehealth. I verified that I am speaking with the correct person using two identifiers.  PCP:  Freddy Finner, NP   Chief Complaint:  Depression  History of Present Illness:    Jacqueline [name] is a 45 y.o. female who has a televisit for c/o depressed mood, which is worse lately. She has been having anhedonia and spells of anxiety since she lost her fiance. She has tried multiple medications in the  Past, including Prozac and Buspar. She has sleep difficulty and decreased interest in her activities that she used to like in the past. Denies SI or HI.  The patient does not have symptoms concerning for COVID-19 infection (fever, chills, cough, or new shortness of breath).   Past Medical, Surgical, Social History, Allergies, and Medications have been Reviewed.  Past Medical History:  Diagnosis Date  . Annual visit for  general adult medical examination with abnormal findings 02/22/2020  . Encounter for TB tine test 02/22/2020  . GERD (gastroesophageal reflux disease)   . Kidney stones    hx UTI  . Need for immunization against influenza 12/29/2019  . Sinusitis   . URI (upper respiratory infection)   . UTI (lower urinary tract infection)    Past Surgical History:  Procedure Laterality Date  . DILATION AND CURETTAGE OF UTERUS    . TUBAL LIGATION       Current Meds  Medication Sig  . acetaminophen (TYLENOL) 500 MG tablet Take 500 mg by mouth every 6 (six) hours as needed.  . busPIRone (BUSPAR) 7.5 MG tablet Take 1 tablet (7.5 mg total) by mouth 2 (two) times daily.  . cetirizine (ZYRTEC) 10 MG tablet Take 10 mg by mouth daily.  . pseudoephedrine (SUDAFED) 120 MG 12 hr tablet Take 120 mg by mouth every 12 (twelve) hours as needed for congestion.     Allergies:   Penicillins   ROS:   Please see the history of present illness.     All other systems reviewed and are negative.   Labs/Other Tests and Data Reviewed:    Recent Labs: No results found for requested labs within last 8760 hours.   Recent Lipid Panel Lab Results  Component Value Date/Time   CHOL 189 02/23/2020 08:03 AM   TRIG 66 02/23/2020 08:03 AM   HDL 45 (L) 02/23/2020 08:03 AM   CHOLHDL 4.2 02/23/2020 08:03 AM   LDLCALC 128 (H) 02/23/2020 08:03 AM    Wt  Readings from Last 3 Encounters:  11/08/20 168 lb (76.2 kg)  08/08/20 169 lb (76.7 kg)  07/17/20 158 lb (71.7 kg)      ASSESSMENT & PLAN:    Depression with anxiety Has tried SSRI and Buspar in the past, did not get much help Referred to [facility] therapist She has had therapy session in the past, agrees to see therapist again.  Time:   Today, I have spent 9 minutes reviewing the chart, including problem list, medications, and with the patient with telehealth technology discussing the above problems.   Medication Adjustments/Labs and Tests Ordered: Current medicines are  reviewed at length with the patient today.  Concerns regarding medicines are outlined above.   Tests Ordered: Orders Placed This Encounter  Procedures  . Ambulatory referral to Psychiatry    Medication Changes: No orders of the defined types were placed in this encounter.    Note: This dictation was prepared with Dragon dictation along with smaller phrase technology. Similar sounding words can be transcribed inadequately or may not be corrected upon review. Any transcriptional errors that result from this process are unintentional.      Disposition:  Follow up  Signed, Anabel Halon, MD  04/24/2021 4:26 PM     [facility]

## 2021-04-24 NOTE — Patient Instructions (Addendum)
You are being referred to Behavioral health therapist for counseling.   http://APA.org/depression-guideline"> https://clinicalkey.com"> http://point-of-care.elsevierperformancemanager.com/skills/"> http://point-of-care.elsevierperformancemanager.com">  Managing Depression, Adult Depression is a mental health condition that affects your thoughts, feelings, and actions. Being diagnosed with depression can bring you relief if you did not know why you have felt or behaved a certain way. It could also leave you feeling overwhelmed with uncertainty about your future. Preparing yourself to manage your symptoms can help you feel more positive about your future. How to manage lifestyle changes Managing stress Stress is your body's reaction to life changes and events, both good and bad. Stress can add to your feelings of depression. Learning to manage your stress can help lessen your feelings of depression. Try some of the following approaches to reducing your stress (stress reduction techniques):  Listen to music that you enjoy and that inspires you.  Try using a meditation app or take a meditation class.  Develop a practice that helps you connect with your spiritual self. Walk in nature, pray, or go to a place of worship.  Do some deep breathing. To do this, inhale slowly through your nose. Pause at the top of your inhale for a few seconds and then exhale slowly, letting your muscles relax.  Practice yoga to help relax and work your muscles. Choose a stress reduction technique that suits your lifestyle and personality. These techniques take time and practice to develop. Set aside 5-15 minutes a day to do them. Therapists can offer training in these techniques. Other things you can do to manage stress include:  Keeping a stress diary.  Knowing your limits and saying no when you think something is too much.  Paying attention to how you react to certain situations. You may not be able to control  everything, but you can change your reaction.  Adding humor to your life by watching funny films or TV shows.  Making time for activities that you enjoy and that relax you.   Medicines Medicines, such as antidepressants, are often a part of treatment for depression.  Talk with your pharmacist or health care provider about all the medicines, supplements, and herbal products that you take, their possible side effects, and what medicines and other products are safe to take together.  Make sure to report any side effects you may have to your health care provider. Relationships Your health care provider may suggest family therapy, couples therapy, or individual therapy as part of your treatment. How to recognize changes Everyone responds differently to treatment for depression. As you recover from depression, you may start to:  Have more interest in doing activities.  Feel less hopeless.  Have more energy.  Overeat less often, or have a better appetite.  Have better mental focus. It is important to recognize if your depression is not getting better or is getting worse. The symptoms you had in the beginning may return, such as:  Tiredness (fatigue) or low energy.  Eating too much or too little.  Sleeping too much or too little.  Feeling restless, agitated, or hopeless.  Trouble focusing or making decisions.  Unexplained physical complaints.  Feeling irritable, angry, or aggressive. If you or your family members notice these symptoms coming back, let your health care provider know right away. Follow these instructions at home: Activity  Try to get some form of exercise each day, such as walking, biking, swimming, or lifting weights.  Practice stress reduction techniques.  Engage your mind by taking a class or doing some volunteer work.  Lifestyle  Get the right amount and quality of sleep.  Cut down on using caffeine, tobacco, alcohol, and other potentially harmful  substances.  Eat a healthy diet that includes plenty of vegetables, fruits, whole grains, low-fat dairy products, and lean protein. Do not eat a lot of foods that are high in solid fats, added sugars, or salt (sodium). General instructions  Take over-the-counter and prescription medicines only as told by your health care provider.  Keep all follow-up visits as told by your health care provider. This is important. Where to find support Talking to others Friends and family members can be sources of support and guidance. Talk to trusted friends or family members about your condition. Explain your symptoms to them, and let them know that you are working with a health care provider to treat your depression. Tell friends and family members how they also can be helpful.   Finances  Find appropriate mental health providers that fit with your financial situation.  Talk with your health care provider about options to get reduced prices on your medicines. Where to find more information You can find support in your area from:  Anxiety and Depression Association of America (ADAA): www.adaa.org  Mental Health America: www.mentalhealthamerica.net  [employer] on Mental Illness: www.nami.org Contact a health care provider if:  You stop taking your antidepressant medicines, and you have any of these symptoms: ? Nausea. ? Headache. ? Light-headedness. ? Chills and body aches. ? Not being able to sleep (insomnia).  You or your friends and family think your depression is getting worse. Get help right away if:  You have thoughts of hurting yourself or others. If you ever feel like you may hurt yourself or others, or have thoughts about taking your own life, get help right away. Go to your nearest emergency department or:  Call your local emergency services (911 in the [place]  Call a suicide crisis helpline, such as the National Suicide Prevention Lifeline at (629) 603-3815. This is open 24  hours a day in the [place]  Text the Crisis Text Line at (779)377-1796 (in the [place] Summary  If you are diagnosed with depression, preparing yourself to manage your symptoms is a good way to feel positive about your future.  Work with your health care provider on a management plan that includes stress reduction techniques, medicines (if applicable), therapy, and healthy lifestyle habits.  Keep talking with your health care provider about how your treatment is working.  If you have thoughts about taking your own life, call a suicide crisis helpline or text a crisis text line. This information is not intended to replace advice given to you by your health care provider. Make sure you discuss any questions you have with your health care provider. Document Revised: 10/26/2019 Document Reviewed: 10/26/2019 Elsevier Patient Education  2021 [employer].

## 2021-05-01 ENCOUNTER — Other Ambulatory Visit: Payer: Self-pay

## 2021-05-01 ENCOUNTER — Ambulatory Visit: Payer: Medicaid Other

## 2021-05-01 ENCOUNTER — Telehealth: Payer: Self-pay | Admitting: Licensed Clinical Social Worker

## 2021-05-01 DIAGNOSIS — F321 Major depressive disorder, single episode, moderate: Secondary | ICD-10-CM

## 2021-05-01 DIAGNOSIS — F419 Anxiety disorder, unspecified: Secondary | ICD-10-CM

## 2021-05-01 NOTE — BH Specialist Note (Signed)
[place] [facility] Initial Clinical Assessment  MRN: [id number] NAME: Jacqueline [name] Date: 05/01/21  Start time:  12p End time:  1230p Total time:  Call number:  [phone]  Type of Contact:  telephone Patient consent obtained:  yes Reason for Visit today:  re-start [facility] services  Treatment History Patient recently received Inpatient Treatment:    Facility/Program:    Date of discharge:   Patient currently being seen by therapist/psychiatrist:   Patient currently receiving the following services:    Past Psychiatric History/Hospitalization(s): Anxiety: No Bipolar Disorder: No Depression: Yes Mania: No Psychosis: No Schizophrenia: No Personality Disorder: No Hospitalization for psychiatric illness: No History of Electroconvulsive Shock Therapy: No Prior Suicide Attempts: No  Clinical Assessment:  PHQ-9 Assessments: Depression screen [facility] 2/9 04/24/2021 11/08/2020 08/08/2020  Decreased Interest 1 0 0  Down, Depressed, Hopeless 0 0 0  PHQ - 2 Score 1 0 0  Altered sleeping 3 - -  Tired, decreased energy 3 - -  Change in appetite 2 - -  Feeling bad or failure about yourself  1 - -  Trouble concentrating 2 - -  Moving slowly or fidgety/restless 1 - -  Suicidal thoughts 0 - -  PHQ-9 Score 13 - -  Difficult doing work/chores Very difficult - -  Some recent data might be hidden    GAD-7 Assessments: GAD 7 : Generalized Anxiety Score 04/24/2021 07/17/2020 06/05/2020 03/20/2020  Nervous, Anxious, on Edge 3 1 2  0  Control/stop worrying 3 0 0 0  Worry too much - different things 3 0 1 0  Trouble relaxing 1 0 2 1  Restless 2 0 0 1  Easily annoyed or irritable 1 0 2 1  Afraid - awful might happen 3 0 0 0  Total GAD 7 Score 16 1 7 3   Anxiety Difficulty Extremely difficult Not difficult at all Somewhat difficult Somewhat difficult     Social Functioning Social maturity:  WNL Social judgement:  WNL  Stress Current stressors:  grief Familial stressors:   none Sleep:  only sleeps 2-4 hours per night Appetite:   Coping ability:   Patient taking medications as prescribed:    Current medications:  Outpatient Encounter Medications as of 05/01/2021  Medication Sig  . acetaminophen (TYLENOL) 500 MG tablet Take 500 mg by mouth every 6 (six) hours as needed.  . busPIRone (BUSPAR) 7.5 MG tablet Take 1 tablet (7.5 mg total) by mouth 2 (two) times daily.  . cetirizine (ZYRTEC) 10 MG tablet Take 10 mg by mouth daily.  . pseudoephedrine (SUDAFED) 120 MG 12 hr tablet Take 120 mg by mouth every 12 (twelve) hours as needed for congestion.   No facility-administered encounter medications on file as of 05/01/2021.    Self-harm Behaviors Risk Assessment Self-harm risk factors:   Patient endorses recent thoughts of harming self:    07/01/2021 Suicide Severity Rating Scale: No flowsheet data found.  Danger to Others Risk Assessment Danger to others risk factors:   Patient endorses recent thoughts of harming others:    Dynamic Appraisal of Situational Aggression (DASA): No flowsheet data found.  Substance Use Assessment Patient recently consumed alcohol:    Alcohol Use Disorder Identification Test (AUDIT): No flowsheet data found. Patient recently used drugs:    Opioid Risk Assessment:  Patient is concerned about dependence or abuse of substances:    ASAM Multidimensional Assessment Summary:  Dimension 1:    Dimension 1 Rating:    Dimension 2:    Dimension 2 Rating:  Dimension 3:    Dimension 3 Rating:    Dimension 4:    Dimension 4 Rating:    Dimension 5:    Dimension 5 Rating:    Dimension 6:    Dimension 6 Rating:   ASAM's Severity Rating Score:   ASAM Recommended Level of Treatment:     Goals, Interventions and Follow-up Plan Goals: Increase healthy adjustment to current life circumstances and Begin healthy grieving over loss Interventions: Motivational Interviewing, Mindfulness or Management consultant and CBT Cognitive Behavioral  Therapy Follow-up Plan: biweekly [facility] services  Summary of Clinical Assessment Summary: Jacqueline [name] is a 45 yr old woman that was referred by her PCP.  Her fiance died in 2019/04/26 and she continues to grieve his loss. Her fiance died of a massive heart attack. She has a 76 yr old son that she has a good relationship.  She reports 2-4 hours per night and has "brain fog" throughout the day.  She has vivid dream of him. She has tried various medication such as: Cymbalta, Prozac; Buspar.  She reports that her anxiety was increase and she discontinued medication. She was tearful throughout the interview.  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    Marinda Elk, LCSW

## 2021-05-01 NOTE — BH Specialist Note (Signed)
Virtual Behavioral Health Treatment Plan Team Note  MRN: [id number] NAME: Jacqueline [name]  DATE: 05/01/21  Start time:  405p End time:  410p Total time:  5 min  Total number of Virtual BH Treatment Team Plan encounters: 1/4  Treatment Team Attendees: Nolon Rod, LCSW; Dr. Vanetta Shawl, Psychiatrist  Diagnoses: No diagnosis found.  Goals, Interventions and Follow-up Plan Goals: Increase healthy adjustment to current life circumstances Begin healthy grieving over loss Interventions: Motivational Interviewing Mindfulness or Financial planner Therapy Medication Management Recommendations: consider starting medication management Mirtazapine 7.5mg  at night for 1 week then 15mg  at night Follow-up Plan: biweekly VBH services  History of the present illness Presenting Problem/Current Symptoms: depressive and anxiety symptoms Loss of fiance in 2020  Psychiatric History  Depression: No Anxiety: No Mania: No Psychosis: No PTSD symptoms: No  Past Psychiatric History/Hospitalization(s): Hospitalization for psychiatric illness: No Prior Suicide Attempts: No Prior Self-injurious behavior: No  Psychosocial stressors overwhelmed with daily life  Self-harm Behaviors Risk Assessment   Screenings PHQ-9 Assessments:  Depression screen [facility] 2/9 04/24/2021 11/08/2020 08/08/2020  Decreased Interest 1 0 0  Down, Depressed, Hopeless 0 0 0  PHQ - 2 Score 1 0 0  Altered sleeping 3 - -  Tired, decreased energy 3 - -  Change in appetite 2 - -  Feeling bad or failure about yourself  1 - -  Trouble concentrating 2 - -  Moving slowly or fidgety/restless 1 - -  Suicidal thoughts 0 - -  PHQ-9 Score 13 - -  Difficult doing work/chores Very difficult - -  Some recent data might be hidden   GAD-7 Assessments:  GAD 7 : Generalized Anxiety Score 04/24/2021 07/17/2020 06/05/2020 03/20/2020  Nervous, Anxious, on Edge 3 1 2  0  Control/stop worrying 3 0 0 0  Worry too much -  different things 3 0 1 0  Trouble relaxing 1 0 2 1  Restless 2 0 0 1  Easily annoyed or irritable 1 0 2 1  Afraid - awful might happen 3 0 0 0  Total GAD 7 Score 16 1 7 3   Anxiety Difficulty Extremely difficult Not difficult at all Somewhat difficult Somewhat difficult    Past Medical History Past Medical History:  Diagnosis Date  . Annual visit for general adult medical examination with abnormal findings 02/22/2020  . Encounter for TB tine test 02/22/2020  . GERD (gastroesophageal reflux disease)   . Kidney stones    hx UTI  . Need for immunization against influenza 12/29/2019  . Sinusitis   . URI (upper respiratory infection)   . UTI (lower urinary tract infection)     Vital signs: There were no vitals filed for this visit.  Allergies:  Allergies as of 05/01/2021 - Review Complete 04/24/2021  Allergen Reaction Noted  . Penicillins Nausea And Vomiting and Rash 11/25/2011    Medication History Current medications:  Outpatient Encounter Medications as of 05/01/2021  Medication Sig  . acetaminophen (TYLENOL) 500 MG tablet Take 500 mg by mouth every 6 (six) hours as needed.  . busPIRone (BUSPAR) 7.5 MG tablet Take 1 tablet (7.5 mg total) by mouth 2 (two) times daily.  . cetirizine (ZYRTEC) 10 MG tablet Take 10 mg by mouth daily.  . pseudoephedrine (SUDAFED) 120 MG 12 hr tablet Take 120 mg by mouth every 12 (twelve) hours as needed for congestion.   No facility-administered encounter medications on file as of 05/01/2021.     Scribe for Treatment Team: 11/27/2011, LCSW

## 2021-05-08 NOTE — Progress Notes (Signed)
[facility] ([facility]) Psychiatric Consultant Case Review   Jacqueline [name] is a 45 y.o. year old female with a history of anxiety, hyperlipidemia. Referred again for worsening in depression in the context of grief of loss of her fiance in Oct 2020. She has depressive symptoms which includes appetite loss/weight loss. 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 Past medication includes fluoxetine, duloxetine, buspar.   Assessment/Provisional Diagnosis # MDD Consider starting mirtazapine to address both depression, appetite loss.   Recommendation -Start mirtazapine 7.5 mg at night for 1 week, then 50 mg at night -Be a specialist to offer supportive therapy  Thank you for your consult. We will continue to follow the patient. Please contact [facility]  for any questions or concerns.   The above treatment considerations and suggestions are based on consultation with the [facility] specialist and/or PCP and a review of information available in the shared registry and the patient's Electronic Health Record (EHR). I have not personally examined the patient. All recommendations should be implemented with consideration of the patient's relevant prior history and current clinical status. Please feel free to call me with any questions about the care of this patient.

## 2021-05-09 ENCOUNTER — Encounter: Payer: Self-pay | Admitting: Nurse Practitioner

## 2021-05-09 ENCOUNTER — Other Ambulatory Visit: Payer: Self-pay

## 2021-05-09 ENCOUNTER — Ambulatory Visit (INDEPENDENT_AMBULATORY_CARE_PROVIDER_SITE_OTHER): Payer: Medicaid Other | Admitting: Nurse Practitioner

## 2021-05-09 VITALS — BP 142/93 | HR 85 | Temp 98.3°F | Ht 60.0 in | Wt 169.0 lb

## 2021-05-09 DIAGNOSIS — IMO0001 Reserved for inherently not codable concepts without codable children: Secondary | ICD-10-CM | POA: Insufficient documentation

## 2021-05-09 DIAGNOSIS — F529 Unspecified sexual dysfunction not due to a substance or known physiological condition: Secondary | ICD-10-CM | POA: Diagnosis not present

## 2021-05-09 DIAGNOSIS — Z1159 Encounter for screening for other viral diseases: Secondary | ICD-10-CM | POA: Diagnosis not present

## 2021-05-09 NOTE — Progress Notes (Signed)
Acute Office Visit  Subjective:    Patient ID: Jacqueline [name], female    DOB: 18-Jan-1976, 45 y.o.   MRN: [id number]  Chief Complaint  Patient presents with  . Exposure to STD    Wants to be tested, no symptoms and no known exposure.    HPI Patient is in today for STD testing. She has a new sexual partner, and she would like STD testing.  She would like help for grieving and she was supposed to see the counselor, but she never received a phone call.  Past Medical History:  Diagnosis Date  . Annual visit for general adult medical examination with abnormal findings 02/22/2020  . Encounter for TB tine test 02/22/2020  . GERD (gastroesophageal reflux disease)   . Kidney stones    hx UTI  . Need for immunization against influenza 12/29/2019  . Sinusitis   . URI (upper respiratory infection)   . UTI (lower urinary tract infection)     Past Surgical History:  Procedure Laterality Date  . DILATION AND CURETTAGE OF UTERUS    . TUBAL LIGATION      Family History  Problem Relation Age of Onset  . Diabetes Other   . Heart disease Mother   . Diabetes Maternal Aunt   . Cancer Paternal Aunt        lymph node, breast, and ovarian  . Breast cancer Paternal Aunt   . Diabetes Maternal Grandmother   . Heart disease Maternal Grandmother   . Heart failure Maternal Grandmother   . Heart attack Maternal Grandfather   . Cancer Paternal Grandmother        breast cancer  . Breast cancer Paternal Grandmother   . Cancer Paternal Grandfather        lung cancer    Social History   Socioeconomic History  . Marital status: Single    Spouse name: Not on file  . Number of children: 1  . Years of education: Not on file  . Highest education level: GED or equivalent  Occupational History  . Not on file  Tobacco Use  . Smoking status: Current Every Day Smoker    Packs/day: 0.50    Years: 5.00    Pack years: 2.50    Types: Cigarettes    Last attempt to quit: 05/08/2010    Years since  quitting: 11.0  . Smokeless tobacco: Never Used  Vaping Use  . Vaping Use: Never used  Substance and Sexual Activity  . Alcohol use: No  . Drug use: No  . Sexual activity: Not Currently    Birth control/protection: Surgical  Other Topics Concern  . Not on file  Social History Narrative   Lives with son Jacqueline [name] is 5 years old.   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 acetaminophen (TYLENOL) 500 MG tablet Take 500 mg by mouth every 6 (six) hours as needed.    . pseudoephedrine (SUDAFED) 120 MG 12 hr tablet Take 120 mg by mouth every 12 (twelve) hours as needed for congestion.    . busPIRone (BUSPAR) 7.5 MG tablet Take 1 tablet (7.5 mg total) by mouth 2 (two) times daily. 60 tablet 1  . cetirizine (ZYRTEC) 10 MG tablet Take 10 mg by mouth daily.     No facility-administered medications prior to visit.    Allergies  Allergen Reactions  . Penicillins Nausea And Vomiting and Rash    Review of Systems  Constitutional: Negative.   Respiratory: Negative.   Cardiovascular: Negative.   Genitourinary: Negative.   Psychiatric/Behavioral: Negative.        Objective:    Physical Exam Constitutional:      Appearance: Normal appearance.  Cardiovascular:     Rate and Rhythm: Normal rate and regular rhythm.     Pulses: Normal pulses.     Heart sounds: Normal heart sounds.   Pulmonary:     Effort: Pulmonary effort is normal.     Breath sounds: Normal breath sounds.  Neurological:     Mental Status: She is alert.  Psychiatric:     Comments: tearful     BP (!) 142/93 (BP Location: Right Arm, Patient Position: Sitting, Cuff Size: Normal)   Pulse 85   Temp 98.3 F (36.8 C) (Temporal)   Ht 5' (1.524 m)   Wt 169 lb (76.7 kg)   SpO2 97%   BMI 33.01 kg/m  Wt Readings from Last 3 Encounters:  05/09/21 169 lb (76.7 kg)  11/08/20 168 lb (76.2 kg)  08/08/20 169 lb (76.7 kg)    Health Maintenance Due  Topic Date Due  . HIV Screening  Never done  . Hepatitis C Screening  Never done    There are no preventive care reminders to display for this patient.   Lab Results  Component Value Date   TSH 1.36 02/23/2020   Lab Results  Component Value Date   WBC 8.6 02/23/2020   HGB 14.5 02/23/2020   HCT 42.1 02/23/2020   MCV 83.7 02/23/2020   PLT 372 02/23/2020   Lab Results  Component Value Date   NA 139 02/23/2020   K 4.2 02/23/2020   CO2 26 02/23/2020   GLUCOSE 85 02/23/2020   BUN 8 02/23/2020   CREATININE 0.59 02/23/2020   BILITOT 0.7 02/23/2020   ALKPHOS 56 09/19/2019   AST 14 02/23/2020   ALT 14 02/23/2020   PROT 6.8 02/23/2020   ALBUMIN 4.2 09/19/2019   CALCIUM 9.2 02/23/2020   ANIONGAP 6 09/19/2019   Lab Results  Component Value Date   CHOL 189 02/23/2020   Lab Results  Component Value Date   HDL 45 (L) 02/23/2020   Lab Results  Component Value Date   LDLCALC 128 (H) 02/23/2020   Lab Results  Component Value Date   TRIG 66 02/23/2020   Lab Results  Component Value Date   CHOLHDL 4.2 02/23/2020   Lab Results  Component Value Date   HGBA1C 5.3 02/23/2020       Assessment & Plan:   Problem List Items Addressed This Visit      Other   Sexuality issues - Primary    -has new sexual partner and is feeling very guilty -she needs to get back in with counselor to  discuss grieving; had fiance die 2 years ago and is  tearful when disclosing that she has a new sexual partner -asking for STD testing, and we will honor that request      Relevant Orders   Chlamydia/Gonococcus/Trichomonas, NAA   RPR   HIV Antibody (routine testing w rflx)   Hepatitis C antibody   HSV(herpes simplex vrs) 1+2 ab-IgG       No orders of the defined types were placed in this encounter.    Heather Roberts, NP

## 2021-05-09 NOTE — Assessment & Plan Note (Signed)
-  has new sexual partner and is feeling very guilty -she needs to get back in with counselor to discuss grieving; had fiance die 2 years ago and is tearful when disclosing that she has a new sexual partner -asking for STD testing, and we will honor that request

## 2021-05-10 ENCOUNTER — Telehealth: Payer: Self-pay

## 2021-05-10 NOTE — Telephone Encounter (Signed)
Please call the pt to go over lab results

## 2021-05-13 ENCOUNTER — Telehealth: Payer: Self-pay

## 2021-05-13 LAB — CHLAMYDIA/GONOCOCCUS/TRICHOMONAS, NAA
Chlamydia by NAA: NEGATIVE
Gonococcus by NAA: NEGATIVE
Trich vag by NAA: NEGATIVE

## 2021-05-13 NOTE — Telephone Encounter (Signed)
Pt informed

## 2021-05-13 NOTE — Telephone Encounter (Signed)
Patient called about lab results. Patient call back # [phone].

## 2021-05-13 NOTE — Telephone Encounter (Signed)
I spoke with pt on Friday and advised of the labs that were back at the time.

## 2021-05-14 LAB — HIV ANTIBODY (ROUTINE TESTING W REFLEX): HIV Screen 4th Generation wRfx: NONREACTIVE

## 2021-05-14 LAB — HSV-2 IGG SUPPLEMENTAL TEST: HSV-2 IgG Supplemental Test: POSITIVE — AB

## 2021-05-14 LAB — RPR: RPR Ser Ql: NONREACTIVE

## 2021-05-14 LAB — HSV(HERPES SIMPLEX VRS) I + II AB-IGG
HSV 1 Glycoprotein G Ab, IgG: <0.91 index (ref 0.00–0.90)
HSV 2 IgG, Type Spec: 3.41 index — ABNORMAL HIGH (ref 0.00–0.90)

## 2021-05-14 LAB — HEPATITIS C ANTIBODY: Hep C Virus Ab: <0.1 s/co ratio (ref 0.0–0.9)

## 2021-05-20 NOTE — Progress Notes (Signed)
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 If she is not having blisters currently, then she may have had this for a long time, and it has been dormant.  If she is having an outbreak, then I can send in medicine to help treat it.

## 2021-05-28 ENCOUNTER — Telehealth (INDEPENDENT_AMBULATORY_CARE_PROVIDER_SITE_OTHER): Payer: Medicaid Other | Admitting: Nurse Practitioner

## 2021-05-28 ENCOUNTER — Encounter: Payer: Self-pay | Admitting: Nurse Practitioner

## 2021-05-28 ENCOUNTER — Other Ambulatory Visit: Payer: Self-pay

## 2021-05-28 DIAGNOSIS — J069 Acute upper respiratory infection, unspecified: Secondary | ICD-10-CM | POA: Insufficient documentation

## 2021-05-28 NOTE — Assessment & Plan Note (Signed)
-  likely viral, so she will get COVID testing -she will use OTCs to treat symptoms -if no improvement by Thurs/Fri, will consider abx (She has PCN allergy

## 2021-05-28 NOTE — Progress Notes (Signed)
Acute Office Visit  Subjective:    Patient ID: Jacqueline [name], female    DOB: 01/27/1976, 45 y.o.   MRN: [id number]  Chief Complaint  Patient presents with  . Sinus Problem    Stuffy nose head stopped up pressure behind eyes sore throat has been going on since 05-27-21 pt has not had covid test has tried daytime severe cold and flu ears also hurting     HPI Patient is in today for sick visit. Symptoms started yesterday. She is having sinus pressure, nasal congestion, body aches and sore throat. She has tried OTC cold/flu medicine without relief. Has not had COVID testing.  Past Medical History:  Diagnosis Date  . Annual visit for general adult medical examination with abnormal findings 02/22/2020  . Encounter for TB tine test 02/22/2020  . GERD (gastroesophageal reflux disease)   . Kidney stones    hx UTI  . Need for immunization against influenza 12/29/2019  . Sinusitis   . URI (upper respiratory infection)   . UTI (lower urinary tract infection)     Past Surgical History:  Procedure Laterality Date  . DILATION AND CURETTAGE OF UTERUS    . TUBAL LIGATION      Family History  Problem Relation Age of Onset  . Diabetes Other   . Heart disease Mother   . Diabetes Maternal Aunt   . Cancer Paternal Aunt        lymph node, breast, and ovarian  . Breast cancer Paternal Aunt   . Diabetes Maternal Grandmother   . Heart disease Maternal Grandmother   . Heart failure Maternal Grandmother   . Heart attack Maternal Grandfather   . Cancer Paternal Grandmother        breast cancer  . Breast cancer Paternal Grandmother   . Cancer Paternal Grandfather        lung cancer    Social History   Socioeconomic History  . Marital status: Single    Spouse name: Not on file  . Number of children: 1  . Years of education: Not on file  . Highest education level: GED or equivalent  Occupational History  . Not on file  Tobacco Use  . Smoking status: Current Every Day Smoker     Packs/day: 0.50    Years: 5.00    Pack years: 2.50    Types: Cigarettes    Last attempt to quit: 05/08/2010    Years since quitting: 11.0  . Smokeless tobacco: Never Used  Vaping Use  . Vaping Use: Never used  Substance and Sexual Activity  . Alcohol use: No  . Drug use: No  . Sexual activity: Not Currently    Birth control/protection: Surgical  Other Topics Concern  . Not on file  Social History Narrative   Lives with son Marlene Bast is 20 years old.   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 pseudoephedrine (SUDAFED) 120 MG 12 hr tablet Take 120 mg by mouth every 12 (twelve) hours as needed for congestion.    Marland Kitchen acetaminophen (TYLENOL) 500 MG tablet Take 500 mg by mouth every 6 (six) hours as needed.     No facility-administered medications prior to visit.    Allergies  Allergen Reactions  . Penicillins Nausea And Vomiting and Rash    Review of Systems  Constitutional: Positive for fatigue. Negative for chills and fever.  HENT: Positive for congestion, sinus pressure, sinus pain and sore throat. Negative for rhinorrhea.   Respiratory: Negative for cough, shortness of breath and wheezing.        Objective:    Physical Exam  There were no vitals taken for this visit. Wt Readings from Last 3 Encounters:  05/09/21 169 lb (76.7 kg)  11/08/20 168 lb (76.2 kg)  08/08/20  169 lb (76.7 kg)    Health Maintenance Due  Topic Date Due  . COVID-19 Vaccine (3 - Booster for Moderna series) 09/04/2020    There are no preventive care reminders to display for this patient.   Lab Results  Component Value Date   TSH 1.36 02/23/2020   Lab Results  Component Value Date   WBC 8.6 02/23/2020   HGB 14.5 02/23/2020   HCT 42.1 02/23/2020   MCV 83.7 02/23/2020   PLT 372 02/23/2020   Lab Results  Component Value Date   NA 139 02/23/2020   K 4.2 02/23/2020   CO2 26 02/23/2020   GLUCOSE 85 02/23/2020   BUN 8 02/23/2020   CREATININE 0.59 02/23/2020   BILITOT 0.7 02/23/2020   ALKPHOS 56 09/19/2019   AST 14 02/23/2020   ALT 14 02/23/2020   PROT 6.8 02/23/2020   ALBUMIN 4.2 09/19/2019   CALCIUM 9.2 02/23/2020   ANIONGAP 6 09/19/2019   Lab Results  Component Value Date   CHOL 189 02/23/2020   Lab Results  Component Value Date   HDL 45 (L) 02/23/2020   Lab Results  Component Value Date   LDLCALC 128 (H) 02/23/2020   Lab Results  Component Value Date   TRIG 66 02/23/2020   Lab Results  Component Value Date   CHOLHDL 4.2 02/23/2020   Lab Results  Component Value Date   HGBA1C 5.3 02/23/2020       Assessment & Plan:   Problem List Items Addressed This Visit      Respiratory   URI (upper respiratory infection)    -likely viral, so she will get COVID testing -she will use OTCs to treat symptoms -if no improvement by Thurs/Fri, will consider abx (She has PCN allergy          No orders of the defined types were placed in this encounter.  Date:  05/28/2021   Location of Patient: Home Location of Provider: Office Consent was obtain for visit to be over via telehealth. I verified that I am speaking with the correct person using two identifiers.  I connected with  FELESHIA ZUNDEL on 05/28/21 via telephone and verified that I am speaking with the correct person using two identifiers.   I discussed the limitations of evaluation and  management by telemedicine. The patient expressed understanding and agreed to proceed.  Time spent: 6 minutes   Heather Roberts, NP

## 2021-06-20 ENCOUNTER — Encounter: Payer: Self-pay | Admitting: Nurse Practitioner

## 2021-06-20 ENCOUNTER — Ambulatory Visit (INDEPENDENT_AMBULATORY_CARE_PROVIDER_SITE_OTHER): Payer: Medicaid Other | Admitting: Nurse Practitioner

## 2021-06-20 ENCOUNTER — Other Ambulatory Visit: Payer: Self-pay

## 2021-06-20 VITALS — BP 131/88 | HR 91 | Temp 98.5°F | Ht 60.0 in | Wt 173.0 lb

## 2021-06-20 DIAGNOSIS — J069 Acute upper respiratory infection, unspecified: Secondary | ICD-10-CM

## 2021-06-20 DIAGNOSIS — IMO0001 Reserved for inherently not codable concepts without codable children: Secondary | ICD-10-CM

## 2021-06-20 DIAGNOSIS — Z0001 Encounter for general adult medical examination with abnormal findings: Secondary | ICD-10-CM

## 2021-06-20 DIAGNOSIS — F529 Unspecified sexual dysfunction not due to a substance or known physiological condition: Secondary | ICD-10-CM | POA: Diagnosis not present

## 2021-06-20 DIAGNOSIS — Z0000 Encounter for general adult medical examination without abnormal findings: Secondary | ICD-10-CM

## 2021-06-20 MED ORDER — AZITHROMYCIN 250 MG PO TABS: ORAL_TABLET | ORAL | 0 refills | Status: DC

## 2021-06-20 MED ORDER — NOREL AD 4-10-325 MG PO TABS: 1.0000 | ORAL_TABLET | ORAL | 1 refills | Status: DC | PRN

## 2021-06-20 NOTE — Progress Notes (Signed)
Acute Office Visit  Subjective:    Patient ID: Jacqueline [name], female    DOB: Feb 02, 1976, 45 y.o.   MRN: [id number]  Chief Complaint  Patient presents with   Sinusitis    Follow up. Has had 3 negative Covid tests. Has had sinus congestion and pressure ongoing for 1 month now.    Sinusitis Associated symptoms include congestion and sinus pressure. Pertinent negatives include no sneezing or sore throat.  Patient is in today for sinus issues and repeat HIV testing.  She had a televisit on 05/28/21 for sinus issues. At that time it was likely viral, and it was treated with OTC symptoms medication.  Past Medical History:  Diagnosis Date   Annual visit for general adult medical examination with abnormal findings 02/22/2020   Encounter for TB tine test 02/22/2020   GERD (gastroesophageal reflux disease)    Kidney stones    hx UTI   Need for immunization against influenza 12/29/2019   Sinusitis    URI (upper respiratory infection)    UTI (lower urinary tract infection)     Past Surgical History:  Procedure Laterality Date   DILATION AND CURETTAGE OF UTERUS     TUBAL LIGATION      Family History  Problem Relation Age of Onset   Diabetes Other    Heart disease Mother    Diabetes Maternal Aunt    Cancer Paternal Aunt        lymph node, breast, and ovarian   Breast cancer Paternal Aunt    Diabetes Maternal Grandmother    Heart disease Maternal Grandmother    Heart failure Maternal Grandmother    Heart attack Maternal Grandfather    Cancer Paternal Grandmother        breast cancer   Breast cancer Paternal Grandmother    Cancer Paternal Grandfather        lung cancer    Social History   Socioeconomic History   Marital status: Single    Spouse name: Not on file   Number of children: 1   Years of education: Not on file   Highest education level: GED or equivalent  Occupational History   Not on file  Tobacco Use   Smoking status: Every Day    Packs/day: 0.50    Years:  5.00    Pack years: 2.50    Types: Cigarettes    Last attempt to quit: 05/08/2010    Years since quitting: 11.1   Smokeless tobacco: Never  Vaping Use   Vaping Use: Never used  Substance and Sexual Activity   Alcohol use: No   Drug use: No   Sexual activity: Not Currently    Birth control/protection: Surgical  Other Topics Concern   Not on file  Social History Narrative   Lives with son Cornelia Copa is 21 years old.   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     pseudoephedrine (SUDAFED) 120 MG 12 hr tablet Take 120 mg by mouth every 12 (twelve) hours as needed for congestion.     No facility-administered medications prior to visit.    Allergies  Allergen Reactions   Penicillins Nausea And Vomiting and Rash    Review of Systems  Constitutional: Negative.   HENT:  Positive for congestion, facial swelling, postnasal drip, sinus pressure and sinus pain. Negative for sneezing and sore throat.   Respiratory: Negative.    Cardiovascular: Negative.       Objective:    Physical Exam Constitutional:      Appearance: Normal appearance.  HENT:     Right Ear: Tympanic membrane, ear canal and external ear normal.     Left Ear: Tympanic membrane, ear canal and external ear normal.     Nose:  Congestion present.     Comments: Maxillary sinus tenderness on palpation    Mouth/Throat:     Mouth: Mucous membranes are moist.     Pharynx: Oropharynx is clear.  Cardiovascular:     Rate and Rhythm: Normal rate and regular rhythm.     Pulses: Normal pulses.     Heart sounds: Normal heart sounds.  Pulmonary:     Effort: Pulmonary effort is normal.     Breath sounds: Normal breath sounds.  Lymphadenopathy:     Cervical: Cervical adenopathy present.  Neurological:     Mental Status: She is alert.    BP 131/88 (BP Location: Right Arm, Patient Position: Sitting, Cuff Size: Normal)   Pulse 91   Temp 98.5 F (36.9 C) (Temporal)   Ht 5' (1.524 m)   Wt 173 lb (78.5 kg)   LMP  (LMP Unknown)   SpO2 96%   BMI 33.79 kg/m  Wt Readings from Last 3 Encounters:  06/20/21 173 lb (78.5 kg)  05/09/21 169 lb (76.7 kg)  11/08/20 168 lb (76.2 kg)    There are no preventive care reminders to display for this patient.  There are no preventive care reminders to display for this patient.   Lab Results  Component Value Date   TSH 1.36 02/23/2020   Lab Results  Component Value Date   WBC 8.6 02/23/2020   HGB 14.5 02/23/2020   HCT 42.1 02/23/2020   MCV 83.7 02/23/2020   PLT 372 02/23/2020   Lab Results  Component Value Date   NA 139 02/23/2020   K 4.2 02/23/2020   CO2 26 02/23/2020   GLUCOSE 85 02/23/2020   BUN 8 02/23/2020   CREATININE 0.59 02/23/2020   BILITOT 0.7 02/23/2020   ALKPHOS 56 09/19/2019   AST 14 02/23/2020   ALT 14 02/23/2020   PROT 6.8 02/23/2020   ALBUMIN 4.2 09/19/2019   CALCIUM 9.2 02/23/2020   ANIONGAP 6 09/19/2019   Lab Results  Component Value Date   CHOL 189 02/23/2020   Lab Results  Component Value Date   HDL 45 (L) 02/23/2020   Lab Results  Component Value Date   LDLCALC 128 (H) 02/23/2020   Lab Results  Component Value Date   TRIG 66 02/23/2020   Lab Results  Component Value Date   CHOLHDL 4.2 02/23/2020   Lab Results  Component  Value Date  HGBA1C 5.3 02/23/2020       Assessment & Plan:   Problem List Items Addressed This Visit       Respiratory   URI (upper respiratory infection) - Primary    -ongoing x3 weeks -Rx. z-pack (allergic to augmentin) -Rx. norel        Relevant Medications   Chlorphen-PE-Acetaminophen (NOREL AD) 4-10-325 MG TABS   azithromycin (ZITHROMAX) 250 MG tablet     Other   Sexuality issues    -ordered f/u HIV testing (last was 6 weeks ago) -if negative, no further testing required       Relevant Orders   HIV Antibody (routine testing w rflx)   Other Visit Diagnoses     Routine general medical examination at a health care facility       Relevant Orders   CBC with Differential/Platelet   CMP14+EGFR   Lipid Panel With LDL/HDL Ratio   TSH        Meds ordered this encounter  Medications   Chlorphen-PE-Acetaminophen (NOREL AD) 4-10-325 MG TABS    Sig: Take 1 tablet by mouth every 4 (four) hours as needed (nasal congestion, cold symptoms).    Dispense:  20 tablet    Refill:  1   azithromycin (ZITHROMAX) 250 MG tablet    Sig: Please dispense as a z-pack    Dispense:  6 tablet    Refill:  0     Noreene Larsson, NP

## 2021-06-20 NOTE — Assessment & Plan Note (Signed)
-  ordered f/u HIV testing (last was 6 weeks ago) -if negative, no further testing required

## 2021-06-20 NOTE — Patient Instructions (Signed)
Please have fasting labs drawn 2-3 days prior to your appointment so we can discuss the results during your office visit.  

## 2021-06-20 NOTE — Assessment & Plan Note (Signed)
-  ongoing x3 weeks -Rx. z-pack (allergic to augmentin) -Rx. norel

## 2021-06-24 ENCOUNTER — Telehealth: Payer: Self-pay

## 2021-06-24 DIAGNOSIS — F411 Generalized anxiety disorder: Secondary | ICD-10-CM | POA: Diagnosis not present

## 2021-06-24 DIAGNOSIS — F410 Panic disorder [episodic paroxysmal anxiety] without agoraphobia: Secondary | ICD-10-CM | POA: Diagnosis not present

## 2021-06-24 DIAGNOSIS — F331 Major depressive disorder, recurrent, moderate: Secondary | ICD-10-CM | POA: Diagnosis not present

## 2021-06-24 NOTE — Telephone Encounter (Signed)
Patient called said she was just seen last Thursday and was given a z-pack asking if this would help her other ear now she has drainage and pain. Call back # [phone]

## 2021-06-24 NOTE — Telephone Encounter (Signed)
Pt informed to give a few more days and finish up her abt.

## 2021-07-24 DIAGNOSIS — F411 Generalized anxiety disorder: Secondary | ICD-10-CM | POA: Diagnosis not present

## 2021-07-24 DIAGNOSIS — F331 Major depressive disorder, recurrent, moderate: Secondary | ICD-10-CM | POA: Diagnosis not present

## 2021-07-24 DIAGNOSIS — F410 Panic disorder [episodic paroxysmal anxiety] without agoraphobia: Secondary | ICD-10-CM | POA: Diagnosis not present

## 2021-08-20 DIAGNOSIS — F4312 Post-traumatic stress disorder, chronic: Secondary | ICD-10-CM | POA: Diagnosis not present

## 2021-08-21 DIAGNOSIS — F410 Panic disorder [episodic paroxysmal anxiety] without agoraphobia: Secondary | ICD-10-CM | POA: Diagnosis not present

## 2021-08-21 DIAGNOSIS — F411 Generalized anxiety disorder: Secondary | ICD-10-CM | POA: Diagnosis not present

## 2021-08-21 DIAGNOSIS — F331 Major depressive disorder, recurrent, moderate: Secondary | ICD-10-CM | POA: Diagnosis not present

## 2021-08-24 DIAGNOSIS — F4312 Post-traumatic stress disorder, chronic: Secondary | ICD-10-CM | POA: Diagnosis not present

## 2021-08-27 DIAGNOSIS — F4312 Post-traumatic stress disorder, chronic: Secondary | ICD-10-CM | POA: Diagnosis not present

## 2021-08-31 DIAGNOSIS — F4312 Post-traumatic stress disorder, chronic: Secondary | ICD-10-CM | POA: Diagnosis not present

## 2021-09-03 DIAGNOSIS — F4312 Post-traumatic stress disorder, chronic: Secondary | ICD-10-CM | POA: Diagnosis not present

## 2021-09-07 DIAGNOSIS — F4312 Post-traumatic stress disorder, chronic: Secondary | ICD-10-CM | POA: Diagnosis not present

## 2021-09-10 DIAGNOSIS — F4312 Post-traumatic stress disorder, chronic: Secondary | ICD-10-CM | POA: Diagnosis not present

## 2021-09-14 DIAGNOSIS — F4312 Post-traumatic stress disorder, chronic: Secondary | ICD-10-CM | POA: Diagnosis not present

## 2021-09-17 DIAGNOSIS — F4312 Post-traumatic stress disorder, chronic: Secondary | ICD-10-CM | POA: Diagnosis not present

## 2021-09-18 DIAGNOSIS — F411 Generalized anxiety disorder: Secondary | ICD-10-CM | POA: Diagnosis not present

## 2021-09-18 DIAGNOSIS — F331 Major depressive disorder, recurrent, moderate: Secondary | ICD-10-CM | POA: Diagnosis not present

## 2021-09-18 DIAGNOSIS — F410 Panic disorder [episodic paroxysmal anxiety] without agoraphobia: Secondary | ICD-10-CM | POA: Diagnosis not present

## 2021-09-21 DIAGNOSIS — F4312 Post-traumatic stress disorder, chronic: Secondary | ICD-10-CM | POA: Diagnosis not present

## 2021-09-24 DIAGNOSIS — F4312 Post-traumatic stress disorder, chronic: Secondary | ICD-10-CM | POA: Diagnosis not present

## 2021-09-28 DIAGNOSIS — F4312 Post-traumatic stress disorder, chronic: Secondary | ICD-10-CM | POA: Diagnosis not present

## 2021-09-29 NOTE — BH Specialist Note (Signed)
Error

## 2021-10-01 DIAGNOSIS — F4312 Post-traumatic stress disorder, chronic: Secondary | ICD-10-CM | POA: Diagnosis not present

## 2021-10-05 DIAGNOSIS — F4312 Post-traumatic stress disorder, chronic: Secondary | ICD-10-CM | POA: Diagnosis not present

## 2021-10-08 DIAGNOSIS — F4312 Post-traumatic stress disorder, chronic: Secondary | ICD-10-CM | POA: Diagnosis not present

## 2021-10-09 DIAGNOSIS — F4312 Post-traumatic stress disorder, chronic: Secondary | ICD-10-CM | POA: Diagnosis not present

## 2021-10-10 DIAGNOSIS — F4312 Post-traumatic stress disorder, chronic: Secondary | ICD-10-CM | POA: Diagnosis not present

## 2021-10-11 DIAGNOSIS — F4312 Post-traumatic stress disorder, chronic: Secondary | ICD-10-CM | POA: Diagnosis not present

## 2021-10-12 DIAGNOSIS — F4312 Post-traumatic stress disorder, chronic: Secondary | ICD-10-CM | POA: Diagnosis not present

## 2021-10-15 DIAGNOSIS — F4312 Post-traumatic stress disorder, chronic: Secondary | ICD-10-CM | POA: Diagnosis not present

## 2021-10-16 DIAGNOSIS — F331 Major depressive disorder, recurrent, moderate: Secondary | ICD-10-CM | POA: Diagnosis not present

## 2021-10-16 DIAGNOSIS — F411 Generalized anxiety disorder: Secondary | ICD-10-CM | POA: Diagnosis not present

## 2021-10-16 DIAGNOSIS — F410 Panic disorder [episodic paroxysmal anxiety] without agoraphobia: Secondary | ICD-10-CM | POA: Diagnosis not present

## 2021-10-19 DIAGNOSIS — F4312 Post-traumatic stress disorder, chronic: Secondary | ICD-10-CM | POA: Diagnosis not present

## 2021-10-22 DIAGNOSIS — F4312 Post-traumatic stress disorder, chronic: Secondary | ICD-10-CM | POA: Diagnosis not present

## 2021-10-26 DIAGNOSIS — F4312 Post-traumatic stress disorder, chronic: Secondary | ICD-10-CM | POA: Diagnosis not present

## 2021-10-29 DIAGNOSIS — F411 Generalized anxiety disorder: Secondary | ICD-10-CM | POA: Diagnosis not present

## 2021-10-29 DIAGNOSIS — F410 Panic disorder [episodic paroxysmal anxiety] without agoraphobia: Secondary | ICD-10-CM | POA: Diagnosis not present

## 2021-10-29 DIAGNOSIS — F4312 Post-traumatic stress disorder, chronic: Secondary | ICD-10-CM | POA: Diagnosis not present

## 2021-10-29 DIAGNOSIS — F331 Major depressive disorder, recurrent, moderate: Secondary | ICD-10-CM | POA: Diagnosis not present

## 2021-11-02 DIAGNOSIS — F4312 Post-traumatic stress disorder, chronic: Secondary | ICD-10-CM | POA: Diagnosis not present

## 2021-11-05 DIAGNOSIS — F4312 Post-traumatic stress disorder, chronic: Secondary | ICD-10-CM | POA: Diagnosis not present

## 2021-11-09 DIAGNOSIS — F4312 Post-traumatic stress disorder, chronic: Secondary | ICD-10-CM | POA: Diagnosis not present

## 2021-11-12 DIAGNOSIS — F4312 Post-traumatic stress disorder, chronic: Secondary | ICD-10-CM | POA: Diagnosis not present

## 2021-11-15 DIAGNOSIS — F331 Major depressive disorder, recurrent, moderate: Secondary | ICD-10-CM | POA: Diagnosis not present

## 2021-11-15 DIAGNOSIS — F410 Panic disorder [episodic paroxysmal anxiety] without agoraphobia: Secondary | ICD-10-CM | POA: Diagnosis not present

## 2021-11-15 DIAGNOSIS — F411 Generalized anxiety disorder: Secondary | ICD-10-CM | POA: Diagnosis not present

## 2021-11-16 DIAGNOSIS — F4312 Post-traumatic stress disorder, chronic: Secondary | ICD-10-CM | POA: Diagnosis not present

## 2021-11-18 ENCOUNTER — Encounter: Payer: Self-pay | Admitting: Nurse Practitioner

## 2021-11-18 ENCOUNTER — Other Ambulatory Visit: Payer: Self-pay | Admitting: Nurse Practitioner

## 2021-11-18 ENCOUNTER — Other Ambulatory Visit: Payer: Self-pay

## 2021-11-18 ENCOUNTER — Other Ambulatory Visit: Payer: Self-pay | Admitting: *Deleted

## 2021-11-18 ENCOUNTER — Ambulatory Visit (INDEPENDENT_AMBULATORY_CARE_PROVIDER_SITE_OTHER): Payer: Medicaid Other | Admitting: Nurse Practitioner

## 2021-11-18 ENCOUNTER — Telehealth: Payer: Self-pay | Admitting: Nurse Practitioner

## 2021-11-18 VITALS — BP 129/86 | HR 100 | Ht 60.0 in | Wt 172.0 lb

## 2021-11-18 DIAGNOSIS — Z111 Encounter for screening for respiratory tuberculosis: Secondary | ICD-10-CM | POA: Diagnosis not present

## 2021-11-18 DIAGNOSIS — F419 Anxiety disorder, unspecified: Secondary | ICD-10-CM

## 2021-11-18 DIAGNOSIS — Z0000 Encounter for general adult medical examination without abnormal findings: Secondary | ICD-10-CM | POA: Diagnosis not present

## 2021-11-18 DIAGNOSIS — E669 Obesity, unspecified: Secondary | ICD-10-CM | POA: Diagnosis not present

## 2021-11-18 DIAGNOSIS — N6320 Unspecified lump in the left breast, unspecified quadrant: Secondary | ICD-10-CM | POA: Diagnosis not present

## 2021-11-18 DIAGNOSIS — R220 Localized swelling, mass and lump, head: Secondary | ICD-10-CM

## 2021-11-18 DIAGNOSIS — E559 Vitamin D deficiency, unspecified: Secondary | ICD-10-CM | POA: Diagnosis not present

## 2021-11-18 DIAGNOSIS — Z23 Encounter for immunization: Secondary | ICD-10-CM | POA: Diagnosis not present

## 2021-11-18 DIAGNOSIS — Z139 Encounter for screening, unspecified: Secondary | ICD-10-CM

## 2021-11-18 DIAGNOSIS — R030 Elevated blood-pressure reading, without diagnosis of hypertension: Secondary | ICD-10-CM

## 2021-11-18 DIAGNOSIS — Z720 Tobacco use: Secondary | ICD-10-CM | POA: Diagnosis not present

## 2021-11-18 DIAGNOSIS — F321 Major depressive disorder, single episode, moderate: Secondary | ICD-10-CM | POA: Diagnosis not present

## 2021-11-18 DIAGNOSIS — R21 Rash and other nonspecific skin eruption: Secondary | ICD-10-CM

## 2021-11-18 NOTE — Assessment & Plan Note (Signed)
Redness and rash under right breast. Keep site clean and dry.  Probably due to moisture.

## 2021-11-18 NOTE — Assessment & Plan Note (Signed)
mammogram scheduled today.

## 2021-11-18 NOTE — Assessment & Plan Note (Addendum)
Annual exam as documented.  Counseling done include healthy lifestyle involving committing to 150 minutes of exercise per week, heart healthy diet, and attaining healthy weight. .  Regular use of seat belt and home safety were also discussed . Changes in health habits are decided on by patient with goals and time frames set for achieving them. Immunization and cancer screening  needs are specifically addressed at this visit.     Routine labs done PPD screening done today.  HEP B and MMR titers obtained. Needed for new job

## 2021-11-18 NOTE — Assessment & Plan Note (Signed)
Condition stable. Sees psych.  Continue current med

## 2021-11-18 NOTE — Progress Notes (Signed)
Established Patient Office Visit  Subjective:  Patient ID: Jacqueline [name], female    DOB: [birth year]  Age: 45 y.o. MRN: [id number]  CC:  Chief Complaint  Patient presents with  . Annual Exam    Annual Exam - Does need Mammogram     HPI Jacqueline [name] presents for presents for annual exam Pt c/o of welling to right side of her jaw. She stated that the swelling has  been ongoing for about two years. When she chews she does have aching pain 4/10. Pt denies fever, chills, malaise, aches , CP , palpitation, wheezing , SOB.  PT c/o of infrequent menses ongoing sine she turned 42. When she does have messes it stays for about 2 days, Sometimes heavy sometimes light. Sometimes have cold and heat intolerance. PT denies pelvic pain, abnormal vaginal dc, PT thinks she is in perimenopause stage.   Past Medical History:  Diagnosis Date  . Annual visit for general adult medical examination with abnormal findings 02/22/2020  . Encounter for TB tine test 02/22/2020  . GERD (gastroesophageal reflux disease)   . Kidney stones    hx UTI  . Need for immunization against influenza 12/29/2019  . Sinusitis   . URI (upper respiratory infection)   . UTI (lower urinary tract infection)     Past Surgical History:  Procedure Laterality Date  . DILATION AND CURETTAGE OF UTERUS    . TUBAL LIGATION      Family History  Problem Relation Age of Onset  . Diabetes Other   . Heart disease Mother   . Diabetes Maternal Aunt   . Cancer Paternal Aunt        lymph node, breast, and ovarian  . Breast cancer Paternal Aunt   . Diabetes Maternal Grandmother   . Heart disease Maternal Grandmother   . Heart failure Maternal Grandmother   . Heart attack Maternal Grandfather   . Cancer Paternal Grandmother        breast cancer  . Breast cancer Paternal Grandmother   . Cancer Paternal Grandfather        lung cancer    Social History   Socioeconomic History  . Marital status: Single    Spouse name: Not on file   . Number of children: 1  . Years of education: Not on file  . Highest education level: GED or equivalent  Occupational History  . Not on file  Tobacco Use  . Smoking status: Every Day    Packs/day: 0.50    Years: 5.00    Pack years: 2.50    Types: Cigarettes    Last attempt to quit: 05/08/2010    Years since quitting: 11.5  . Smokeless tobacco: Never  Vaping Use  . Vaping Use: Never used  Substance and Sexual Activity  . Alcohol use: No  . Drug use: No  . Sexual activity: Not Currently    Birth control/protection: Surgical  Other Topics Concern  . Not on file  Social History Narrative   Lives with son Marlene Bast is 5 years old.   Recent passing of fiance in oct 2020 from massive MI      Is wanting to go back to school for early childhood education      Diet: currently not eating a lot due to depression   Caffeine: pepsis   Water: daily 3-5 cups      Wears seat belt   Does not use phone while driving    Smokes detectors in home  Social Determinants of Health   Financial Resource Strain: Not on file  Food Insecurity: Not on file  Transportation Needs: Not on file  Physical Activity: Not on file  Stress: Not on file  Social Connections: Not on file  Intimate Partner Violence: Not on file    Outpatient Medications Prior to Visit  Medication Sig Dispense Refill  . ALPRAZolam (XANAX) 0.25 MG tablet Take 0.25 mg by mouth 2 (two) times daily as needed.    . busPIRone (BUSPAR) 5 MG tablet Take 5 mg by mouth 2 (two) times daily.    Marland Kitchen lamoTRIgine (LAMICTAL) 25 MG tablet Take 50 mg by mouth daily.    Marland Kitchen azithromycin (ZITHROMAX) 250 MG tablet Please dispense as a z-pack 6 tablet 0  . Chlorphen-PE-Acetaminophen (NOREL AD) 4-10-325 MG TABS Take 1 tablet by mouth every 4 (four) hours as needed (nasal congestion, cold symptoms). 20 tablet 1   No facility-administered medications prior to visit.    Allergies  Allergen Reactions  . Penicillins Nausea And Vomiting and Rash     ROS Review of Systems  Constitutional:  Negative for activity change, appetite change, chills, diaphoresis, fatigue, fever and unexpected weight change.  HENT:  Negative for congestion, dental problem, drooling, ear discharge, ear pain, facial swelling, hearing loss and mouth sores.   Eyes:  Negative for pain, discharge, redness and itching.  Respiratory:  Negative for cough, chest tightness, shortness of breath and wheezing.   Cardiovascular:  Negative for chest pain, palpitations and leg swelling.  Gastrointestinal:  Negative for abdominal distention, abdominal pain, blood in stool, constipation, diarrhea and nausea.  Endocrine: Positive for heat intolerance, polydipsia, polyphagia and polyuria.  Genitourinary:  Positive for frequency and menstrual problem. Negative for decreased urine volume, difficulty urinating, dysuria, flank pain, pelvic pain, urgency, vaginal bleeding, vaginal discharge and vaginal pain.       Infrequent menstruation  Musculoskeletal:  Negative for arthralgias, back pain, gait problem, joint swelling, myalgias and neck pain.  Skin:  Negative for color change, pallor, rash and wound.  Allergic/Immunologic: Negative for environmental allergies, food allergies and immunocompromised state.  Neurological:  Negative for dizziness, tremors, facial asymmetry, speech difficulty, weakness, light-headedness, numbness and headaches.  Hematological:  Negative for adenopathy. Does not bruise/bleed easily.  Psychiatric/Behavioral:  Negative for agitation, behavioral problems, confusion, self-injury and suicidal ideas. The patient is not nervous/anxious.      Objective:    Physical Exam Exam conducted with a chaperone present.  Constitutional:      General: She is not in acute distress.    Appearance: She is obese. She is not toxic-appearing or diaphoretic.  HENT:     Head: Normocephalic and atraumatic.     Right Ear: Tympanic membrane, ear canal and external ear normal.  There is no impacted cerumen.     Left Ear: Tympanic membrane, ear canal and external ear normal. There is no impacted cerumen.     Nose: Nose normal. No congestion or rhinorrhea.     Mouth/Throat:     Mouth: Mucous membranes are moist.     Pharynx: Oropharynx is clear. No oropharyngeal exudate or posterior oropharyngeal erythema.  Eyes:     General:        Right eye: No discharge.        Left eye: No discharge.     Extraocular Movements: Extraocular movements intact.     Conjunctiva/sclera: Conjunctivae normal.  Neck:     Vascular: No carotid bruit.     Comments: Left jaw swelling  and tenderness Cardiovascular:     Rate and Rhythm: Normal rate and regular rhythm.     Pulses: Normal pulses.     Heart sounds: Normal heart sounds. No murmur heard.   No friction rub.  Pulmonary:     Effort: Pulmonary effort is normal. No respiratory distress.     Breath sounds: Normal breath sounds. No stridor. No wheezing, rhonchi or rales.  Chest:     Chest wall: No tenderness.  Breasts:    Right: Normal. No swelling, bleeding, inverted nipple, mass, nipple discharge, skin change or tenderness.     Left: Tenderness present. No swelling, bleeding, inverted nipple, mass, nipple discharge or skin change.     Comments: Redness under right breast with rash. Tenderness on palpation of left breast on the left lower quad. Mammogram ordered.  Abdominal:     General: There is no distension.     Palpations: Abdomen is soft. There is no mass.     Tenderness: There is no abdominal tenderness. There is no right CVA tenderness, left CVA tenderness, guarding or rebound.     Hernia: No hernia is present.  Musculoskeletal:        General: No swelling, tenderness, deformity or signs of injury.     Cervical back: Normal range of motion and neck supple. No rigidity or tenderness.     Right lower leg: No edema.     Left lower leg: No edema.  Lymphadenopathy:     Cervical: No cervical adenopathy.     Upper Body:      Right upper body: No supraclavicular, axillary or pectoral adenopathy.     Left upper body: No supraclavicular, axillary or pectoral adenopathy.  Skin:    General: Skin is warm and dry.     Capillary Refill: Capillary refill takes less than 2 seconds.     Coloration: Skin is not jaundiced or pale.     Findings: Erythema and rash present. No bruising or lesion.     Comments: Redness and rash under right breast   Neurological:     General: No focal deficit present.     Mental Status: She is alert and oriented to person, place, and time.     Cranial Nerves: No cranial nerve deficit.     Sensory: No sensory deficit.     Motor: No weakness.     Coordination: Coordination normal.     Gait: Gait normal.  Psychiatric:        Mood and Affect: Mood normal.        Behavior: Behavior normal.        Thought Content: Thought content normal.        Judgment: Judgment normal.    BP 129/86 (BP Location: Left Arm, Patient Position: Sitting, Cuff Size: Normal)   Pulse 100   Ht 5' (1.524 m)   Wt 172 lb (78 kg)   BMI 33.59 kg/m  Wt Readings from Last 3 Encounters:  11/18/21 172 lb (78 kg)  06/20/21 173 lb (78.5 kg)  05/09/21 169 lb (76.7 kg)     Health Maintenance Due  Topic Date Due  . COVID-19 Vaccine (3 - Booster for Moderna series) 05/30/2020  . INFLUENZA VACCINE  07/29/2021    There are no preventive care reminders to display for this patient.  Lab Results  Component Value Date   TSH 1.36 02/23/2020   Lab Results  Component Value Date   WBC 8.6 02/23/2020   HGB 14.5 02/23/2020   HCT 42.1  02/23/2020   MCV 83.7 02/23/2020   PLT 372 02/23/2020   Lab Results  Component Value Date   NA 139 02/23/2020   K 4.2 02/23/2020   CO2 26 02/23/2020   GLUCOSE 85 02/23/2020   BUN 8 02/23/2020   CREATININE 0.59 02/23/2020   BILITOT 0.7 02/23/2020   ALKPHOS 56 09/19/2019   AST 14 02/23/2020   ALT 14 02/23/2020   PROT 6.8 02/23/2020   ALBUMIN 4.2 09/19/2019   CALCIUM 9.2 02/23/2020    ANIONGAP 6 09/19/2019   Lab Results  Component Value Date   CHOL 189 02/23/2020   Lab Results  Component Value Date   HDL 45 (L) 02/23/2020   Lab Results  Component Value Date   LDLCALC 128 (H) 02/23/2020   Lab Results  Component Value Date   TRIG 66 02/23/2020   Lab Results  Component Value Date   CHOLHDL 4.2 02/23/2020   Lab Results  Component Value Date   HGBA1C 5.3 02/23/2020      Assessment & Plan:   Problem List Items Addressed This Visit   None Visit Diagnoses     Annual physical exam    -  Primary   Relevant Orders   HgB A1c       No orders of the defined types were placed in this encounter.   Follow-up: No follow-ups on file.    Donell Beers, FNP

## 2021-11-18 NOTE — Telephone Encounter (Signed)
Ultrasound order has been placed.

## 2021-11-18 NOTE — Assessment & Plan Note (Signed)
BP Readings from Last 3 Encounters:  11/18/21 129/86  06/20/21 131/88  05/09/21 (!) 142/93  importance of diet , low salt diet and exercise discussed today.

## 2021-11-18 NOTE — Assessment & Plan Note (Addendum)
Takes xanax, buspar , lamotrigine. Sees psych Continue  current med. Denies depression , suicidal ideation.

## 2021-11-18 NOTE — Assessment & Plan Note (Signed)
Currently smokes one pack / day. Started smoking since age 45 , quit in 2012, restarted in 2020.  Smoking cessation edu completed, not ready to quit now.

## 2021-11-18 NOTE — Assessment & Plan Note (Signed)
importance of diet and regular vigorous exercise 30 minutes 5 days a week discussed. Low fat, heart healthy diet discussed witt pt.

## 2021-11-18 NOTE — Telephone Encounter (Signed)
Pt needs diagnostic mammo   Pt needs to have the left and right ultrasound added to the referral so that the mammo can be scheduled

## 2021-11-18 NOTE — Patient Instructions (Signed)
Labs done today.  Please return on Wednesday at 11am to read your TB skin test.  ENT referral made for your swollen jaw.  Mammogram schedule for you.  It is important that you exercise regularly at least 30 minutes 5 times a week.  Think about what you will eat, plan ahead. Choose " clean, green, fresh or frozen" over canned, processed or packaged foods which are more sugary, salty and fatty. 70 to 75% of food eaten should be vegetables and fruit. Three meals at set times with snacks allowed between meals, but they must be fruit or vegetables. Aim to eat over a 12 hour period , example 7 am to 7 pm, and STOP after  your last meal of the day. Drink water,generally about 64 ounces per day, no other drink is as healthy. Fruit juice is best enjoyed in a healthy way, by EATING the fruit.  Thanks for choosing [facility], we consider it a privelige to serve you.

## 2021-11-19 ENCOUNTER — Encounter: Payer: Self-pay | Admitting: Nurse Practitioner

## 2021-11-19 DIAGNOSIS — F4312 Post-traumatic stress disorder, chronic: Secondary | ICD-10-CM | POA: Diagnosis not present

## 2021-11-19 LAB — CBC WITH DIFFERENTIAL/PLATELET
Basophils Absolute: 0 10*3/uL (ref 0.0–0.2)
Basos: 0 %
EOS (ABSOLUTE): 0.1 10*3/uL (ref 0.0–0.4)
Eos: 1 %
Hematocrit: 49.2 % — ABNORMAL HIGH (ref 34.0–46.6)
Hemoglobin: 16.9 g/dL — ABNORMAL HIGH (ref 11.1–15.9)
Immature Grans (Abs): 0 10*3/uL (ref 0.0–0.1)
Immature Granulocytes: 0 %
Lymphocytes Absolute: 3.1 10*3/uL (ref 0.7–3.1)
Lymphs: 32 %
MCH: 28.5 pg (ref 26.6–33.0)
MCHC: 34.3 g/dL (ref 31.5–35.7)
MCV: 83 fL (ref 79–97)
Monocytes Absolute: 0.5 10*3/uL (ref 0.1–0.9)
Monocytes: 5 %
Neutrophils Absolute: 5.9 10*3/uL (ref 1.4–7.0)
Neutrophils: 62 %
Platelets: 364 10*3/uL (ref 150–450)
RBC: 5.93 x10E6/uL — ABNORMAL HIGH (ref 3.77–5.28)
RDW: 13.2 % (ref 11.7–15.4)
WBC: 9.7 10*3/uL (ref 3.4–10.8)

## 2021-11-19 LAB — HEMOGLOBIN A1C
Est. average glucose Bld gHb Est-mCnc: 120 mg/dL
Hgb A1c MFr Bld: 5.8 % — ABNORMAL HIGH (ref 4.8–5.6)

## 2021-11-19 LAB — CMP14+EGFR
ALT: 15 IU/L (ref 0–32)
AST: 18 IU/L (ref 0–40)
Albumin/Globulin Ratio: 1.8 (ref 1.2–2.2)
Albumin: 4.9 g/dL — ABNORMAL HIGH (ref 3.8–4.8)
Alkaline Phosphatase: 96 IU/L (ref 44–121)
BUN/Creatinine Ratio: 11 (ref 9–23)
BUN: 8 mg/dL (ref 6–24)
Bilirubin Total: 0.6 mg/dL (ref 0.0–1.2)
CO2: 23 mmol/L (ref 20–29)
Calcium: 9.7 mg/dL (ref 8.7–10.2)
Chloride: 101 mmol/L (ref 96–106)
Creatinine, Ser: 0.7 mg/dL (ref 0.57–1.00)
Globulin, Total: 2.8 g/dL (ref 1.5–4.5)
Glucose: 93 mg/dL (ref 70–99)
Potassium: 4.6 mmol/L (ref 3.5–5.2)
Sodium: 139 mmol/L (ref 134–144)
Total Protein: 7.7 g/dL (ref 6.0–8.5)
eGFR: 110 mL/min/{1.73_m2} (ref >59)

## 2021-11-19 LAB — LIPID PANEL WITH LDL/HDL RATIO
Cholesterol, Total: 234 mg/dL — ABNORMAL HIGH (ref 100–199)
HDL: 44 mg/dL (ref >39)
LDL Chol Calc (NIH): 164 mg/dL — ABNORMAL HIGH (ref 0–99)
LDL/HDL Ratio: 3.7 ratio — ABNORMAL HIGH (ref 0.0–3.2)
Triglycerides: 144 mg/dL (ref 0–149)
VLDL Cholesterol Cal: 26 mg/dL (ref 5–40)

## 2021-11-19 LAB — TSH: TSH: 1.48 u[IU]/mL (ref 0.450–4.500)

## 2021-11-20 ENCOUNTER — Other Ambulatory Visit: Payer: Self-pay

## 2021-11-20 ENCOUNTER — Ambulatory Visit: Payer: Medicaid Other

## 2021-11-20 LAB — TB SKIN TEST
Induration: 0 mm
TB Skin Test: NEGATIVE

## 2021-11-20 LAB — MEASLES/MUMPS/RUBELLA IMMUNITY
MUMPS ABS, IGG: <9 AU/mL — ABNORMAL LOW (ref >10.9)
RUBEOLA AB, IGG: <13.5 AU/mL — ABNORMAL LOW (ref >16.4)
Rubella Antibodies, IGG: 1.21 index (ref >0.99)

## 2021-11-20 LAB — HEPATITIS B SURFACE ANTIBODY,QUALITATIVE: Hep B Surface Ab, Qual: NONREACTIVE

## 2021-11-20 MED ORDER — UNABLE TO FIND: 0 refills | Status: DC

## 2021-11-23 DIAGNOSIS — F4312 Post-traumatic stress disorder, chronic: Secondary | ICD-10-CM | POA: Diagnosis not present

## 2021-11-26 DIAGNOSIS — F4312 Post-traumatic stress disorder, chronic: Secondary | ICD-10-CM | POA: Diagnosis not present

## 2021-11-30 DIAGNOSIS — F4312 Post-traumatic stress disorder, chronic: Secondary | ICD-10-CM | POA: Diagnosis not present

## 2021-12-03 DIAGNOSIS — F4312 Post-traumatic stress disorder, chronic: Secondary | ICD-10-CM | POA: Diagnosis not present

## 2021-12-04 NOTE — Progress Notes (Signed)
I think you saw her most recently, and you will see her again in February.

## 2021-12-14 DIAGNOSIS — F4312 Post-traumatic stress disorder, chronic: Secondary | ICD-10-CM | POA: Diagnosis not present

## 2021-12-16 DIAGNOSIS — F411 Generalized anxiety disorder: Secondary | ICD-10-CM | POA: Diagnosis not present

## 2021-12-16 DIAGNOSIS — F410 Panic disorder [episodic paroxysmal anxiety] without agoraphobia: Secondary | ICD-10-CM | POA: Diagnosis not present

## 2021-12-16 DIAGNOSIS — F331 Major depressive disorder, recurrent, moderate: Secondary | ICD-10-CM | POA: Diagnosis not present

## 2021-12-17 ENCOUNTER — Ambulatory Visit (HOSPITAL_COMMUNITY): Payer: Medicaid Other

## 2021-12-17 ENCOUNTER — Encounter (HOSPITAL_COMMUNITY): Payer: Medicaid Other

## 2021-12-24 DIAGNOSIS — F4312 Post-traumatic stress disorder, chronic: Secondary | ICD-10-CM | POA: Diagnosis not present

## 2021-12-26 ENCOUNTER — Encounter: Payer: Medicaid Other | Admitting: Nurse Practitioner

## 2022-01-13 DIAGNOSIS — F4312 Post-traumatic stress disorder, chronic: Secondary | ICD-10-CM | POA: Diagnosis not present

## 2022-01-13 DIAGNOSIS — F411 Generalized anxiety disorder: Secondary | ICD-10-CM | POA: Diagnosis not present

## 2022-01-13 DIAGNOSIS — F341 Dysthymic disorder: Secondary | ICD-10-CM | POA: Diagnosis not present

## 2022-01-16 DIAGNOSIS — F411 Generalized anxiety disorder: Secondary | ICD-10-CM | POA: Diagnosis not present

## 2022-01-16 DIAGNOSIS — F410 Panic disorder [episodic paroxysmal anxiety] without agoraphobia: Secondary | ICD-10-CM | POA: Diagnosis not present

## 2022-01-16 DIAGNOSIS — F331 Major depressive disorder, recurrent, moderate: Secondary | ICD-10-CM | POA: Diagnosis not present

## 2022-02-10 DIAGNOSIS — F341 Dysthymic disorder: Secondary | ICD-10-CM | POA: Diagnosis not present

## 2022-02-10 DIAGNOSIS — F411 Generalized anxiety disorder: Secondary | ICD-10-CM | POA: Diagnosis not present

## 2022-02-10 DIAGNOSIS — F4310 Post-traumatic stress disorder, unspecified: Secondary | ICD-10-CM | POA: Diagnosis not present

## 2022-02-11 ENCOUNTER — Other Ambulatory Visit: Payer: Self-pay

## 2022-02-11 ENCOUNTER — Encounter: Payer: Self-pay | Admitting: Nurse Practitioner

## 2022-02-11 ENCOUNTER — Ambulatory Visit (INDEPENDENT_AMBULATORY_CARE_PROVIDER_SITE_OTHER): Payer: Medicaid Other | Admitting: Nurse Practitioner

## 2022-02-11 VITALS — BP 112/88 | HR 88 | Ht 60.0 in | Wt 179.1 lb

## 2022-02-11 DIAGNOSIS — E669 Obesity, unspecified: Secondary | ICD-10-CM | POA: Diagnosis not present

## 2022-02-11 DIAGNOSIS — D751 Secondary polycythemia: Secondary | ICD-10-CM | POA: Diagnosis not present

## 2022-02-11 DIAGNOSIS — E782 Mixed hyperlipidemia: Secondary | ICD-10-CM | POA: Insufficient documentation

## 2022-02-11 DIAGNOSIS — N6320 Unspecified lump in the left breast, unspecified quadrant: Secondary | ICD-10-CM | POA: Diagnosis not present

## 2022-02-11 DIAGNOSIS — Z1231 Encounter for screening mammogram for malignant neoplasm of breast: Secondary | ICD-10-CM | POA: Insufficient documentation

## 2022-02-11 DIAGNOSIS — F321 Major depressive disorder, single episode, moderate: Secondary | ICD-10-CM

## 2022-02-11 DIAGNOSIS — Z720 Tobacco use: Secondary | ICD-10-CM

## 2022-02-11 NOTE — Assessment & Plan Note (Signed)
Pt continues to smoke, smokes half a pack per day. She has tried patches in the past, she plans to cut back on smoking. Need to quit smoking discussed with pt she verbalized understanding.

## 2022-02-11 NOTE — Progress Notes (Signed)
° °  Jacqueline [name]     MRN: [id number]      DOB: [birth year]   HPI Jacqueline [name] is here for follow up and re-evaluation of chronic medical conditions, medication management and review of any available recent lab and radiology data.  Preventive health is updated, specifically  Cancer screening and Immunization.   Questions or concerns regarding consultations or procedures which the PT has had in the interim are  addressed. The PT denies any adverse reactions to current medications since the last visit.    Pt stated that she had not been able to get the [place] of her breast done due to her daughter been sick. She would like test reordered.     ROS Denies recent fever or chills. Denies sinus pressure, nasal congestion, ear pain or sore throat. Denies chest congestion, productive cough or wheezing. Denies chest pains, palpitations and leg swelling Denies abdominal pain, nausea, vomiting,diarrhea or constipation.   Denies dysuria, frequency, hesitancy or incontinence. Denies joint pain, swelling and limitation in mobility. Denies headaches, seizures, numbness, or tingling. Denies depression, anxiety or insomnia. Denies skin break down or rash.   PE  BP 112/88 (BP Location: Left Arm, Patient Position: Sitting, Cuff Size: Normal)    Pulse 88    Ht 5' (1.524 m)    Wt 179 lb 1.9 oz (81.2 kg)    LMP 02/03/2022 (Approximate) Comment: very light on mood stablizers   SpO2 98%    BMI 34.98 kg/m   Patient alert and oriented and in no cardiopulmonary distress.  Chest: Clear to auscultation bilaterally.  CVS: S1, S2 no murmurs, no S3.Regular rate.  ABD: Soft non tender.   Ext: No edema  MS: Adequate ROM spine, shoulders, hips and knees.  Skin: Intact, no ulcerations or rash noted.  Psych: Good eye contact, normal affect. Memory intact not anxious or depressed appearing.  CNS: CN 2-12 intact, power,  normal throughout.no focal deficits noted.   Assessment & Plan

## 2022-02-11 NOTE — Assessment & Plan Note (Addendum)
01/10/20. Breast US. Result showed  oval circumscribed gently lobulated mass in the left breast at 3 o'clock 4 cm from nipple measures 1.4 x 0.4 x 1.1 cm, .  Bilateral diagnostic mammography with left breast ultrasound was reccommended to be done 6 months after last test.   Has mammogram and [place] of left breast coming up in March.

## 2022-02-11 NOTE — Assessment & Plan Note (Signed)
probably due to smoking. Smoking cessation education completed. Recheck CBC.

## 2022-02-11 NOTE — Assessment & Plan Note (Signed)
Well controlled on meds 

## 2022-02-11 NOTE — Assessment & Plan Note (Signed)
.  Importance of healthy food choices with portion control discussed as well as eating regularly within 12  hour window.   The need to choose clean green food 50%-75% of time is discussed as well as make water the primary drink and set a goal for 64 ounces daily.  Patient reeducated about the importance of committment to minimum of 150 minutes of exercise per week.  Three meals at set times with snacks allowed between meals but they must be fruit or vegetable.   Aim to eat  over 12 hour period  for example 7 am to 7 pm. Stop after your last meal of the day.  Wt Readings from Last 3 Encounters:  02/11/22 179 lb 1.9 oz (81.2 kg)  11/18/21 172 lb (78 kg)  06/20/21 173 lb (78.5 kg)

## 2022-02-11 NOTE — Patient Instructions (Signed)
Pleas get your fasting labs done tomorrow as planned.   is important that you exercise regularly at least 30 minutes 5 times a week.  Think about what you will eat, plan ahead. Choose " clean, green, fresh or frozen" over canned, processed or packaged foods which are more sugary, salty and fatty. 70 to 75% of food eaten should be vegetables and fruit. Three meals at set times with snacks allowed between meals, but they must be fruit or vegetables. Aim to eat over a 12 hour period , example 7 am to 7 pm, and STOP after  your last meal of the day. Drink water,generally about 64 ounces per day, no other drink is as healthy. Fruit juice is best enjoyed in a healthy way, by EATING the fruit.  Thanks for choosing [facility], we consider it a privelige to serve you.

## 2022-02-11 NOTE — Assessment & Plan Note (Signed)
Lab Results  Component Value Date   CHOL 234 (H) 11/18/2021   HDL 44 11/18/2021   LDLCALC 164 (H) 11/18/2021   TRIG 144 11/18/2021   CHOLHDL 4.2 02/23/2020  recheck labs today Eat a healthy diet, including lots of fruits and vegetables. Avoid foods with a lot of saturated and trans fats, such as red meat, butter, fried foods and cheese . Maintain a healthy weight.

## 2022-02-13 DIAGNOSIS — F331 Major depressive disorder, recurrent, moderate: Secondary | ICD-10-CM | POA: Diagnosis not present

## 2022-02-13 DIAGNOSIS — F410 Panic disorder [episodic paroxysmal anxiety] without agoraphobia: Secondary | ICD-10-CM | POA: Diagnosis not present

## 2022-02-13 DIAGNOSIS — F411 Generalized anxiety disorder: Secondary | ICD-10-CM | POA: Diagnosis not present

## 2022-02-18 ENCOUNTER — Ambulatory Visit: Payer: Medicaid Other | Admitting: Nurse Practitioner

## 2022-03-03 ENCOUNTER — Other Ambulatory Visit: Payer: Self-pay

## 2022-03-03 ENCOUNTER — Ambulatory Visit
Admission: RE | Admit: 2022-03-03 | Discharge: 2022-03-03 | Disposition: A | Discharge status: Home / Self Care | Payer: Medicaid Other | Source: Ambulatory Visit | Attending: Family Medicine | Admitting: Family Medicine

## 2022-03-03 ENCOUNTER — Ambulatory Visit (INDEPENDENT_AMBULATORY_CARE_PROVIDER_SITE_OTHER): Payer: Medicaid Other

## 2022-03-03 VITALS — BP 134/82 | HR 85 | Temp 98.6°F | Resp 20

## 2022-03-03 DIAGNOSIS — M25562 Pain in left knee: Secondary | ICD-10-CM | POA: Diagnosis not present

## 2022-03-03 DIAGNOSIS — S8392XA Sprain of unspecified site of left knee, initial encounter: Secondary | ICD-10-CM | POA: Diagnosis not present

## 2022-03-03 MED ORDER — PREDNISONE 20 MG PO TABS: 40.0000 mg | ORAL_TABLET | Freq: Every day | ORAL | 0 refills | Status: DC

## 2022-03-03 NOTE — ED Provider Notes (Signed)
?[facility] ? ? ? ?CSN: [id number] ?Arrival date & time: 03/03/22  1734 ? ? ?  ? ?History   ?Chief Complaint ?Chief Complaint  ?Patient presents with  ? Knee Pain  ? ? ?HPI ?Jacqueline [name] is a 46 y.o. female.  ? ?Presenting today with 3-day history of persisting left anterior knee pain, swelling, redness.  Denies known injury to the area, numbness, tingling, weakness.  So far trying ibuprofen, Tylenol, IcyHot with minimal relief.  No past history of knee issues. ? ? ?Past Medical History:  ?Diagnosis Date  ? Annual visit for general adult medical examination with abnormal findings 02/22/2020  ? Encounter for TB tine test 02/22/2020  ? GERD (gastroesophageal reflux disease)   ? Kidney stones   ? hx UTI  ? Need for immunization against influenza 12/29/2019  ? Sinusitis   ? URI (upper respiratory infection)   ? UTI (lower urinary tract infection)   ? ? ?Patient Active Problem List  ? Diagnosis Date Noted  ? Mixed hyperlipidemia 02/11/2022  ? Polycythemia 02/11/2022  ? Screening mammogram for breast cancer 02/11/2022  ? Annual physical exam 11/18/2021  ? Rash and nonspecific skin eruption 11/18/2021  ? URI (upper respiratory infection) 05/28/2021  ? Sexuality issues 05/09/2021  ? Sciatica of right side 08/08/2020  ? Vitamin D deficiency 03/20/2020  ? Fatigue 02/22/2020  ? Adjustment insomnia 01/25/2020  ? Depression, major, single episode, moderate ([place]) 12/29/2019  ? Anxiety 12/29/2019  ? Left breast mass 12/29/2019  ? Obesity (BMI 30-39.9) 12/29/2019  ? Nicotine abuse 12/29/2019  ? Prehypertension 12/29/2019  ? ? ?Past Surgical History:  ?Procedure Laterality Date  ? DILATION AND CURETTAGE OF UTERUS    ? TUBAL LIGATION    ? ? ?OB History   ? ? Gravida  ?3  ? Para  ?3  ? Term  ?3  ? Preterm  ?   ? AB  ?   ? Living  ?3  ?  ? ? SAB  ?   ? IAB  ?   ? Ectopic  ?   ? Multiple  ?   ? Live Births  ?   ?   ?  ?  ? ? ? ?Home Medications   ? ?Prior to Admission medications   ?Medication Sig Start Date End Date Taking?  Authorizing Provider  ?predniSONE (DELTASONE) 20 MG tablet Take 2 tablets (40 mg total) by mouth daily with breakfast. 03/03/22  Yes Volney American, PA-C  ?ALPRAZolam (XANAX) 0.25 MG tablet Take 0.25 mg by mouth 2 (two) times daily as needed. 07/24/21   [provider]  ?busPIRone (BUSPAR) 5 MG tablet Take 5 mg by mouth 2 (two) times daily. 08/21/21   [provider]  ?lamoTRIgine (LAMICTAL) 25 MG tablet Take 50 mg by mouth daily. 11/15/21   [provider]  ?Karen Kays TO FIND MMR vaccine x 1 11/20/21   Renee Rival, FNP  ? ? ?Family History ?Family History  ?Problem Relation Age of Onset  ? Diabetes Other   ? Heart disease Mother   ? Diabetes Maternal Aunt   ? Cancer Paternal Aunt   ?     lymph node, breast, and ovarian  ? Breast cancer Paternal Aunt   ? Diabetes Maternal Grandmother   ? Heart disease Maternal Grandmother   ? Heart failure Maternal Grandmother   ? Heart attack Maternal Grandfather   ? Cancer Paternal Grandmother   ?     breast cancer  ?  Breast cancer Paternal Grandmother   ? Cancer Paternal Grandfather   ?     lung cancer  ? ? ?Social History ?Social History  ? ?Tobacco Use  ? Smoking status: Every Day  ?  Packs/day: 0.50  ?  Years: 5.00  ?  Pack years: 2.50  ?  Types: Cigarettes  ?  Last attempt to quit: 05/08/2010  ?  Years since quitting: 11.8  ? Smokeless tobacco: Never  ?Vaping Use  ? Vaping Use: Never used  ?Substance Use Topics  ? Alcohol use: No  ? Drug use: No  ? ? ? ?Allergies   ?Penicillins ? ? ?Review of Systems ?Review of Systems ?Per HPI ? ?Physical Exam ?Triage Vital Signs ?ED Triage Vitals  ?Enc Vitals Group  ?   BP 03/03/22 1827 134/82  ?   Pulse Rate 03/03/22 1827 85  ?   Resp 03/03/22 1827 20  ?   Temp 03/03/22 1827 98.6 ?F (37 ?C)  ?   Temp src --   ?   SpO2 03/03/22 1827 96 %  ?   Weight --   ?   Height --   ?   Head Circumference --   ?   Peak Flow --   ?   Pain Score 03/03/22 1825 9  ?   Pain Loc --   ?   Pain Edu? --   ?   Excl. in [place]? --    ? ?No data found. ? ?Updated Vital Signs ?BP 134/82   Pulse 85   Temp 98.6 ?F (37 ?C)   Resp 20   LMP 02/03/2022 (Approximate) Comment: very light on mood stablizers  SpO2 96%  ? ?Visual Acuity ?Right Eye Distance:   ?Left Eye Distance:   ?Bilateral Distance:   ? ?Right Eye Near:   ?Left Eye Near:    ?Bilateral Near:    ? ?Physical Exam ?Vitals and nursing note reviewed.  ?Constitutional:   ?   Appearance: Normal appearance. She is not ill-appearing.  ?HENT:  ?   Head: Atraumatic.  ?Eyes:  ?   Extraocular Movements: Extraocular movements intact.  ?   Conjunctiva/sclera: Conjunctivae normal.  ?Cardiovascular:  ?   Rate and Rhythm: Normal rate and regular rhythm.  ?   Heart sounds: Normal heart sounds.  ?Pulmonary:  ?   Effort: Pulmonary effort is normal.  ?   Breath sounds: Normal breath sounds.  ?Musculoskeletal:     ?   General: Normal range of motion.  ?   Cervical back: Normal range of motion and neck supple.  ?   Comments: Antalgic gait though range of motion intact.  Tenderness to palpation and mild erythema, trace edema to anterior left knee worse on the medial aspect  ?Skin: ?   General: Skin is warm and dry.  ?Neurological:  ?   Mental Status: She is alert and oriented to person, place, and time.  ?   Motor: No weakness.  ?Psychiatric:     ?   Mood and Affect: Mood normal.     ?   Thought Content: Thought content normal.     ?   Judgment: Judgment normal.  ? ? ? ?UC Treatments / Results  ?Labs ?(all labs ordered are listed, but only abnormal results are displayed) ?Labs Reviewed - No data to display ? ?EKG ? ? ?Radiology ?DG Knee Complete 4 Views Left ? ?Result Date: 03/03/2022 ?CLINICAL DATA:  Pain from twisting. EXAM: LEFT KNEE - COMPLETE 4+ VIEW  COMPARISON:  None. FINDINGS: No evidence of fracture, dislocation, or joint effusion. No evidence of arthropathy or other focal bone abnormality. Soft tissues are unremarkable. IMPRESSION: Negative. Electronically Signed   By: Ronney Asters M.D.   On:  03/03/2022 18:42   ? ?Procedures ?Procedures (including critical care time) ? ?Medications Ordered in UC ?Medications - No data to display ? ?Initial Impression / Assessment and Plan / UC Course  ?I have reviewed the triage vital signs and the nursing notes. ? ?Pertinent labs & imaging results that were available during my care of the patient were reviewed by me and considered in my medical decision making (see chart for details). ? ?  ? ?Strain negative for acute bony abnormality of the left knee.  We will treat for inflammatory causes with prednisone, RICE protocol.  Work note given.  Orthopedic follow-up if worsening or not resolving. ? ?Final Clinical Impressions(s) / UC Diagnoses  ? ?Final diagnoses:  ?Acute pain of left knee  ? ?Discharge Instructions   ?None ?  ? ?ED Prescriptions   ? ? Medication Sig Dispense Auth. Provider  ? predniSONE (DELTASONE) 20 MG tablet Take 2 tablets (40 mg total) by mouth daily with breakfast. 10 tablet Volney American, PA-C  ? ?  ? ?PDMP not reviewed this encounter. ?  ?Volney American, PA-C ?03/03/22 1915 ? ?

## 2022-03-03 NOTE — ED Triage Notes (Signed)
Pt presents with left knee pain that began on Friday, unsure if injury , pain is in front of knee  ?

## 2022-03-04 ENCOUNTER — Telehealth: Payer: Self-pay

## 2022-03-04 NOTE — Telephone Encounter (Signed)
Patient calling about lab results. Please return her call. ?

## 2022-03-04 NOTE — Telephone Encounter (Signed)
Called pt no answer left vm 

## 2022-03-06 NOTE — Telephone Encounter (Signed)
Called pt no answer left vm 

## 2022-03-07 NOTE — Telephone Encounter (Signed)
Spoke with pt she was inquiring about results from may saying that something may have been omitted not showing anything  ?

## 2022-03-13 DIAGNOSIS — F410 Panic disorder [episodic paroxysmal anxiety] without agoraphobia: Secondary | ICD-10-CM | POA: Diagnosis not present

## 2022-03-13 DIAGNOSIS — F331 Major depressive disorder, recurrent, moderate: Secondary | ICD-10-CM | POA: Diagnosis not present

## 2022-03-13 DIAGNOSIS — F411 Generalized anxiety disorder: Secondary | ICD-10-CM | POA: Diagnosis not present

## 2022-03-14 ENCOUNTER — Telehealth: Payer: Self-pay | Admitting: Nurse Practitioner

## 2022-03-14 ENCOUNTER — Other Ambulatory Visit: Payer: Self-pay

## 2022-03-14 ENCOUNTER — Encounter: Payer: Self-pay | Admitting: Nurse Practitioner

## 2022-03-14 ENCOUNTER — Ambulatory Visit (INDEPENDENT_AMBULATORY_CARE_PROVIDER_SITE_OTHER): Payer: Medicaid Other | Admitting: Nurse Practitioner

## 2022-03-14 DIAGNOSIS — J019 Acute sinusitis, unspecified: Secondary | ICD-10-CM | POA: Diagnosis not present

## 2022-03-14 MED ORDER — GUAIFENESIN-DM 100-10 MG/5ML PO SYRP
5.0000 mL | ORAL_SOLUTION | ORAL | 0 refills | Status: DC | PRN
Start: 2022-03-14 — End: 2022-06-12

## 2022-03-14 MED ORDER — FLUTICASONE PROPIONATE 50 MCG/ACT NA SUSP: 2.0000 | Freq: Every day | NASAL | 2 refills | Status: DC

## 2022-03-14 MED ORDER — SALINE SPRAY 0.65 % NA SOLN: 1.0000 | NASAL | 0 refills | Status: DC | PRN

## 2022-03-14 NOTE — Telephone Encounter (Signed)
Patient called in requesting antibiotic for upper respiratory infection, ? ?Patient states had one about a month ago , having same symptoms of being stopped up / congested. ? ? ?Patient would like a call back. ?

## 2022-03-14 NOTE — Assessment & Plan Note (Addendum)
Acute rhino sinusitis  ?Flonase nasal spray, use 2 spray into the nose once daily. ?Saline nasal spray .Place 1 spray into both nostrils as needed for congestion ?Take Tylenol 650 mg every 6 hours as needed for fever ?Robitussin Take 5 mLs by mouth every 4 (four) hours as needed for cough.Marland Kitchen ?Patient told to call the office if her symptoms does not get better in a week ?

## 2022-03-14 NOTE — Telephone Encounter (Signed)
Scheduled telephone visit per fola ok ?

## 2022-03-14 NOTE — Telephone Encounter (Signed)
Pt seen 2/14 please advise ?

## 2022-03-14 NOTE — Progress Notes (Signed)
Virtual Visit via Telephone Note ? ?I connected with Jacqueline [name] @ on 03/14/22 at 4.01pm by telephone and verified that I am speaking with the correct person using two identifiers.  I spent 6 minutes on this telephone encounter ? ?Location: ?Patient: home  ?Provider: office ?  ?I discussed the limitations, risks, security and privacy concerns of performing an evaluation and management service by telephone and the availability of in person appointments. I also discussed with the patient that there may be a patient responsible charge related to this service. The patient expressed understanding and agreed to proceed. ? ? 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 Pt denies chest pain, wheezing shortness of breath bloody sputum, states that her body hurts when she coughs,she has taken sudafed but its not helping. She has not had flu vaccines and COVID booster.  Patient states that she will not be able to make it to the clinic for COVID and flu test.  ?  ?Observations/Objective: ? ? ?Assessment and Plan: ?Acute rhino sinusitis  ?Flonase nasal spray, use 2 spray into the nose once daily. ?Saline nasal spray .Place 1 spray into both nostrils as needed for congestion ?Take Tylenol 650 mg every 6 hours as needed for fever ?Robitussin Take 5 mLs by mouth every 4 (four) hours as needed for cough ?Patient told to call the office if her symptoms does not get better in a week ?Follow Up Instructions: ? ?  ?I discussed the assessment and treatment plan with the patient. The patient was provided an opportunity to ask questions and all were answered. The patient agreed with the plan and demonstrated an understanding of the instructions. ?  ?The patient was advised to call back or seek an in-person evaluation  if the symptoms worsen or if the condition fails to improve as anticipated.  ?

## 2022-03-18 ENCOUNTER — Ambulatory Visit (HOSPITAL_COMMUNITY)
Admission: RE | Admit: 2022-03-18 | Discharge: 2022-03-18 | Disposition: A | Discharge status: Home / Self Care | Payer: Medicaid Other | Source: Ambulatory Visit | Attending: Nurse Practitioner | Admitting: Nurse Practitioner

## 2022-03-18 ENCOUNTER — Other Ambulatory Visit: Payer: Self-pay

## 2022-03-18 DIAGNOSIS — N6320 Unspecified lump in the left breast, unspecified quadrant: Secondary | ICD-10-CM

## 2022-03-18 DIAGNOSIS — R928 Other abnormal and inconclusive findings on diagnostic imaging of breast: Secondary | ICD-10-CM | POA: Diagnosis not present

## 2022-03-25 ENCOUNTER — Encounter: Payer: Self-pay | Admitting: Internal Medicine

## 2022-03-25 ENCOUNTER — Ambulatory Visit (INDEPENDENT_AMBULATORY_CARE_PROVIDER_SITE_OTHER): Payer: Medicaid Other | Admitting: Internal Medicine

## 2022-03-25 ENCOUNTER — Other Ambulatory Visit: Payer: Self-pay

## 2022-03-25 DIAGNOSIS — J019 Acute sinusitis, unspecified: Secondary | ICD-10-CM | POA: Diagnosis not present

## 2022-03-25 MED ORDER — AZITHROMYCIN 250 MG PO TABS: ORAL_TABLET | ORAL | 0 refills | Status: AC

## 2022-03-25 NOTE — Progress Notes (Signed)
?  ? ?Virtual Visit via Telephone Note  ? ?This visit type was conducted due to national recommendations for restrictions regarding the COVID-19 Pandemic (e.g. social distancing) in an effort to limit this patient's exposure and mitigate transmission in our community.  Due to her co-morbid illnesses, this patient is at least at moderate risk for complications without adequate follow up.  This format is felt to be most appropriate for this patient at this time.  The patient did not have access to video technology/had technical difficulties with video requiring transitioning to audio format only (telephone).  All issues noted in this document were discussed and addressed.  No physical exam could be performed with this format. ? ?Evaluation Performed:  Follow-up visit ? ?Date:  03/25/2022  ? ?ID:  Jacqueline [name], DOB 10-01-1976, MRN [id number] ? ?Patient Location: Home ?Provider Location: Office/Clinic ? ?Participants: Patient ?Location of Patient: Home ?Location of Provider: Telehealth ?Consent was obtain for visit to be over via telehealth. ?I verified that I am speaking with the correct person using two identifiers. ? ?PCP:  Renee Rival, FNP  ? ?Chief Complaint: Nasal congestion and sinus pain ? ?History of Present Illness:   ? ?Jacqueline [name] is a 46 y.o. female who has a televisit for complaint of nasal congestion and sinus pressure for the last 10 days.  She started having left-sided facial tenderness with pressure, left ear pain and fever since yesterday.  She currently denies any dyspnea or wheezing.  She was given Flonase in the last week, which helped with nasal congestion at that time. ? ?The patient does not have symptoms concerning for COVID-19 infection (fever, chills, cough, or new shortness of breath).  ? ?Past Medical, Surgical, Social History, Allergies, and Medications have been Reviewed. ? ?Past Medical History:  ?Diagnosis Date  ? Annual visit for general adult medical examination with abnormal  findings 02/22/2020  ? Encounter for TB tine test 02/22/2020  ? GERD (gastroesophageal reflux disease)   ? Kidney stones   ? hx UTI  ? Need for immunization against influenza 12/29/2019  ? Sinusitis   ? URI (upper respiratory infection)   ? UTI (lower urinary tract infection)   ? ?Past Surgical History:  ?Procedure Laterality Date  ? DILATION AND CURETTAGE OF UTERUS    ? TUBAL LIGATION    ?  ? ?Current Meds  ?Medication Sig  ? ALPRAZolam (XANAX) 0.25 MG tablet Take 0.25 mg by mouth 2 (two) times daily as needed.  ? busPIRone (BUSPAR) 5 MG tablet Take 5 mg by mouth 2 (two) times daily.  ? fluticasone (FLONASE) 50 MCG/ACT nasal spray Place 2 sprays into both nostrils daily.  ? guaiFENesin-dextromethorphan (ROBITUSSIN DM) 100-10 MG/5ML syrup Take 5 mLs by mouth every 4 (four) hours as needed for cough.  ? lamoTRIgine (LAMICTAL) 25 MG tablet Take 50 mg by mouth daily.  ? sodium chloride (OCEAN) 0.65 % SOLN nasal spray Place 1 spray into both nostrils as needed for congestion.  ? traZODone (DESYREL) 50 MG tablet Take 50 mg by mouth at bedtime.  ? UNABLE TO FIND MMR vaccine x 1  ?  ? ?Allergies:   Penicillins  ? ?ROS:   ?Please see the history of present illness.    ? ?All other systems reviewed and are negative. ? ? ?Labs/Other Tests and Data Reviewed:   ? ?Recent Labs: ?11/18/2021: ALT 15; BUN 8; Creatinine, Ser 0.70; Hemoglobin 16.9; Platelets 364; Potassium 4.6; Sodium 139; TSH 1.480  ? ?Recent Lipid Panel ?  Lab Results  ?Component Value Date/Time  ? CHOL 234 (H) 11/18/2021 11:11 AM  ? TRIG 144 11/18/2021 11:11 AM  ? HDL 44 11/18/2021 11:11 AM  ? CHOLHDL 4.2 02/23/2020 08:03 AM  ? LDLCALC 164 (H) 11/18/2021 11:11 AM  ? LDLCALC 128 (H) 02/23/2020 08:03 AM  ? ? ?Wt Readings from Last 3 Encounters:  ?02/11/22 179 lb 1.9 oz (81.2 kg)  ?11/18/21 172 lb (78 kg)  ?06/20/21 173 lb (78.5 kg)  ?  ? ?ASSESSMENT & PLAN:   ? ?Acute sinusitis ?Started empiric azithromycin as she has persistent symptoms despite symptomatic  treatment ?Has completed prednisone recently ?Continue Mucinex or Robitussin as needed for cough ?Continue Flonase for allergies ? ?Time:   ?Today, I have spent 9 minutes reviewing the chart, including problem list, medications, and with the patient with telehealth technology discussing the above problems. ? ? ?Medication Adjustments/Labs and Tests Ordered: ?Current medicines are reviewed at length with the patient today.  Concerns regarding medicines are outlined above.  ? ?Tests Ordered: ?No orders of the defined types were placed in this encounter. ? ? ?Medication Changes: ?No orders of the defined types were placed in this encounter. ? ? ? ?Note: This dictation was prepared with Dragon dictation along with smaller phrase technology. Similar sounding words can be transcribed inadequately or may not be corrected upon review. Any transcriptional errors that result from this process are unintentional.  ?  ? ? ?Disposition:  Follow up  ?Signed, ?Lindell Spar, MD  ?03/25/2022 8:19 AM    ? ?[facility] ?

## 2022-04-08 DIAGNOSIS — F4381 Prolonged grief disorder: Secondary | ICD-10-CM | POA: Diagnosis not present

## 2022-04-08 DIAGNOSIS — F341 Dysthymic disorder: Secondary | ICD-10-CM | POA: Diagnosis not present

## 2022-04-08 DIAGNOSIS — F411 Generalized anxiety disorder: Secondary | ICD-10-CM | POA: Diagnosis not present

## 2022-04-08 DIAGNOSIS — F4310 Post-traumatic stress disorder, unspecified: Secondary | ICD-10-CM | POA: Diagnosis not present

## 2022-04-09 DIAGNOSIS — F410 Panic disorder [episodic paroxysmal anxiety] without agoraphobia: Secondary | ICD-10-CM | POA: Diagnosis not present

## 2022-04-09 DIAGNOSIS — F331 Major depressive disorder, recurrent, moderate: Secondary | ICD-10-CM | POA: Diagnosis not present

## 2022-04-09 DIAGNOSIS — F411 Generalized anxiety disorder: Secondary | ICD-10-CM | POA: Diagnosis not present

## 2022-04-14 DIAGNOSIS — F4381 Prolonged grief disorder: Secondary | ICD-10-CM | POA: Diagnosis not present

## 2022-04-14 DIAGNOSIS — F341 Dysthymic disorder: Secondary | ICD-10-CM | POA: Diagnosis not present

## 2022-04-14 DIAGNOSIS — F4310 Post-traumatic stress disorder, unspecified: Secondary | ICD-10-CM | POA: Diagnosis not present

## 2022-04-14 DIAGNOSIS — F411 Generalized anxiety disorder: Secondary | ICD-10-CM | POA: Diagnosis not present

## 2022-04-18 DIAGNOSIS — F411 Generalized anxiety disorder: Secondary | ICD-10-CM | POA: Diagnosis not present

## 2022-04-18 DIAGNOSIS — F331 Major depressive disorder, recurrent, moderate: Secondary | ICD-10-CM | POA: Diagnosis not present

## 2022-04-18 DIAGNOSIS — F410 Panic disorder [episodic paroxysmal anxiety] without agoraphobia: Secondary | ICD-10-CM | POA: Diagnosis not present

## 2022-04-20 DIAGNOSIS — F411 Generalized anxiety disorder: Secondary | ICD-10-CM | POA: Diagnosis not present

## 2022-04-20 DIAGNOSIS — F4310 Post-traumatic stress disorder, unspecified: Secondary | ICD-10-CM | POA: Diagnosis not present

## 2022-04-20 DIAGNOSIS — F4381 Prolonged grief disorder: Secondary | ICD-10-CM | POA: Diagnosis not present

## 2022-04-20 DIAGNOSIS — F341 Dysthymic disorder: Secondary | ICD-10-CM | POA: Diagnosis not present

## 2022-04-22 DIAGNOSIS — F411 Generalized anxiety disorder: Secondary | ICD-10-CM | POA: Diagnosis not present

## 2022-04-22 DIAGNOSIS — F410 Panic disorder [episodic paroxysmal anxiety] without agoraphobia: Secondary | ICD-10-CM | POA: Diagnosis not present

## 2022-04-22 DIAGNOSIS — F331 Major depressive disorder, recurrent, moderate: Secondary | ICD-10-CM | POA: Diagnosis not present

## 2022-05-05 DIAGNOSIS — F341 Dysthymic disorder: Secondary | ICD-10-CM | POA: Diagnosis not present

## 2022-05-05 DIAGNOSIS — F411 Generalized anxiety disorder: Secondary | ICD-10-CM | POA: Diagnosis not present

## 2022-05-05 DIAGNOSIS — F4310 Post-traumatic stress disorder, unspecified: Secondary | ICD-10-CM | POA: Diagnosis not present

## 2022-05-05 DIAGNOSIS — F4381 Prolonged grief disorder: Secondary | ICD-10-CM | POA: Diagnosis not present

## 2022-05-07 DIAGNOSIS — F331 Major depressive disorder, recurrent, moderate: Secondary | ICD-10-CM | POA: Diagnosis not present

## 2022-05-07 DIAGNOSIS — F410 Panic disorder [episodic paroxysmal anxiety] without agoraphobia: Secondary | ICD-10-CM | POA: Diagnosis not present

## 2022-05-07 DIAGNOSIS — F411 Generalized anxiety disorder: Secondary | ICD-10-CM | POA: Diagnosis not present

## 2022-05-07 IMAGING — DX DG KNEE COMPLETE 4+V*L*
4 series · 4 of 4 positions shown · non-contrast
Comparison: None.

CLINICAL DATA: Pain from twisting.

EXAM:
LEFT KNEE - COMPLETE 4+ VIEW

[knee ap]
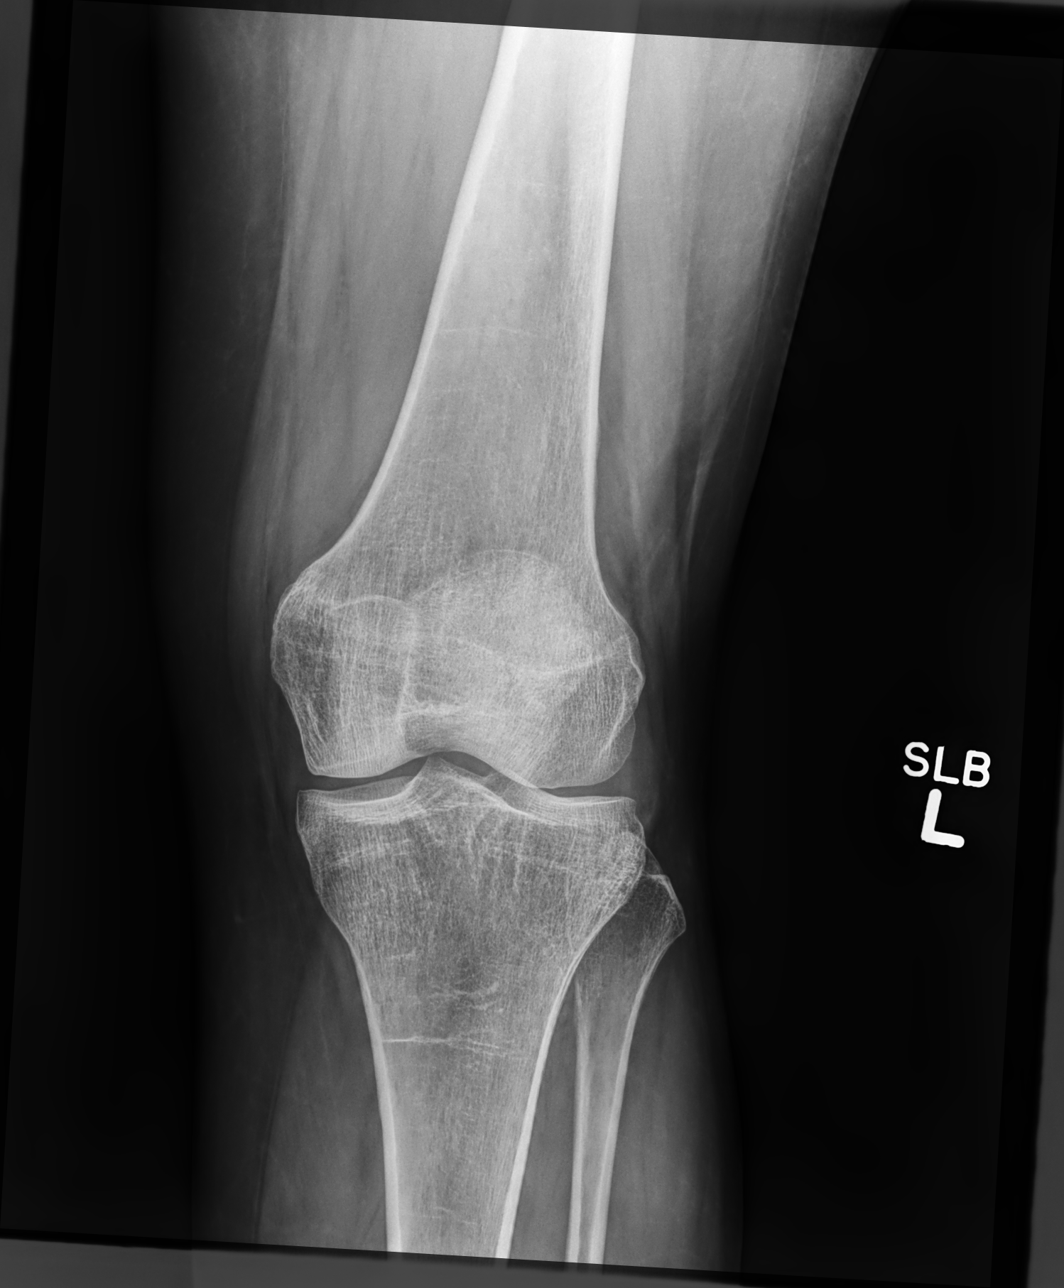

[knee mlo (1 of 2)]
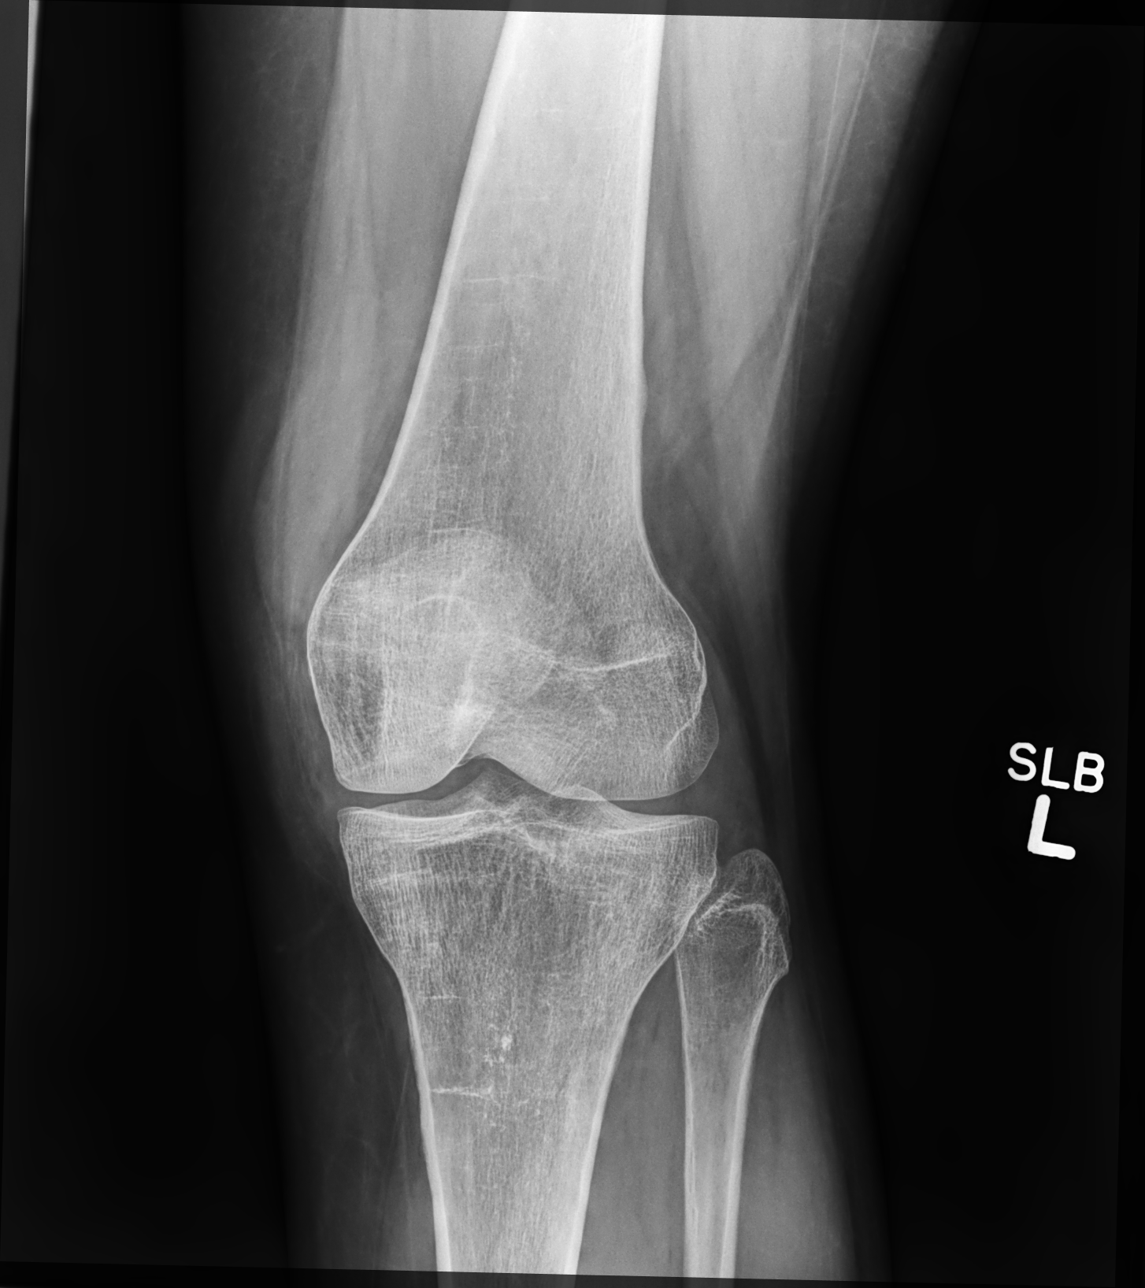

[knee mlo (2 of 2)]
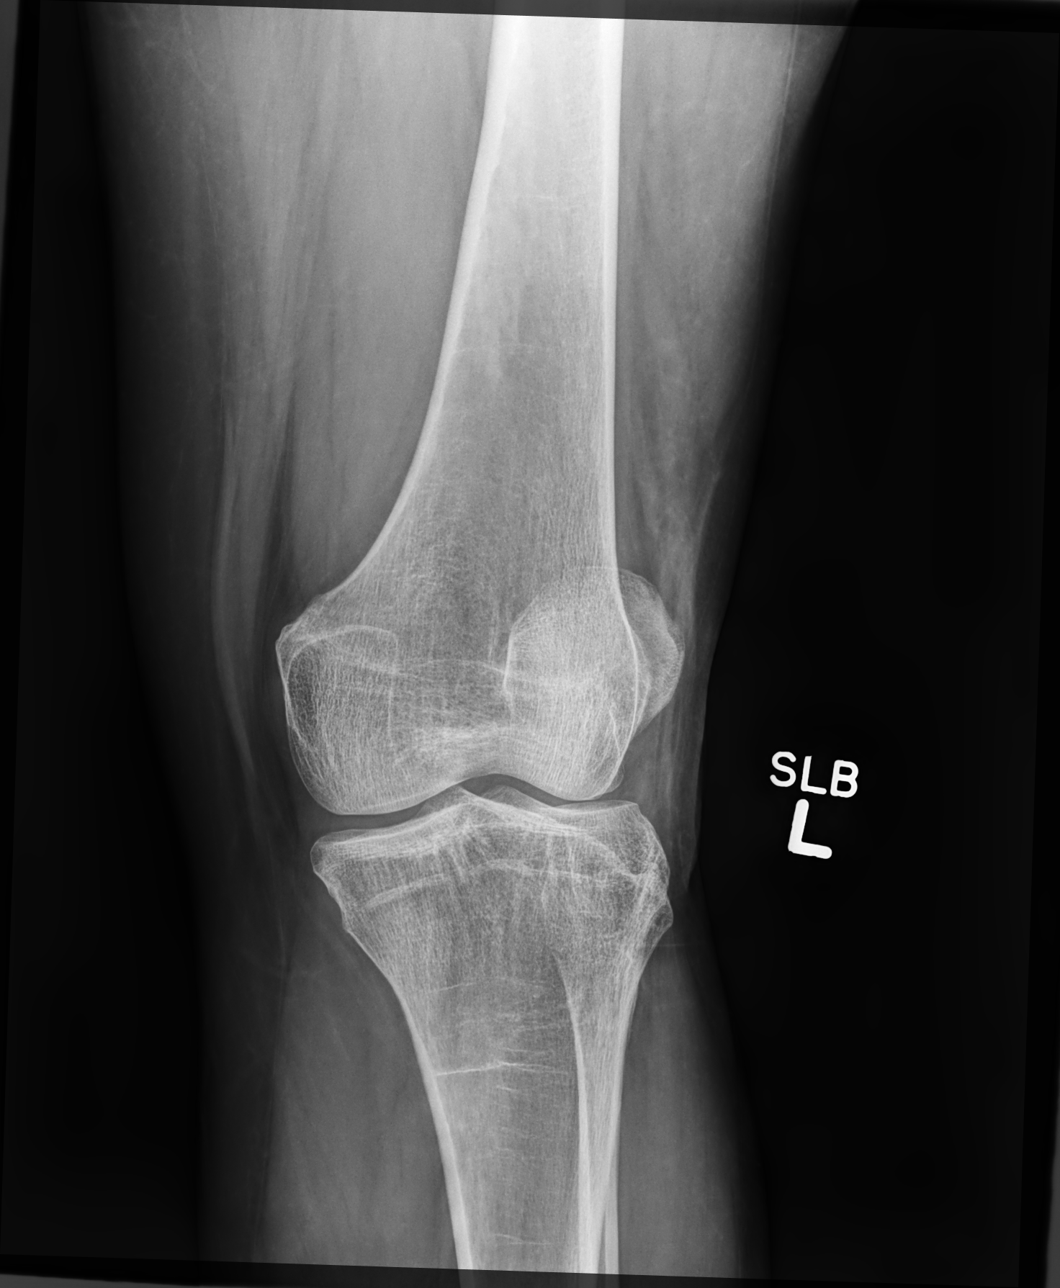

[knee lat]
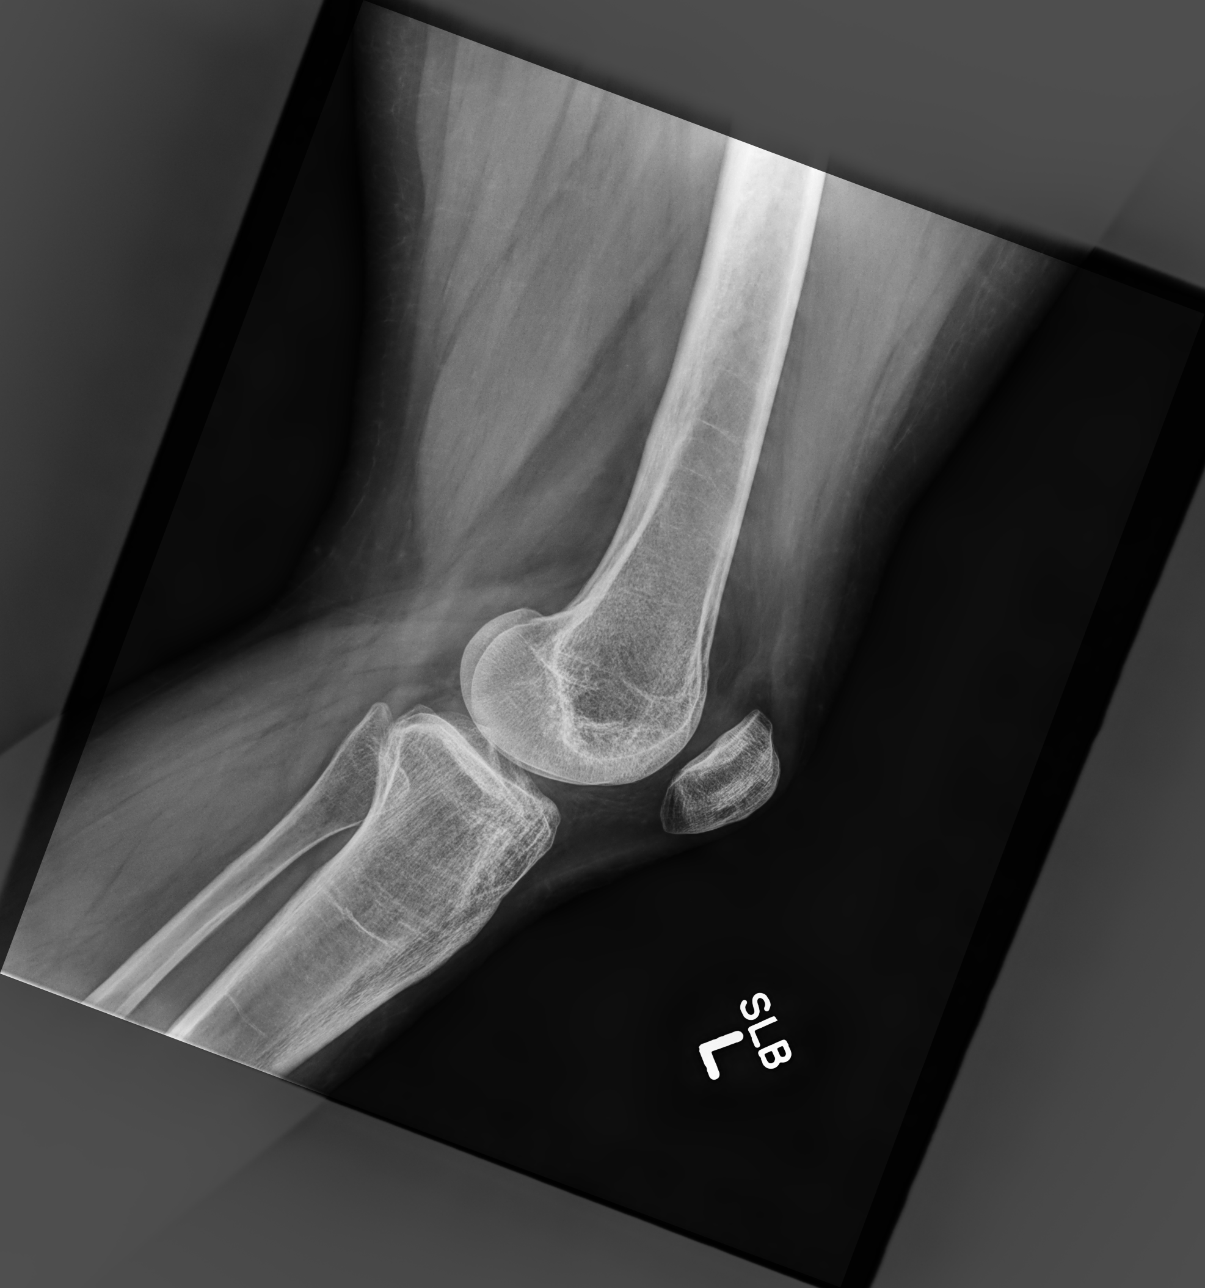

[4 of 4 positions shown; findings below may reference images not displayed]

FINDINGS: No evidence of fracture, dislocation, or joint effusion. No evidence
of arthropathy or other focal bone abnormality. Soft tissues are
unremarkable.
IMPRESSION: Negative.

## 2022-05-19 DIAGNOSIS — F4381 Prolonged grief disorder: Secondary | ICD-10-CM | POA: Diagnosis not present

## 2022-05-19 DIAGNOSIS — F4310 Post-traumatic stress disorder, unspecified: Secondary | ICD-10-CM | POA: Diagnosis not present

## 2022-05-19 DIAGNOSIS — F411 Generalized anxiety disorder: Secondary | ICD-10-CM | POA: Diagnosis not present

## 2022-05-19 DIAGNOSIS — F341 Dysthymic disorder: Secondary | ICD-10-CM | POA: Diagnosis not present

## 2022-06-02 DIAGNOSIS — F4381 Prolonged grief disorder: Secondary | ICD-10-CM | POA: Diagnosis not present

## 2022-06-02 DIAGNOSIS — F411 Generalized anxiety disorder: Secondary | ICD-10-CM | POA: Diagnosis not present

## 2022-06-02 DIAGNOSIS — F4312 Post-traumatic stress disorder, chronic: Secondary | ICD-10-CM | POA: Diagnosis not present

## 2022-06-05 DIAGNOSIS — F331 Major depressive disorder, recurrent, moderate: Secondary | ICD-10-CM | POA: Diagnosis not present

## 2022-06-05 DIAGNOSIS — F410 Panic disorder [episodic paroxysmal anxiety] without agoraphobia: Secondary | ICD-10-CM | POA: Diagnosis not present

## 2022-06-05 DIAGNOSIS — F411 Generalized anxiety disorder: Secondary | ICD-10-CM | POA: Diagnosis not present

## 2022-06-11 ENCOUNTER — Other Ambulatory Visit: Payer: Self-pay

## 2022-06-11 ENCOUNTER — Ambulatory Visit
Admission: RE | Admit: 2022-06-11 | Discharge: 2022-06-11 | Disposition: A | Discharge status: Home / Self Care | Payer: Medicaid Other | Source: Ambulatory Visit | Attending: Nurse Practitioner | Admitting: Nurse Practitioner

## 2022-06-11 VITALS — BP 157/96 | HR 89 | Temp 98.5°F | Resp 18

## 2022-06-11 DIAGNOSIS — J039 Acute tonsillitis, unspecified: Secondary | ICD-10-CM | POA: Diagnosis not present

## 2022-06-11 DIAGNOSIS — H938X3 Other specified disorders of ear, bilateral: Secondary | ICD-10-CM

## 2022-06-11 LAB — POCT RAPID STREP A (OFFICE): Rapid Strep A Screen: NEGATIVE

## 2022-06-11 NOTE — ED Triage Notes (Signed)
Pt reports bilateral ear fullness, sinus pressure for last several days. Pt reports Monday night felt "knot" to right side of throat/neck and reports pain with swallowing. Pt denies any known fevers.

## 2022-06-11 NOTE — ED Provider Notes (Signed)
[facility]    CSN: [id number] Arrival date & time: 06/11/22  1413      History   Chief Complaint Chief Complaint  Patient presents with   Appointment    1430   Ear Pain    Tender lymph node or gland on right side of neck. Sinus pressure - Entered by patient    HPI Jacqueline [name] is a 46 y.o. female.   Patient presents for bilateral ear fullness, left sinus pain that is now resolved, sore throat, and swollen down on the right side that are both now resolved.  Symptoms since Monday.  She denies fever, body aches, chills, congested cough.  She does think she has some postnasal drainage.  She denies headache, abdominal pain, nausea/vomiting, diarrhea, decreased appetite, fatigue.  No known sick contacts.  Over-the-counter medications have not been helpful.      Past Medical History:  Diagnosis Date   Annual visit for general adult medical examination with abnormal findings 02/22/2020   Encounter for TB tine test 02/22/2020   GERD (gastroesophageal reflux disease)    Kidney stones    hx UTI   Need for immunization against influenza 12/29/2019   Sinusitis    URI (upper respiratory infection)    UTI (lower urinary tract infection)     Patient Active Problem List   Diagnosis Date Noted   Acute rhinosinusitis 03/14/2022   Mixed hyperlipidemia 02/11/2022   Polycythemia 02/11/2022   Screening mammogram for breast cancer 02/11/2022   Annual physical exam 11/18/2021   Rash and nonspecific skin eruption 11/18/2021   URI (upper respiratory infection) 05/28/2021   Sexuality issues 05/09/2021   Sciatica of right side 08/08/2020   Vitamin D deficiency 03/20/2020   Fatigue 02/22/2020   Adjustment insomnia 01/25/2020   Depression, major, single episode, moderate ([place]) 12/29/2019   Anxiety 12/29/2019   Left breast mass 12/29/2019   Obesity (BMI 30-39.9) 12/29/2019   Nicotine abuse 12/29/2019   Prehypertension 12/29/2019    Past Surgical History:  Procedure  Laterality Date   DILATION AND CURETTAGE OF UTERUS     TUBAL LIGATION      OB History     Gravida  3   Para  3   Term  3   Preterm      AB      Living  3      SAB      IAB      Ectopic      Multiple      Live Births               Home Medications    Prior to Admission medications   Medication Sig Start Date End Date Taking? Authorizing Provider  ALPRAZolam (XANAX) 0.25 MG tablet Take 0.25 mg by mouth 2 (two) times daily as needed. 07/24/21   [provider]  busPIRone (BUSPAR) 5 MG tablet Take 5 mg by mouth 2 (two) times daily. 08/21/21   [provider]  fluticasone (FLONASE) 50 MCG/ACT nasal spray Place 2 sprays into both nostrils daily. 03/14/22   Paseda, Dewaine Conger, FNP  guaiFENesin-dextromethorphan (ROBITUSSIN DM) 100-10 MG/5ML syrup Take 5 mLs by mouth every 4 (four) hours as needed for cough. 03/14/22   Renee Rival, FNP  lamoTRIgine (LAMICTAL) 25 MG tablet Take 50 mg by mouth daily. 11/15/21   [provider]  sodium chloride (OCEAN) 0.65 % SOLN nasal spray Place 1 spray into both nostrils as needed for congestion. 03/14/22  Renee Rival, FNP  traZODone (DESYREL) 50 MG tablet Take 50 mg by mouth at bedtime. 03/13/22   [provider]  UNABLE TO FIND MMR vaccine x 1 11/20/21   Paseda, Dewaine Conger, FNP    Family History Family History  Problem Relation Age of Onset   Diabetes Other    Heart disease Mother    Diabetes Maternal Aunt    Cancer Paternal Aunt        lymph node, breast, and ovarian   Breast cancer Paternal Aunt    Diabetes Maternal Grandmother    Heart disease Maternal Grandmother    Heart failure Maternal Grandmother    Heart attack Maternal Grandfather    Cancer Paternal Grandmother        breast cancer   Breast cancer Paternal Grandmother    Cancer Paternal Grandfather        lung cancer    Social History Social History   Tobacco Use   Smoking status: Every Day    Packs/day:  0.50    Years: 5.00    Total pack years: 2.50    Types: Cigarettes    Last attempt to quit: 05/08/2010    Years since quitting: 12.1   Smokeless tobacco: Never  Vaping Use   Vaping Use: Never used  Substance Use Topics   Alcohol use: No   Drug use: No     Allergies   Penicillins   Review of Systems Review of Systems Per HPI  Physical Exam Triage Vital Signs ED Triage Vitals  Enc Vitals Group     BP 06/11/22 1518 (!) 157/96     Pulse Rate 06/11/22 1518 89     Resp 06/11/22 1518 18     Temp 06/11/22 1518 98.5 F (36.9 C)     Temp Source 06/11/22 1518 Oral     SpO2 06/11/22 1518 96 %     Weight --      Height --      Head Circumference --      Peak Flow --      Pain Score 06/11/22 1519 3     Pain Loc --      Pain Edu? --      Excl. in [place]? --    No data found.  Updated Vital Signs BP (!) 157/96 (BP Location: Right Arm)   Pulse 89   Temp 98.5 F (36.9 C) (Oral)   Resp 18   SpO2 96%   Visual Acuity Right Eye Distance:   Left Eye Distance:   Bilateral Distance:    Right Eye Near:   Left Eye Near:    Bilateral Near:     Physical Exam Vitals and nursing note reviewed.  Constitutional:      General: She is not in acute distress.    Appearance: Normal appearance. She is not ill-appearing or toxic-appearing.  HENT:     Head: Normocephalic and atraumatic.     Right Ear: Ear canal and external ear normal. A middle ear effusion is present. Tympanic membrane is bulging. Tympanic membrane is not injected.     Left Ear: Ear canal and external ear normal. A middle ear effusion is present. Tympanic membrane is bulging. Tympanic membrane is not injected.     Nose: Congestion present. No rhinorrhea.     Mouth/Throat:     Mouth: Mucous membranes are moist.     Pharynx: Oropharynx is clear. Posterior oropharyngeal erythema present. No oropharyngeal exudate.     Tonsils: No  tonsillar exudate. 2+ on the right. 2+ on the left.  Eyes:     General: No scleral icterus.     Extraocular Movements: Extraocular movements intact.  Cardiovascular:     Rate and Rhythm: Normal rate and regular rhythm.  Pulmonary:     Effort: Pulmonary effort is normal. No respiratory distress.     Breath sounds: Normal breath sounds. No wheezing, rhonchi or rales.  Abdominal:     General: Abdomen is flat. Bowel sounds are normal. There is no distension.     Palpations: Abdomen is soft.  Musculoskeletal:     Cervical back: Normal range of motion and neck supple.  Lymphadenopathy:     Cervical: No cervical adenopathy.  Skin:    General: Skin is warm and dry.     Coloration: Skin is not jaundiced or pale.     Findings: No erythema or rash.  Neurological:     Mental Status: She is alert and oriented to person, place, and time.     Motor: No weakness.  Psychiatric:        Mood and Affect: Mood normal.        Behavior: Behavior normal.      UC Treatments / Results  Labs (all labs ordered are listed, but only abnormal results are displayed) Labs Reviewed  CULTURE, GROUP A STREP [facility])  POCT RAPID STREP A (OFFICE)    EKG   Radiology No results found.  Procedures Procedures (including critical care time)  Medications Ordered in UC Medications - No data to display  Initial Impression / Assessment and Plan / UC Course  I have reviewed the triage vital signs and the nursing notes.  Pertinent labs & imaging results that were available during my care of the patient were reviewed by me and considered in my medical decision making (see chart for details).    Symptoms are consistent with an acute viral illness.  Given enlarged tonsils, erythema, strep swab obtained and was negative.  Will send for throat culture and treat with azithromycin given penicillin allergy if positive.  Regarding bilateral middle ear effusion, start Flonase nasal spray daily.  We discussed that lymphadenopathy is common in reactive disease like an acute viral illness.  Encouraged supportive care.   Seek care if symptoms persist or worsen despite treatment. Final Clinical Impressions(s) / UC Diagnoses   Final diagnoses:  Pressure sensation in both ears  Tonsillitis     Discharge Instructions      - Rapid strep throat test today is negative; we will send for a culture and let you know if this comes back positive - Your symptoms are most likely related to a virus.  Please start supportive care including Flonase to help with the ear pressure.  Make sure you are drinking plenty of fluids.  Seek care if your symptoms persist or worsen.     ED Prescriptions   None    PDMP not reviewed this encounter.   Eulogio Bear, NP 06/11/22 [phone]

## 2022-06-11 NOTE — Discharge Instructions (Addendum)
-   Rapid strep throat test today is negative; we will send for a culture and let you know if this comes back positive - Your symptoms are most likely related to a virus.  Please start supportive care including Flonase to help with the ear pressure.  Make sure you are drinking plenty of fluids.  Seek care if your symptoms persist or worsen.

## 2022-06-12 ENCOUNTER — Encounter: Payer: Self-pay | Admitting: Nurse Practitioner

## 2022-06-12 ENCOUNTER — Ambulatory Visit (INDEPENDENT_AMBULATORY_CARE_PROVIDER_SITE_OTHER): Payer: Medicaid Other | Admitting: Nurse Practitioner

## 2022-06-12 DIAGNOSIS — E782 Mixed hyperlipidemia: Secondary | ICD-10-CM | POA: Diagnosis not present

## 2022-06-12 DIAGNOSIS — R300 Dysuria: Secondary | ICD-10-CM | POA: Diagnosis not present

## 2022-06-12 DIAGNOSIS — D751 Secondary polycythemia: Secondary | ICD-10-CM | POA: Diagnosis not present

## 2022-06-12 LAB — POCT URINALYSIS DIP (CLINITEK)
Bilirubin, UA: NEGATIVE
Glucose, UA: NEGATIVE mg/dL
Ketones, POC UA: NEGATIVE mg/dL
Leukocytes, UA: NEGATIVE
Nitrite, UA: NEGATIVE
POC PROTEIN,UA: NEGATIVE
Spec Grav, UA: 1.025 (ref 1.010–1.025)
Urobilinogen, UA: 0.2 E.U./dL
pH, UA: 6 (ref 5.0–8.0)

## 2022-06-12 NOTE — Assessment & Plan Note (Signed)
Currently denies painful urination stated that she had some pressure in her bladder last night Denies fever chills malaise hesitancy POCT urinalysis showed moderate blood, negative ketones and leukocytes. Urine sent to the lab for urinalysis and culture. Patient told to drink at least 64 ounces of water daily to maintain hydration. There is low likelihood that the patient has UTI Blood noted in urine could be from her menstruation

## 2022-06-12 NOTE — Progress Notes (Signed)
Virtual Visit via Telephone Note  I connected with [employer] @ on 06/12/22 at 1:08 p.m. by telephone and verified that I am speaking with the correct person using two identifiers.  I spent 8 minutes on this telephone encounter  Location: Patient: home Provider: office   I discussed the limitations, risks, security and privacy concerns of performing an evaluation and management service by telephone and the availability of in person appointments. I also discussed with the patient that there may be a patient responsible charge related to this service. The patient expressed understanding and agreed to proceed.   History of Present Illness: Jacqueline [name] with past medical history of sciatica of right side, mixed hyperlipidemia, polycythemia c/o pressure in her bladder that she first noticed when she woke up last night to urinate, has a funny feelings in her lower back,  had a little blood noted when she wiped her vagina area, she took 2 azole pills today . Denies fever, chills, frequency, vaginitis, hesitancy. Does not have regular menstruations, States that I do not feel as bad as I did yesterday     Observations/Objective:   Assessment and Plan: Dysuria Currently denies painful urination stated that she had some pressure in her bladder last night Denies fever chills malaise hesitancy POCT urinalysis showed moderate blood, negative ketones and leukocytes. Urine sent to the lab for urinalysis and culture. Patient told to drink at least 64 ounces of water daily to maintain hydration. There is low likelihood that the patient has UTI Blood noted in urine could be from her menstruation   Follow Up Instructions:    I discussed the assessment and treatment plan with the patient. The patient was provided an opportunity to ask questions and all were answered. The patient agreed with the plan and demonstrated an understanding of the instructions.   The patient was advised to call back or seek an  in-person evaluation if the symptoms worsen or if the condition fails to improve as anticipated.

## 2022-06-13 ENCOUNTER — Telehealth: Payer: Self-pay | Admitting: Nurse Practitioner

## 2022-06-13 LAB — CBC
Hematocrit: 44.1 % (ref 34.0–46.6)
Hemoglobin: 14.9 g/dL (ref 11.1–15.9)
MCH: 28.5 pg (ref 26.6–33.0)
MCHC: 33.8 g/dL (ref 31.5–35.7)
MCV: 85 fL (ref 79–97)
Platelets: 365 10*3/uL (ref 150–450)
RBC: 5.22 x10E6/uL (ref 3.77–5.28)
RDW: 13.7 % (ref 11.7–15.4)
WBC: 13.1 10*3/uL — ABNORMAL HIGH (ref 3.4–10.8)

## 2022-06-13 LAB — LIPID PANEL
Chol/HDL Ratio: 5.2 ratio — ABNORMAL HIGH (ref 0.0–4.4)
Cholesterol, Total: 213 mg/dL — ABNORMAL HIGH (ref 100–199)
HDL: 41 mg/dL (ref >39)
LDL Chol Calc (NIH): 151 mg/dL — ABNORMAL HIGH (ref 0–99)
Triglycerides: 117 mg/dL (ref 0–149)
VLDL Cholesterol Cal: 21 mg/dL (ref 5–40)

## 2022-06-13 NOTE — Telephone Encounter (Signed)
Pt was bite by tick Sunday and wants to know if this is the cause of her blood work showing up like it is. She states that she is feeling much better.  Please advise

## 2022-06-13 NOTE — Telephone Encounter (Signed)
Spoke with pt advised of Fola's message scheduled for 7/22

## 2022-06-13 NOTE — Telephone Encounter (Signed)
Patient called in regard to lab results.  Also just realized had been bitten by a tick and wants to know if lab result  could stem form tick bite.  Wants a call back in regard.

## 2022-06-13 NOTE — Progress Notes (Signed)
We will discuss the need for statin at her next visit if her cholesterol labs remains high

## 2022-06-13 NOTE — Progress Notes (Signed)
LDL is improved but remains elevated, avoid fried fatty foods, engage in regular daily exercises at least 150 minutes weekly, avoid smoking,  W BC slightly elevated, we are still waiting on her urine culture and UA   The 10-year ASCVD risk score (Arnett DK, et al., 2019) is: 7%   Values used to calculate the score:     Age: 46 years     Sex: Female     Is Non-Hispanic African American: No     Diabetic: No     Tobacco smoker: Yes     Systolic Blood Pressure: 157 mmHg     Is BP treated: No     HDL Cholesterol: 41 mg/dL     Total Cholesterol: 213 mg/dL

## 2022-06-14 ENCOUNTER — Telehealth: Payer: Self-pay | Admitting: Emergency Medicine

## 2022-06-14 LAB — URINE CULTURE

## 2022-06-14 LAB — CULTURE, GROUP A STREP (THRC)

## 2022-06-14 NOTE — Telephone Encounter (Signed)
Pt inquired about strep culture and urine culture results. Pt aware strep culture was negative. Pt inquired about urine culture results that was done at PCP office and pt aware can not give out test results that were performed at another practice/provider. Pt verbalized understanding and reported would follow up with pcp office.

## 2022-06-15 ENCOUNTER — Other Ambulatory Visit: Payer: Self-pay

## 2022-06-15 ENCOUNTER — Ambulatory Visit
Admission: RE | Admit: 2022-06-15 | Discharge: 2022-06-15 | Disposition: A | Discharge status: Home / Self Care | Payer: Medicaid Other | Source: Ambulatory Visit | Attending: Student | Admitting: Student

## 2022-06-15 VITALS — BP 153/97 | HR 98 | Temp 98.4°F | Resp 18

## 2022-06-15 DIAGNOSIS — W57XXXA Bitten or stung by nonvenomous insect and other nonvenomous arthropods, initial encounter: Secondary | ICD-10-CM | POA: Diagnosis not present

## 2022-06-15 DIAGNOSIS — S30860A Insect bite (nonvenomous) of lower back and pelvis, initial encounter: Secondary | ICD-10-CM | POA: Diagnosis not present

## 2022-06-15 DIAGNOSIS — R238 Other skin changes: Secondary | ICD-10-CM

## 2022-06-15 MED ORDER — DOXYCYCLINE HYCLATE 100 MG PO CAPS: 100.0000 mg | ORAL_CAPSULE | Freq: Every day | ORAL | 0 refills | Status: AC

## 2022-06-15 NOTE — ED Provider Notes (Signed)
[facility]    CSN: [id number] Arrival date & time: 06/15/22  1443      History   Chief Complaint Chief Complaint  Patient presents with   Tick Removal    I found a tick on my back this past Monday and I haven't been feeling great so I wanted to be checked out to make sure everything is okay. - Entered by patient    HPI Jacqueline [name] is a 46 y.o. female presenting with constellation of symptoms.  History dysuria, polycythemia, hyperlipidemia.  We last evaluated her on 06/11/2022, at that time she was strep negative and strep culture was also negative.  She then saw her primary care provider on 06/12/2022, she was experiencing some dysuria; UA showed blood but this was attributed to her menstrual period, and the culture showed no growth.  She was not treated for strep or UTI, and the symptoms seem to have resolved at the time of this visit.  As these issues have already been evaluated, she is amenable to focusing on the tick bite today.  She states she pulled an engorged tick off of her left flank on 06/09/2022, she is uncertain how long the tick was attached.  She did pull off the entire tick.  She states the area was quite uncomfortable after pulling the tick off, and she was concerned for lymph node swelling.  She has not noted any rashes, bull's-eye rash, myalgias, fever/chills, flulike illness.  She incidentally notes left lumbar strain, which is also very concerning to her; she has a long history L sciatica and states these symptoms are consistent with typical sciatica.  She verbalizes that she is not having any urinary or vaginal symptoms today, and is still menstruating.  She also verbalizes that she is not having any sore throat, cough, congestion, fevers.  HPI  Past Medical History:  Diagnosis Date   Annual visit for general adult medical examination with abnormal findings 02/22/2020   Encounter for TB tine test 02/22/2020   GERD (gastroesophageal reflux disease)    Kidney  stones    hx UTI   Need for immunization against influenza 12/29/2019   Sinusitis    URI (upper respiratory infection)    UTI (lower urinary tract infection)     Patient Active Problem List   Diagnosis Date Noted   Dysuria 06/12/2022   Acute rhinosinusitis 03/14/2022   Mixed hyperlipidemia 02/11/2022   Polycythemia 02/11/2022   Screening mammogram for breast cancer 02/11/2022   Annual physical exam 11/18/2021   Rash and nonspecific skin eruption 11/18/2021   URI (upper respiratory infection) 05/28/2021   Sexuality issues 05/09/2021   Sciatica of right side 08/08/2020   Vitamin D deficiency 03/20/2020   Fatigue 02/22/2020   Adjustment insomnia 01/25/2020   Depression, major, single episode, moderate ([facility]) 12/29/2019   Anxiety 12/29/2019   Left breast mass 12/29/2019   Obesity (BMI 30-39.9) 12/29/2019   Nicotine abuse 12/29/2019   Prehypertension 12/29/2019    Past Surgical History:  Procedure Laterality Date   DILATION AND CURETTAGE OF UTERUS     TUBAL LIGATION      OB History     Gravida  3   Para  3   Term  3   Preterm      AB      Living  3      SAB      IAB      Ectopic      Multiple  Live Births               Home Medications    Prior to Admission medications   Medication Sig Start Date End Date Taking? Authorizing Provider  doxycycline (VIBRAMYCIN) 100 MG capsule Take 1 capsule (100 mg total) by mouth daily for 2 doses. Take two pills today. One with lunch and one with dinner. 06/15/22 06/17/22 Yes Hazel Sams, PA-C  busPIRone (BUSPAR) 15 MG tablet Take 15 mg by mouth 2 (two) times daily. 06/05/22   [provider]  fluticasone (FLONASE) 50 MCG/ACT nasal spray Place 2 sprays into both nostrils daily. 03/14/22   Paseda, Dewaine Conger, FNP  gabapentin (NEURONTIN) 300 MG capsule Take 300 mg by mouth 3 (three) times daily as needed. 06/04/22   [provider]  lamoTRIgine (LAMICTAL) 25 MG tablet Take 50 mg by mouth daily.  11/15/21   [provider]  sodium chloride (OCEAN) 0.65 % SOLN nasal spray Place 1 spray into both nostrils as needed for congestion. 03/14/22   Renee Rival, FNP  traZODone (DESYREL) 50 MG tablet Take 50 mg by mouth at bedtime. 03/13/22   [provider]    Family History Family History  Problem Relation Age of Onset   Diabetes Other    Heart disease Mother    Diabetes Maternal Aunt    Cancer Paternal Aunt        lymph node, breast, and ovarian   Breast cancer Paternal Aunt    Diabetes Maternal Grandmother    Heart disease Maternal Grandmother    Heart failure Maternal Grandmother    Heart attack Maternal Grandfather    Cancer Paternal Grandmother        breast cancer   Breast cancer Paternal Grandmother    Cancer Paternal Grandfather        lung cancer    Social History Social History   Tobacco Use   Smoking status: Every Day    Packs/day: 0.50    Years: 5.00    Total pack years: 2.50    Types: Cigarettes    Last attempt to quit: 05/08/2010    Years since quitting: 12.1   Smokeless tobacco: Never  Vaping Use   Vaping Use: Never used  Substance Use Topics   Alcohol use: No   Drug use: No     Allergies   Penicillins   Review of Systems Review of Systems  All other systems reviewed and are negative.    Physical Exam Triage Vital Signs ED Triage Vitals [06/15/22 1028]  Enc Vitals Group     BP (!) 153/97     Pulse Rate 98     Resp 18     Temp 98.4 F (36.9 C)     Temp Source Oral     SpO2 97 %     Weight      Height      Head Circumference      Peak Flow      Pain Score 0     Pain Loc      Pain Edu?      Excl. in [place]?    No data found.  Updated Vital Signs BP (!) 153/97 (BP Location: Right Arm)   Pulse 98   Temp 98.4 F (36.9 C) (Oral)   Resp 18   SpO2 97%   Visual Acuity Right Eye Distance:   Left Eye Distance:   Bilateral Distance:    Right Eye Near:   Left Eye  Near:    Bilateral Near:     Physical  Exam Vitals reviewed.  Constitutional:      General: She is not in acute distress.    Appearance: Normal appearance. She is not ill-appearing.  HENT:     Head: Normocephalic and atraumatic.  Pulmonary:     Effort: Pulmonary effort is normal.  Skin:    Comments: L flank - tiny 62mm erythematous lesion at the site of tick bite. Tick is not attached. There is no rash or erythema migrans.   Neurological:     General: No focal deficit present.     Mental Status: She is alert and oriented to person, place, and time.  Psychiatric:        Mood and Affect: Mood normal.        Behavior: Behavior normal.        Thought Content: Thought content normal.        Judgment: Judgment normal.      UC Treatments / Results  Labs (all labs ordered are listed, but only abnormal results are displayed) Labs Reviewed - No data to display  EKG   Radiology No results found.  Procedures Procedures (including critical care time)  Medications Ordered in UC Medications - No data to display  Initial Impression / Assessment and Plan / UC Course  I have reviewed the triage vital signs and the nursing notes.  Pertinent labs & imaging results that were available during my care of the patient were reviewed by me and considered in my medical decision making (see chart for details).     This patient is a very pleasant 46 y.o. year old female presenting for evaluation following tick bite. Afebrile, nontachy.  Currently menstruating, states she cannot be pregnant or breast-feeding.  We last evaluated her on 06/11/2022, at that time she was strep negative and strep culture was also negative.  She then saw her primary care provider on 06/12/2022, she was experiencing some dysuria; UA showed blood but this was attributed to her menstrual period, and the culture showed no growth.  She was not treated for strep or UTI, and the symptoms seem to have resolved at the time of this visit.  As these issues have already  been evaluated and subsequently resolved, she is amenable to focusing on the tick bite today.  She does have a tiny lesion at the site of tick bite, the tick is not attached.  There are no rashes or erythema migrans rash, and she does not have any flulike syndromes, myalgias, arthralgias.  We will proceed with doxycycline 200 mg for lyme prophylaxis, and she can follow-up in 1 more week for Lyme serology testing if desired.  ED return precautions discussed. Patient verbalizes understanding and agreement.    Final Clinical Impressions(s) / UC Diagnoses   Final diagnoses:  Change of skin color  Tick bite of lower back, initial encounter     Discharge Instructions      -Doxycycline two pills today -Follow-up in 1 week for blood work if new symptoms - bodyaches, bullseye rash, fevers, etc. Follow up sooner if symptoms are severe.    ED Prescriptions     Medication Sig Dispense Auth. Provider   doxycycline (VIBRAMYCIN) 100 MG capsule Take 1 capsule (100 mg total) by mouth daily for 2 doses. Take two pills today. One with lunch and one with dinner. 2 capsule Rhys Martini, PA-C      PDMP not reviewed this encounter.   Ignacia Bayley  E, PA-C 06/15/22 1137

## 2022-06-15 NOTE — Progress Notes (Signed)
Urine test negative for UTI

## 2022-06-15 NOTE — ED Triage Notes (Signed)
Pt reports removed a tick on Monday and reports removed tick from back. Pt reports site is improving but reports was painful when removed and reports lymph node swelling and back pain for last several days.   Pt has been seen for possible strep and uti and reports strep was negative and urine was sent for culture and reports "forgot to tell them about tick so now I'm just worried about if it is all related"

## 2022-06-15 NOTE — Discharge Instructions (Addendum)
-  Doxycycline two pills today -Follow-up in 1 week for blood work if new symptoms - bodyaches, bullseye rash, fevers, etc. Follow up sooner if symptoms are severe.

## 2022-06-16 ENCOUNTER — Encounter: Payer: Self-pay | Admitting: Nurse Practitioner

## 2022-06-19 ENCOUNTER — Ambulatory Visit (INDEPENDENT_AMBULATORY_CARE_PROVIDER_SITE_OTHER): Payer: Medicaid Other | Admitting: Nurse Practitioner

## 2022-06-19 ENCOUNTER — Encounter: Payer: Self-pay | Admitting: Nurse Practitioner

## 2022-06-19 VITALS — BP 117/81 | HR 89 | Ht 60.0 in | Wt 180.0 lb

## 2022-06-19 DIAGNOSIS — Z720 Tobacco use: Secondary | ICD-10-CM

## 2022-06-19 DIAGNOSIS — M5440 Lumbago with sciatica, unspecified side: Secondary | ICD-10-CM | POA: Diagnosis not present

## 2022-06-19 DIAGNOSIS — W57XXXA Bitten or stung by nonvenomous insect and other nonvenomous arthropods, initial encounter: Secondary | ICD-10-CM | POA: Insufficient documentation

## 2022-06-19 DIAGNOSIS — W57XXXD Bitten or stung by nonvenomous insect and other nonvenomous arthropods, subsequent encounter: Secondary | ICD-10-CM | POA: Diagnosis not present

## 2022-06-19 DIAGNOSIS — M545 Low back pain, unspecified: Secondary | ICD-10-CM | POA: Insufficient documentation

## 2022-06-19 DIAGNOSIS — S0006XD Insect bite (nonvenomous) of scalp, subsequent encounter: Secondary | ICD-10-CM

## 2022-06-19 NOTE — Patient Instructions (Addendum)

## 2022-06-19 NOTE — Progress Notes (Signed)
   Jacqueline [name]     MRN: [id number]      DOB: [birth year]   HPI Jacqueline [name] with past medical history of mixed hyperlipidemia, nicotine abuse, obesity, sciatica of right side is here for complaints of pain on left low back and thigh which she has had since She found a tick on her left flank on  6/12. Pt stated that  she pulled  off the tick and went to the urgent she did not know how long the tick was on her , she did had  a rash before she went to the urgent care where  she was prescribed doxycuycline. Denies fever , chills, rashes .  Patient states that that her low back pain, left thigh pain is much better she described her pain as a dull pain.  She stated that she sometimes has numbness and tingling sensation running down her left leg     ROS Denies recent fever or chills. Denies sinus pressure, nasal congestion, ear pain or sore throat. Denies chest congestion, productive cough or wheezing. Denies chest pains, palpitations and leg swelling Denies abdominal pain, nausea, vomiting,diarrhea or constipation.   Denies dysuria, frequency, hesitancy or incontinence. Denies headaches, seizures, numbness, or tingling. Denies depression, anxiety or insomnia.    PE  BP 117/81 (BP Location: Left Arm, Patient Position: Sitting, Cuff Size: Normal)   Pulse 89   Ht 5' (1.524 m)   Wt 180 lb (81.6 kg)   SpO2 94%   BMI 35.15 kg/m   Patient alert and oriented and in no cardiopulmonary distress.  Chest: Clear to auscultation bilaterally.  CVS: S1, S2 no murmurs, no S3.Regular rate.  ABD: Soft non tender.   Ext: No edema  MS: Adequate ROM spine, shoulders, hips and knees.  Skin: Intact, no ulcerations or rash noted.  Psych: Good eye contact, normal affect. Memory intact not anxious or depressed appearing.  CNS: CN 2-12 intact, power,  normal throughout.no focal deficits noted.   Assessment & Plan  Tick bite Had tick bite on her left flank on 6/12 She has completed full course of  doxycycline ordered.  No bull eye rash noted on examination noted, neuro intact  Patient encouraged to continue to monitor sites and reports if she develops bull eye  rash  Low back pain Stated that her back pain is much better Patient told to take OTC ibuprofen as needed Use of heat encouraged Stretching exercises encouraged   Nicotine abuse Smokes about half  pack/day  Asked about quitting: confirms that he/she currently smokes cigarettes Advise to quit smoking: Educated about QUITTING to reduce the risk of cancer, cardio and cerebrovascular disease. Assess willingness: Unwilling to quit at this time, but is working on cutting back. Assist with counseling and pharmacotherapy: Counseled for 5 minutes and literature provided. Arrange for follow up: follow up in 3 months and continue to offer help.

## 2022-06-19 NOTE — Assessment & Plan Note (Signed)
Stated that her back pain is much better Patient told to take OTC ibuprofen as needed Use of heat encouraged Stretching exercises encouraged

## 2022-06-19 NOTE — Assessment & Plan Note (Signed)
Smokes about half  pack/day  Asked about quitting: confirms that he/she currently smokes cigarettes Advise to quit smoking: Educated about QUITTING to reduce the risk of cancer, cardio and cerebrovascular disease. Assess willingness: Unwilling to quit at this time, but is working on cutting back. Assist with counseling and pharmacotherapy: Counseled for 5 minutes and literature provided. Arrange for follow up: follow up in 3 months and continue to offer help.

## 2022-06-19 NOTE — Assessment & Plan Note (Addendum)
Had tick bite on her left flank on 6/12 She has completed full course of doxycycline ordered.  No bull eye rash noted on examination noted, neuro intact  Patient encouraged to continue to monitor sites and reports if she develops bull eye  rash

## 2022-06-20 ENCOUNTER — Telehealth: Payer: Self-pay | Admitting: Nurse Practitioner

## 2022-06-20 ENCOUNTER — Other Ambulatory Visit: Payer: Self-pay | Admitting: Nurse Practitioner

## 2022-06-20 DIAGNOSIS — M5440 Lumbago with sciatica, unspecified side: Secondary | ICD-10-CM

## 2022-06-20 MED ORDER — IBUPROFEN 600 MG PO TABS: 600.0000 mg | ORAL_TABLET | Freq: Three times a day (TID) | ORAL | 0 refills | Status: DC | PRN

## 2022-06-20 NOTE — Telephone Encounter (Signed)
Pt called stating the sciatica is worse today. Wants to know what can be done?

## 2022-06-20 NOTE — Telephone Encounter (Signed)
Please advise 

## 2022-06-20 NOTE — Telephone Encounter (Signed)
Spoke with pt advised of Fola's message pt verbalized understanding °

## 2022-06-23 DIAGNOSIS — F4310 Post-traumatic stress disorder, unspecified: Secondary | ICD-10-CM | POA: Diagnosis not present

## 2022-06-23 DIAGNOSIS — F4381 Prolonged grief disorder: Secondary | ICD-10-CM | POA: Diagnosis not present

## 2022-06-23 DIAGNOSIS — F341 Dysthymic disorder: Secondary | ICD-10-CM | POA: Diagnosis not present

## 2022-06-24 ENCOUNTER — Encounter: Payer: Self-pay | Admitting: Family Medicine

## 2022-06-24 ENCOUNTER — Ambulatory Visit: Payer: Medicaid Other | Admitting: Family Medicine

## 2022-06-24 VITALS — BP 148/100 | HR 107 | Ht 63.0 in | Wt 177.4 lb

## 2022-06-24 DIAGNOSIS — S20462S Insect bite (nonvenomous) of left back wall of thorax, sequela: Secondary | ICD-10-CM

## 2022-06-24 DIAGNOSIS — S30860S Insect bite (nonvenomous) of lower back and pelvis, sequela: Secondary | ICD-10-CM | POA: Diagnosis not present

## 2022-06-24 DIAGNOSIS — S30860D Insect bite (nonvenomous) of lower back and pelvis, subsequent encounter: Secondary | ICD-10-CM

## 2022-06-24 DIAGNOSIS — W57XXXD Bitten or stung by nonvenomous insect and other nonvenomous arthropods, subsequent encounter: Secondary | ICD-10-CM | POA: Diagnosis not present

## 2022-06-24 DIAGNOSIS — R102 Pelvic and perineal pain: Secondary | ICD-10-CM

## 2022-06-24 DIAGNOSIS — W57XXXS Bitten or stung by nonvenomous insect and other nonvenomous arthropods, sequela: Secondary | ICD-10-CM

## 2022-06-24 LAB — POCT URINALYSIS DIP (CLINITEK)
Bilirubin, UA: NEGATIVE
Blood, UA: NEGATIVE
Glucose, UA: NEGATIVE mg/dL
Ketones, POC UA: NEGATIVE mg/dL
Leukocytes, UA: NEGATIVE
Nitrite, UA: NEGATIVE
POC PROTEIN,UA: NEGATIVE
Spec Grav, UA: 1.025 (ref 1.010–1.025)
Urobilinogen, UA: 0.2 E.U./dL
pH, UA: 6 (ref 5.0–8.0)

## 2022-06-24 MED ORDER — DOXYCYCLINE HYCLATE 100 MG PO TABS: 100.0000 mg | ORAL_TABLET | Freq: Two times a day (BID) | ORAL | 0 refills | Status: AC

## 2022-06-24 NOTE — Progress Notes (Signed)
Established Patient Office Visit  Subjective:  Patient ID: Jacqueline [name], female    DOB: 01/08/1976  Age: 46 y.o. MRN: [id number]  CC:  Chief Complaint  Patient presents with   Pelvic Pain    Pt c/o pelvic pain and low back pain, has been going to the restroom frequently.    Tick Removal    Onset of tick bite 2 weeks ago, 06/09/2022. C/o itching and has had a rash come up since the tick bite on her body, pt is really anxious about all of her sx.     HPI Jacqueline [name] is a 46 y.o. female with past medical history of tick bite and sciatica of the right side presents with c/o of back pain, pelvic pressure, urgency, and frequency with urination.   Tick bite: she pulled an engorged tick off her left flank on 06/09/2022; she is uncertain how long the tick was attached.  She did pull off the entire tick. She states the area was uncomfortable after pulling the tick off and had lymph node swelling. She was treated in the ED on 06/15/22 with two doses of doxycycline for lyme prophylaxis.    Today, the patient is very anxious and worries that she will die from the tick bite. She states that she has been having back pain and urinary symptoms. She reports hx of kidney stones.    Past Medical History:  Diagnosis Date   Annual visit for general adult medical examination with abnormal findings 02/22/2020   Encounter for TB tine test 02/22/2020   GERD (gastroesophageal reflux disease)    Kidney stones    hx UTI   Need for immunization against influenza 12/29/2019   Sinusitis    URI (upper respiratory infection)    UTI (lower urinary tract infection)     Past Surgical History:  Procedure Laterality Date   DILATION AND CURETTAGE OF UTERUS     TUBAL LIGATION      Family History  Problem Relation Age of Onset   Diabetes Other    Heart disease Mother    Diabetes Maternal Aunt    Cancer Paternal Aunt        lymph node, breast, and ovarian   Breast cancer Paternal Aunt    Diabetes Maternal  Grandmother    Heart disease Maternal Grandmother    Heart failure Maternal Grandmother    Heart attack Maternal Grandfather    Cancer Paternal Grandmother        breast cancer   Breast cancer Paternal Grandmother    Cancer Paternal Grandfather        lung cancer    Social History   Socioeconomic History   Marital status: Single    Spouse name: Not on file   Number of children: 1   Years of education: Not on file   Highest education level: GED or equivalent  Occupational History   Not on file  Tobacco Use   Smoking status: Every Day    Packs/day: 0.50    Years: 5.00    Total pack years: 2.50    Types: Cigarettes    Last attempt to quit: 05/08/2010    Years since quitting: 12.1   Smokeless tobacco: Never  Vaping Use   Vaping Use: Never used  Substance and Sexual Activity   Alcohol use: No   Drug use: No   Sexual activity: Not Currently    Birth control/protection: Surgical  Other Topics Concern   Not on file  Social  History Narrative   Lives with son Marlene Bast is 104 years old.   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     fluticasone (FLONASE) 50 MCG/ACT nasal spray Place 2 sprays into both nostrils daily. 16 g 2   gabapentin (NEURONTIN) 300 MG capsule Take 300 mg by mouth 3 (three) times  daily as needed.     ibuprofen (ADVIL) 600 MG tablet Take 1 tablet (600 mg total) by mouth every 8 (eight) hours as needed. 30 tablet 0   lamoTRIgine (LAMICTAL) 25 MG tablet Take 50 mg by mouth daily.     sodium chloride (OCEAN) 0.65 % SOLN nasal spray Place 1 spray into both nostrils as needed for congestion. 15 mL 0   traZODone (DESYREL) 50 MG tablet Take 50 mg by mouth at bedtime.     No facility-administered medications prior to visit.    Allergies  Allergen Reactions   Penicillins Nausea And Vomiting and Rash    ROS Review of Systems  Constitutional:  Negative for chills, fatigue and fever.  Eyes:  Negative for photophobia and visual disturbance.  Genitourinary:  Positive for frequency and urgency.  Skin:  Negative for rash and wound.  Neurological:  Negative for tremors, weakness and headaches.      Objective:    Physical Exam Cardiovascular:     Rate and Rhythm: Normal rate and regular rhythm.     Pulses: Normal pulses.     Heart sounds: Normal heart sounds.  Pulmonary:     Effort: Pulmonary effort is normal.     Breath sounds: Normal breath sounds.  Abdominal:     Tenderness: There is no abdominal tenderness. There is no right CVA tenderness or left CVA tenderness.  Musculoskeletal:     Cervical back: No rigidity.  Skin:    Findings: No rash (L flank - tiny 2mm erythematous lesion at the site of tick bite. Tick is not attached. There is no rash or erythema migrans.).  Neurological:     Mental Status: She is alert.     BP (!) 148/100   Pulse (!) 107   Ht 5\' 3"  (1.6 m)   Wt 177 lb 6.4 oz (80.5 kg)   SpO2 97%   BMI 31.42 kg/m  Wt Readings from Last 3 Encounters:  06/24/22 177 lb 6.4 oz (80.5 kg)  06/19/22 180 lb (81.6 kg)  02/11/22 179 lb 1.9 oz (81.2 kg)    Lab Results  Component Value Date   TSH 1.480 11/18/2021   Lab Results  Component Value Date   WBC 13.1 (H) 06/12/2022   HGB 14.9 06/12/2022   HCT 44.1 06/12/2022  MCV 85 06/12/2022   PLT  365 06/12/2022   Lab Results  Component Value Date   NA 139 11/18/2021   K 4.6 11/18/2021   CO2 23 11/18/2021   GLUCOSE 93 11/18/2021   BUN 8 11/18/2021   CREATININE 0.70 11/18/2021   BILITOT 0.6 11/18/2021   ALKPHOS 96 11/18/2021   AST 18 11/18/2021   ALT 15 11/18/2021   PROT 7.7 11/18/2021   ALBUMIN 4.9 (H) 11/18/2021   CALCIUM 9.7 11/18/2021   ANIONGAP 6 09/19/2019   EGFR 110 11/18/2021   Lab Results  Component Value Date   CHOL 213 (H) 06/12/2022   Lab Results  Component Value Date   HDL 41 06/12/2022   Lab Results  Component Value Date   LDLCALC 151 (H) 06/12/2022   Lab Results  Component Value Date   TRIG 117 06/12/2022   Lab Results  Component Value Date   CHOLHDL 5.2 (H) 06/12/2022   Lab Results  Component Value Date   HGBA1C 5.8 (H) 11/18/2021      Assessment & Plan:   Problem List Items Addressed This Visit       Musculoskeletal and Integument   Tick bite    -Will treat with doxycycline for lyme prophylaxis -Encouraged increasing fluid intake -Low suspicion for UTI as urine dipstick was negative for leuk and nitrates      Other Visit Diagnoses     Tick bite of back, sequela    -  Primary   Relevant Medications   doxycycline (VIBRA-TABS) 100 MG tablet   Pelvic pain       Relevant Orders   POCT URINALYSIS DIP (CLINITEK) (Completed)       Meds ordered this encounter  Medications   doxycycline (VIBRA-TABS) 100 MG tablet    Sig: Take 1 tablet (100 mg total) by mouth 2 (two) times daily for 7 days.    Dispense:  14 tablet    Refill:  0    Follow-up: Return if symptoms worsen or fail to improve.    Gilmore Laroche, FNP

## 2022-06-25 ENCOUNTER — Telehealth: Payer: Self-pay | Admitting: Nurse Practitioner

## 2022-06-25 ENCOUNTER — Other Ambulatory Visit: Payer: Self-pay | Admitting: Family Medicine

## 2022-06-25 DIAGNOSIS — F4381 Prolonged grief disorder: Secondary | ICD-10-CM | POA: Diagnosis not present

## 2022-06-25 DIAGNOSIS — F341 Dysthymic disorder: Secondary | ICD-10-CM | POA: Diagnosis not present

## 2022-06-25 DIAGNOSIS — F4312 Post-traumatic stress disorder, chronic: Secondary | ICD-10-CM | POA: Diagnosis not present

## 2022-06-25 DIAGNOSIS — F411 Generalized anxiety disorder: Secondary | ICD-10-CM | POA: Diagnosis not present

## 2022-06-25 NOTE — Telephone Encounter (Signed)
Returned call to pt, states she is very anxious and having panic attacks since the tick bite, she has been taking her gabapentin in place of anxiety medication, but this has not been working to keep her calm, please advice on what she can take or if something can be prescribed to help with the panic attack.

## 2022-06-25 NOTE — Telephone Encounter (Signed)
Patient returning call asked if nurse return her call concern on tick bite appointment from yesterday call back # [phone]

## 2022-06-25 NOTE — Telephone Encounter (Signed)
Pt called wanting a nurse to call her she has some questions in regards to the tick bite & Sx

## 2022-06-26 ENCOUNTER — Encounter (HOSPITAL_COMMUNITY): Payer: Self-pay | Admitting: *Deleted

## 2022-06-26 ENCOUNTER — Other Ambulatory Visit: Payer: Self-pay | Admitting: Nurse Practitioner

## 2022-06-26 ENCOUNTER — Other Ambulatory Visit: Payer: Self-pay

## 2022-06-26 ENCOUNTER — Other Ambulatory Visit: Payer: Self-pay | Admitting: Family Medicine

## 2022-06-26 ENCOUNTER — Emergency Department (HOSPITAL_COMMUNITY)
Admission: EM | Admit: 2022-06-26 | Discharge: 2022-06-26 | Disposition: A | Discharge status: Home / Self Care | Payer: Medicaid Other | Attending: Student | Admitting: Student

## 2022-06-26 ENCOUNTER — Telehealth: Payer: Self-pay

## 2022-06-26 DIAGNOSIS — F419 Anxiety disorder, unspecified: Secondary | ICD-10-CM

## 2022-06-26 MED ORDER — HYDROXYZINE PAMOATE 25 MG PO CAPS: 25.0000 mg | ORAL_CAPSULE | Freq: Three times a day (TID) | ORAL | 1 refills | Status: DC | PRN

## 2022-06-26 MED ORDER — LORAZEPAM 2 MG/ML IJ SOLN
1.0000 mg | Freq: Once | INTRAMUSCULAR | Status: AC
  Administered 2022-06-26: 1 mg via INTRAMUSCULAR
  Filled 2022-06-26: qty 1

## 2022-06-26 MED ORDER — FLUOXETINE HCL 10 MG PO TABS: 10.0000 mg | ORAL_TABLET | Freq: Every day | ORAL | 3 refills | Status: DC

## 2022-06-26 NOTE — Telephone Encounter (Signed)
She would like to try another medication rather than the buspirone says she's been off of it for a few months now and doesn't want to take it because it was not working for her, she's okay trying another SSRI or SNRI, please advice?

## 2022-06-26 NOTE — Telephone Encounter (Signed)
Pt has been informed, states understanding.

## 2022-06-26 NOTE — Telephone Encounter (Signed)
Prozac has been ordered and sent to her pharmacy

## 2022-06-26 NOTE — Telephone Encounter (Signed)
Patient returning lab result call 

## 2022-06-26 NOTE — Telephone Encounter (Signed)
Spoke with pt advised to her that prior authorization has been placed but we need to wait on response as Malachi Bonds sent prescription in today. Pt stated can she be prescribed something to work now she feels like she is going to have a heart attack advised to pt if she feels that way then she needs to go to the emergency room, pt states that she has her 44 year old son with her and said she's good and hung up the phone. Please advise and also see previous encounters in chart

## 2022-06-26 NOTE — Discharge Instructions (Signed)
Please pick up your prescriptions tomorrow.  Please take them like we discussed.  I would like for you to follow-up with your primary care doctor in 1 week to see how the medications are working.  Please return to the emergency room for any worsening symptoms you might have.

## 2022-06-26 NOTE — Telephone Encounter (Signed)
Left vm for pt to return call  

## 2022-06-26 NOTE — Telephone Encounter (Signed)
Please ask the patient if she's taking her Buspar ordered, which can help manage her symptoms. The medications I prescribe for panic attacks are SSRIs and SNRIs.  I recommend diaphragmatic breathing, meditation, Music therapy, visual imagery, and aromatherapy may also help reduce feelings of anxiety.

## 2022-06-26 NOTE — ED Triage Notes (Signed)
Pt with anxiety x 2 weeks.  Pt is on medication for anxiety.  Pt tearful in triage room. Denies any pain currently.

## 2022-06-26 NOTE — Telephone Encounter (Signed)
Patient returning call just want to make sure the prior authorization will be sent in does not want to pay the $ 45.00 / please call patient back.

## 2022-06-26 NOTE — Telephone Encounter (Signed)
Spoke with pt she states that she can not wait 2-4 weeks wanting to know what she can do in the mean time wanting to know if there is anything that she can have that will help her right now states that she is unable to work or keep her son right now due to scaring him with the attacks. She would like to try one of the medications ie prozac or zoloft however is concerned for right now. Please advise

## 2022-06-26 NOTE — Telephone Encounter (Signed)
Patient called said pharmacy will be faxing prior authorization on this medicine FLUoxetine (PROZAC) 10 MG tablet Insurance will not cover.

## 2022-06-26 NOTE — ED Provider Notes (Signed)
[facility] EMERGENCY DEPARTMENT Provider Note   CSN: [id number] Arrival date & time: 06/26/22  1739     History Chief Complaint  Patient presents with   Anxiety    Jacqueline [name] is a 46 y.o. female patient with history of anxiety who presents to the emergency department today with a 2-week history of intermittent palpitations, tingliness to the bilateral hands and feet, sense of impending doom.  Patient states she has been seen evaluated urgent care several times and her primary care doctor who wrote her prescription for Prozac and hydroxyzine which she has yet to pick up.  She denies any chest pain.  Patient also states that she is scared that she is sick because she was bit by a tick 1 week ago.  She denies any fever, chills, rash from that.  She is currently on doxycycline.   Anxiety       Home Medications Prior to Admission medications   Medication Sig Start Date End Date Taking? Authorizing Provider  doxycycline (VIBRA-TABS) 100 MG tablet Take 1 tablet (100 mg total) by mouth 2 (two) times daily for 7 days. 06/24/22 07/01/22 Yes Gilmore Laroche, FNP  fluticasone (FLONASE) 50 MCG/ACT nasal spray Place 2 sprays into both nostrils daily. 03/14/22  Yes Paseda, Baird Kay, FNP  gabapentin (NEURONTIN) 300 MG capsule Take 300 mg by mouth 3 (three) times daily as needed. 06/04/22  Yes [provider]  ibuprofen (ADVIL) 600 MG tablet Take 1 tablet (600 mg total) by mouth every 8 (eight) hours as needed. 06/20/22  Yes Paseda, Baird Kay, FNP  lamoTRIgine (LAMICTAL) 150 MG tablet Take 150 mg by mouth daily. 06/26/22  Yes [provider]  sodium chloride (OCEAN) 0.65 % SOLN nasal spray Place 1 spray into both nostrils as needed for congestion. 03/14/22  Yes Paseda, Baird Kay, FNP  FLUoxetine (PROZAC) 10 MG tablet Take 1 tablet (10 mg total) by mouth daily. 06/26/22   Gilmore Laroche, FNP  hydrOXYzine (VISTARIL) 25 MG capsule Take 1 capsule (25 mg total) by mouth every 8 (eight)  hours as needed. 06/26/22   Donell Beers, FNP      Allergies    Penicillins    Review of Systems   Review of Systems  All other systems reviewed and are negative.   Physical Exam Updated Vital Signs BP (!) 110/99   Pulse 84   Temp 98.1 F (36.7 C) (Oral)   Resp 15   SpO2 99%  Physical Exam Vitals and nursing note reviewed.  Constitutional:      General: She is not in acute distress.    Appearance: Normal appearance.  HENT:     Head: Normocephalic and atraumatic.  Eyes:     General:        Right eye: No discharge.        Left eye: No discharge.  Cardiovascular:     Comments: Regular rate and rhythm.  S1/S2 are distinct without any evidence of murmur, rubs, or gallops.  Radial pulses are 2+ bilaterally.  Dorsalis pedis pulses are 2+ bilaterally.  No evidence of pedal edema. Pulmonary:     Comments: Clear to auscultation bilaterally.  Normal effort.  No respiratory distress.  No evidence of wheezes, rales, or rhonchi heard throughout. Abdominal:     General: Abdomen is flat. Bowel sounds are normal. There is no distension.     Tenderness: There is no abdominal tenderness. There is no guarding or rebound.  Musculoskeletal:  General: Normal range of motion.     Cervical back: Neck supple.  Skin:    General: Skin is warm and dry.     Findings: No rash.  Neurological:     General: No focal deficit present.     Mental Status: She is alert.  Psychiatric:        Mood and Affect: Mood is anxious.        Behavior: Behavior normal.     ED Results / Procedures / Treatments   Labs (all labs ordered are listed, but only abnormal results are displayed) Labs Reviewed - No data to display  EKG None  Radiology No results found.  Procedures Procedures    Medications Ordered in ED Medications  LORazepam (ATIVAN) injection 1 mg (1 mg Intramuscular Given 06/26/22 2151)    ED Course/ Medical Decision Making/ A&P Clinical Course as of 06/26/22 2250  Thu Jun 26, 2022  2247 On reevaluation, patient states she is feeling much better after 1 mg of Xanax.  I instructed the patient to pick up her Prozac and hydroxyzine prescriptions at the pharmacy tomorrow.  Patient amendable to this plan.  All questions or concerns addressed.  She is safe for discharge. [CF]    Clinical Course User Index [CF] Teressa Lower, PA-C                           Medical Decision Making Jacqueline [name] is a 46 y.o. female patient who presents to the emergency department with anxiety symptoms.  I suspect this is likely secondary to a panic attack.  We will plan to give her some Ativan and reassess.  I doubt ACS at this time.   Risk Prescription drug management. Parenteral controlled substances. Risk Details: On reevaluation, patient states she is feeling much better.  Her heart rate has improved in addition to her respiration rate.  She states that she is less jittery and overall less anxious.  I do feel this is likely related to her underlying anxiety.  I instructed for her to pick up her Prozac and hydroxyzine at the pharmacy tomorrow morning and begin taking those.  Patient amenable this plan.  She is safe for discharge at this time.   Final Clinical Impression(s) / ED Diagnoses Final diagnoses:  Anxiety    Rx / DC Orders ED Discharge Orders     None         Jolyn Lent 06/26/22 2250    Glendora Score, MD 06/27/22 1331

## 2022-06-26 NOTE — Telephone Encounter (Signed)
Spoke with pt

## 2022-06-26 NOTE — Telephone Encounter (Signed)
Spoke with pt advised of Fola's message pt verbalized understanding °

## 2022-07-01 DIAGNOSIS — F4381 Prolonged grief disorder: Secondary | ICD-10-CM | POA: Diagnosis not present

## 2022-07-01 DIAGNOSIS — F4312 Post-traumatic stress disorder, chronic: Secondary | ICD-10-CM | POA: Diagnosis not present

## 2022-07-01 DIAGNOSIS — F341 Dysthymic disorder: Secondary | ICD-10-CM | POA: Diagnosis not present

## 2022-07-01 DIAGNOSIS — F411 Generalized anxiety disorder: Secondary | ICD-10-CM | POA: Diagnosis not present

## 2022-07-02 DIAGNOSIS — F411 Generalized anxiety disorder: Secondary | ICD-10-CM | POA: Diagnosis not present

## 2022-07-02 DIAGNOSIS — F331 Major depressive disorder, recurrent, moderate: Secondary | ICD-10-CM | POA: Diagnosis not present

## 2022-07-02 DIAGNOSIS — F410 Panic disorder [episodic paroxysmal anxiety] without agoraphobia: Secondary | ICD-10-CM | POA: Diagnosis not present

## 2022-07-07 ENCOUNTER — Ambulatory Visit (INDEPENDENT_AMBULATORY_CARE_PROVIDER_SITE_OTHER): Payer: Medicaid Other | Admitting: Internal Medicine

## 2022-07-07 ENCOUNTER — Encounter: Payer: Self-pay | Admitting: Internal Medicine

## 2022-07-07 DIAGNOSIS — F411 Generalized anxiety disorder: Secondary | ICD-10-CM

## 2022-07-07 NOTE — Patient Instructions (Signed)
Please take Xanax as needed for panic episode.  Please take Vitamin B12 1000 mcg once daily.

## 2022-07-07 NOTE — Progress Notes (Signed)
Virtual Visit via Telephone Note   This visit type was conducted due to national recommendations for restrictions regarding the COVID-19 Pandemic (e.g. social distancing) in an effort to limit this patient's exposure and mitigate transmission in our community.  Due to her co-morbid illnesses, this patient is at least at moderate risk for complications without adequate follow up.  This format is felt to be most appropriate for this patient at this time.  The patient did not have access to video technology/had technical difficulties with video requiring transitioning to audio format only (telephone).  All issues noted in this document were discussed and addressed.  No physical exam could be performed with this format.  Evaluation Performed:  Follow-up visit  Date:  07/07/2022   ID:  Jacqueline [name], DOB 1976-06-12, MRN [id number]  Patient Location: Home Provider Location: Office/Clinic  Participants: Patient Location of Patient: Home Location of Provider: Telehealth Consent was obtain for visit to be over via telehealth. I verified that I am speaking with the correct person using two identifiers.  PCP:  Donell Beers, FNP   Chief Complaint: Pinprick sensation in whole body  History of Present Illness:    Jacqueline [name] is a 46 y.o. female who has a televisit for complaint of pinprick sensation over her body for the last 2 weeks.  Of note, she has been stressed lately since her tick bite.  She went to ER and was given Ativan for anxiety spell, after which she felt better.  She currently is followed by psychiatry for GAD.  Denies any new medication for GAD.  Denies any new rash, fever or chills.  The patient does not have symptoms concerning for COVID-19 infection (fever, chills, cough, or new shortness of breath).   Past Medical, Surgical, Social History, Allergies, and Medications have been Reviewed.  Past Medical History:  Diagnosis Date   Annual visit for general adult medical  examination with abnormal findings 02/22/2020   Encounter for TB tine test 02/22/2020   GERD (gastroesophageal reflux disease)    Kidney stones    hx UTI   Need for immunization against influenza 12/29/2019   Sinusitis    URI (upper respiratory infection)    UTI (lower urinary tract infection)    Past Surgical History:  Procedure Laterality Date   DILATION AND CURETTAGE OF UTERUS     TUBAL LIGATION       Current Meds  Medication Sig   busPIRone (BUSPAR) 10 MG tablet Take 10 mg by mouth 2 (two) times daily.   FLUoxetine (PROZAC) 10 MG tablet Take 1 tablet (10 mg total) by mouth daily.   fluticasone (FLONASE) 50 MCG/ACT nasal spray Place 2 sprays into both nostrils daily.   gabapentin (NEURONTIN) 300 MG capsule Take 300 mg by mouth 3 (three) times daily as needed.   hydrOXYzine (VISTARIL) 25 MG capsule Take 1 capsule (25 mg total) by mouth every 8 (eight) hours as needed.   ibuprofen (ADVIL) 600 MG tablet Take 1 tablet (600 mg total) by mouth every 8 (eight) hours as needed.   lamoTRIgine (LAMICTAL) 150 MG tablet Take 150 mg by mouth daily.   sodium chloride (OCEAN) 0.65 % SOLN nasal spray Place 1 spray into both nostrils as needed for congestion.     Allergies:   Penicillins   ROS:   Please see the history of present illness.     All other systems reviewed and are negative.   Labs/Other Tests and Data Reviewed:  Recent Labs: 11/18/2021: ALT 15; BUN 8; Creatinine, Ser 0.70; Potassium 4.6; Sodium 139; TSH 1.480 06/12/2022: Hemoglobin 14.9; Platelets 365   Recent Lipid Panel Lab Results  Component Value Date/Time   CHOL 213 (H) 06/12/2022 09:23 AM   TRIG 117 06/12/2022 09:23 AM   HDL 41 06/12/2022 09:23 AM   CHOLHDL 5.2 (H) 06/12/2022 09:23 AM   CHOLHDL 4.2 02/23/2020 08:03 AM   LDLCALC 151 (H) 06/12/2022 09:23 AM   LDLCALC 128 (H) 02/23/2020 08:03 AM    Wt Readings from Last 3 Encounters:  06/24/22 177 lb 6.4 oz (80.5 kg)  06/19/22 180 lb (81.6 kg)  02/11/22 179  lb 1.9 oz (81.2 kg)     ASSESSMENT & PLAN:    GAD Her pinprick sensation is generalized, which is likely due to GAD/panic disorder rather than neurologic concern She was on Xanax as needed in the past, needs to take it for panic episode On Prozac and buspirone Continue Lamictal, gabapentin and Vistaril Reassured that she has been treated for tickborne related common infections  Time:   Today, I have spent 16 minutes reviewing the chart, including problem list, medications, and with the patient with telehealth technology discussing the above problems.   Medication Adjustments/Labs and Tests Ordered: Current medicines are reviewed at length with the patient today.  Concerns regarding medicines are outlined above.   Tests Ordered: No orders of the defined types were placed in this encounter.   Medication Changes: No orders of the defined types were placed in this encounter.    Note: This dictation was prepared with Dragon dictation along with smaller phrase technology. Similar sounding words can be transcribed inadequately or may not be corrected upon review. Any transcriptional errors that result from this process are unintentional.      Disposition:  Follow up  Signed, Anabel Halon, MD  07/07/2022 12:03 PM     [facility]

## 2022-07-14 DIAGNOSIS — F341 Dysthymic disorder: Secondary | ICD-10-CM | POA: Diagnosis not present

## 2022-07-14 DIAGNOSIS — F411 Generalized anxiety disorder: Secondary | ICD-10-CM | POA: Diagnosis not present

## 2022-07-14 DIAGNOSIS — F4381 Prolonged grief disorder: Secondary | ICD-10-CM | POA: Diagnosis not present

## 2022-07-14 DIAGNOSIS — F4312 Post-traumatic stress disorder, chronic: Secondary | ICD-10-CM | POA: Diagnosis not present

## 2022-07-21 DIAGNOSIS — F341 Dysthymic disorder: Secondary | ICD-10-CM | POA: Diagnosis not present

## 2022-07-21 DIAGNOSIS — F4312 Post-traumatic stress disorder, chronic: Secondary | ICD-10-CM | POA: Diagnosis not present

## 2022-07-21 DIAGNOSIS — F4381 Prolonged grief disorder: Secondary | ICD-10-CM | POA: Diagnosis not present

## 2022-07-21 DIAGNOSIS — F411 Generalized anxiety disorder: Secondary | ICD-10-CM | POA: Diagnosis not present

## 2022-07-28 DIAGNOSIS — F4381 Prolonged grief disorder: Secondary | ICD-10-CM | POA: Diagnosis not present

## 2022-07-28 DIAGNOSIS — F341 Dysthymic disorder: Secondary | ICD-10-CM | POA: Diagnosis not present

## 2022-07-28 DIAGNOSIS — F411 Generalized anxiety disorder: Secondary | ICD-10-CM | POA: Diagnosis not present

## 2022-07-28 DIAGNOSIS — F4312 Post-traumatic stress disorder, chronic: Secondary | ICD-10-CM | POA: Diagnosis not present

## 2022-08-01 DIAGNOSIS — F331 Major depressive disorder, recurrent, moderate: Secondary | ICD-10-CM | POA: Diagnosis not present

## 2022-08-01 DIAGNOSIS — F410 Panic disorder [episodic paroxysmal anxiety] without agoraphobia: Secondary | ICD-10-CM | POA: Diagnosis not present

## 2022-08-01 DIAGNOSIS — F411 Generalized anxiety disorder: Secondary | ICD-10-CM | POA: Diagnosis not present

## 2022-08-04 ENCOUNTER — Telehealth: Payer: Self-pay | Admitting: Nurse Practitioner

## 2022-08-04 DIAGNOSIS — F341 Dysthymic disorder: Secondary | ICD-10-CM | POA: Diagnosis not present

## 2022-08-04 DIAGNOSIS — F4381 Prolonged grief disorder: Secondary | ICD-10-CM | POA: Diagnosis not present

## 2022-08-04 DIAGNOSIS — F411 Generalized anxiety disorder: Secondary | ICD-10-CM | POA: Diagnosis not present

## 2022-08-04 DIAGNOSIS — F4312 Post-traumatic stress disorder, chronic: Secondary | ICD-10-CM | POA: Diagnosis not present

## 2022-08-04 NOTE — Telephone Encounter (Signed)
[facility] patient    Pt called stating she is needing a copy of her most recent physical and tb test for her job. Wants to know if you can please print this off & call her when ready?

## 2022-08-04 NOTE — Telephone Encounter (Signed)
Spoke to pt, encounter visit notes printed with tb test, set at the front for pick up.

## 2022-08-11 ENCOUNTER — Ambulatory Visit: Payer: Medicaid Other | Admitting: Nurse Practitioner

## 2022-08-11 DIAGNOSIS — F4381 Prolonged grief disorder: Secondary | ICD-10-CM | POA: Diagnosis not present

## 2022-08-11 DIAGNOSIS — F341 Dysthymic disorder: Secondary | ICD-10-CM | POA: Diagnosis not present

## 2022-08-11 DIAGNOSIS — F4312 Post-traumatic stress disorder, chronic: Secondary | ICD-10-CM | POA: Diagnosis not present

## 2022-08-11 DIAGNOSIS — F411 Generalized anxiety disorder: Secondary | ICD-10-CM | POA: Diagnosis not present

## 2022-08-12 ENCOUNTER — Ambulatory Visit: Payer: Medicaid Other | Admitting: Nurse Practitioner

## 2022-08-12 ENCOUNTER — Encounter: Payer: Self-pay | Admitting: Nurse Practitioner

## 2022-08-12 VITALS — BP 155/102 | HR 97 | Ht 60.0 in | Wt 175.0 lb

## 2022-08-12 DIAGNOSIS — F321 Major depressive disorder, single episode, moderate: Secondary | ICD-10-CM

## 2022-08-12 DIAGNOSIS — Z720 Tobacco use: Secondary | ICD-10-CM

## 2022-08-12 DIAGNOSIS — F419 Anxiety disorder, unspecified: Secondary | ICD-10-CM | POA: Diagnosis not present

## 2022-08-12 DIAGNOSIS — R030 Elevated blood-pressure reading, without diagnosis of hypertension: Secondary | ICD-10-CM | POA: Diagnosis not present

## 2022-08-12 NOTE — Assessment & Plan Note (Signed)
uncontrolled condition PHQ9 score 14. Gad 7 score 16.  Chronic uncontrolled condition currently on buspirone 15 mg twice daily, Lamictal 100 mg daily She would like to come off Start buspirone 7.5 mg twice daily for 1 week On the second week take buspirone 7.5 mg once daily for a week and then stop Start Prozac 10 mg daily Continue Lamictal 100 mg daily Patient encouraged to maintain close follow-up with psychiatrist she verbalized understanding

## 2022-08-12 NOTE — Assessment & Plan Note (Signed)
BP Readings from Last 3 Encounters:  08/12/22 (!) 155/102  06/26/22 (!) 110/99  06/24/22 (!) 148/100  most likely elevated due to her uncontrolled anxiety and depression  Monitor BP daily , keep a log and bring to next follow up in 6 weeks  DASH diet advised , engage in regular moderate exercises at least 150 minutes weekly

## 2022-08-12 NOTE — Patient Instructions (Addendum)
Week 1. Please take buspirone 7.5mg  twice daily  for one week  Week 2. Take buspirone 7.5mg  daily for one week and stop.   Start taking prozac 10mg  daily   Please start checking your blood pressure at home keep a log and bring to your next appointment in 4 week. Nurse please order BP cuff   It is important that you exercise regularly at least 30 minutes 5 times a week.  Think about what you will eat, plan ahead. Choose " clean, green, fresh or frozen" over canned, processed or packaged foods which are more sugary, salty and fatty. 70 to 75% of food eaten should be vegetables and fruit. Three meals at set times with snacks allowed between meals, but they must be fruit or vegetables. Aim to eat over a 12 hour period , example 7 am to 7 pm, and STOP after  your last meal of the day. Drink water,generally about 64 ounces per day, no other drink is as healthy. Fruit juice is best enjoyed in a healthy way, by EATING the fruit.  Thanks for choosing [facility], we consider it a privelige to serve you.

## 2022-08-12 NOTE — Assessment & Plan Note (Addendum)
Chronic uncontrolled PHQ9 score 14. Gad 7 score 16.  Chronic uncontrolled condition currently on buspirone 15 mg twice daily, Lamictal 100 mg daily She would like to come off Start buspirone 7.5 mg twice daily for 1 week On the second week take buspirone 7.5 mg once daily then stop Start Prozac 10 mg daily Continue Lamictal 100 mg daily Patient encouraged to maintain close follow-up with psychiatrist she verbalized understanding

## 2022-08-12 NOTE — Assessment & Plan Note (Signed)
Metrics: Intervention Frequency ACO  Smokes about 1 pack/day  Asked about quitting: confirms that he/she currently smokes cigarettes Advise to quit smoking: Educated about QUITTING to reduce the risk of cancer, cardio and cerebrovascular disease. Assess willingness: Unwilling to quit at this time, but is working on cutting back, feels that getting her anxiety and depression under control will assist with quitting Assist with counseling and pharmacotherapy: Counseled for 5 minutes and literature provided. Arrange for follow up: follow up in 6 weeks and continue to offer help.

## 2022-08-12 NOTE — Progress Notes (Signed)
Jacqueline [name]     MRN: [id number]      DOB: 08/22/1976   HPI Jacqueline [name] with past medical history of nicotine abuse, depression, anxiety, is here for follow up for anxiety and depression.  Currently on buspirone 15 mg twice daily, hydroxyzine 25 mg every 8 hours as needed, lamotrigine 100 mg daily.  Patient stated that her blood pressure has been running high since she started taking buspirone.  She has been following up with psych once monthly.  Patient would like to come off buspirone and start Prozac.  Patient, denies HI SI.  Has been struggling with depression and anxiety since she lost her fianc in 2020.       ROS Denies recent fever or chills. Denies sinus pressure, nasal congestion, ear pain or sore throat. Denies chest congestion, productive cough or wheezing. Denies chest pains, palpitations and leg swelling Denies abdominal pain, nausea, vomiting,diarrhea or constipation.   Denies dysuria, frequency, hesitancy or incontinence. Denies joint pain, swelling and limitation in mobility. Denies headaches, seizures, numbness, or tingling.   PE  BP (!) 156/108 (BP Location: Left Arm, Cuff Size: Normal)   Pulse 97   Ht 5' (1.524 m)   Wt 175 lb (79.4 kg)   SpO2 97%   BMI 34.18 kg/m   Patient alert and oriented and in no cardiopulmonary distress.   Chest: Clear to auscultation bilaterally.  CVS: S1, S2 no murmurs, no S3.Regular rate.  ABD: Soft non tender.   Ext: No edema  MS: Adequate ROM spine, shoulders, hips and knees.   Psych: Good eye contact, normal affect. Memory intact. Patient is anxious and depressed appearing, tearful during todays exam.  CNS: CN 2-12 intact, power,  normal throughout.no focal deficits noted.   Assessment & Plan  Anxiety Chronic uncontrolled PHQ9 score 14. Gad 7 score 16.  Chronic uncontrolled condition currently on buspirone 15 mg twice daily, Lamictal 100 mg daily She would like to come off Start buspirone 7.5 mg twice daily for 1  week On the second week take buspirone 7.5 mg once daily then stop Start Prozac 10 mg daily Continue Lamictal 100 mg daily Patient encouraged to maintain close follow-up with psychiatrist she verbalized understanding  Depression, major, single episode, moderate (HCC) uncontrolled condition PHQ9 score 14. Gad 7 score 16.  Chronic uncontrolled condition currently on buspirone 15 mg twice daily, Lamictal 100 mg daily She would like to come off Start buspirone 7.5 mg twice daily for 1 week On the second week take buspirone 7.5 mg once daily for a week and then stop Start Prozac 10 mg daily Continue Lamictal 100 mg daily Patient encouraged to maintain close follow-up with psychiatrist she verbalized understanding  Elevated blood pressure reading BP Readings from Last 3 Encounters:  08/12/22 (!) 155/102  06/26/22 (!) 110/99  06/24/22 (!) 148/100  most likely elevated due to her uncontrolled anxiety and depression  Monitor BP daily , keep a log and bring to next follow up in 6 weeks  DASH diet advised , engage in regular moderate exercises at least 150 minutes weekly   Nicotine abuse Metrics: Intervention Frequency ACO  Smokes about 1 pack/day  Asked about quitting: confirms that he/she currently smokes cigarettes Advise to quit smoking: Educated about QUITTING to reduce the risk of cancer, cardio and cerebrovascular disease. Assess willingness: Unwilling to quit at this time, but is working on cutting back, feels that getting her anxiety and depression under control will assist with quitting Assist with counseling and  pharmacotherapy: Counseled for 5 minutes and literature provided. Arrange for follow up: follow up in 6 weeks and continue to offer help.

## 2022-08-18 DIAGNOSIS — F411 Generalized anxiety disorder: Secondary | ICD-10-CM | POA: Diagnosis not present

## 2022-08-18 DIAGNOSIS — F4381 Prolonged grief disorder: Secondary | ICD-10-CM | POA: Diagnosis not present

## 2022-08-18 DIAGNOSIS — F341 Dysthymic disorder: Secondary | ICD-10-CM | POA: Diagnosis not present

## 2022-08-18 DIAGNOSIS — F4312 Post-traumatic stress disorder, chronic: Secondary | ICD-10-CM | POA: Diagnosis not present

## 2022-08-25 DIAGNOSIS — F411 Generalized anxiety disorder: Secondary | ICD-10-CM | POA: Diagnosis not present

## 2022-08-25 DIAGNOSIS — F4381 Prolonged grief disorder: Secondary | ICD-10-CM | POA: Diagnosis not present

## 2022-08-25 DIAGNOSIS — F341 Dysthymic disorder: Secondary | ICD-10-CM | POA: Diagnosis not present

## 2022-08-25 DIAGNOSIS — F4312 Post-traumatic stress disorder, chronic: Secondary | ICD-10-CM | POA: Diagnosis not present

## 2022-09-01 DIAGNOSIS — F341 Dysthymic disorder: Secondary | ICD-10-CM | POA: Diagnosis not present

## 2022-09-01 DIAGNOSIS — F4312 Post-traumatic stress disorder, chronic: Secondary | ICD-10-CM | POA: Diagnosis not present

## 2022-09-01 DIAGNOSIS — F411 Generalized anxiety disorder: Secondary | ICD-10-CM | POA: Diagnosis not present

## 2022-09-01 DIAGNOSIS — F4381 Prolonged grief disorder: Secondary | ICD-10-CM | POA: Diagnosis not present

## 2022-09-09 DIAGNOSIS — F4381 Prolonged grief disorder: Secondary | ICD-10-CM | POA: Diagnosis not present

## 2022-09-09 DIAGNOSIS — F411 Generalized anxiety disorder: Secondary | ICD-10-CM | POA: Diagnosis not present

## 2022-09-09 DIAGNOSIS — F341 Dysthymic disorder: Secondary | ICD-10-CM | POA: Diagnosis not present

## 2022-09-09 DIAGNOSIS — F4312 Post-traumatic stress disorder, chronic: Secondary | ICD-10-CM | POA: Diagnosis not present

## 2022-09-10 ENCOUNTER — Telehealth: Payer: Self-pay

## 2022-09-10 NOTE — Telephone Encounter (Signed)
Patient called needs a new referral to another psychiatry other than [facility] no longer being seen at [facility].

## 2022-09-11 ENCOUNTER — Other Ambulatory Visit: Payer: Self-pay

## 2022-09-11 DIAGNOSIS — F321 Major depressive disorder, single episode, moderate: Secondary | ICD-10-CM

## 2022-09-11 DIAGNOSIS — F419 Anxiety disorder, unspecified: Secondary | ICD-10-CM

## 2022-09-11 NOTE — Telephone Encounter (Signed)
Please advise 

## 2022-09-11 NOTE — Telephone Encounter (Signed)
Referral placed... pt advised.  

## 2022-09-15 DIAGNOSIS — F341 Dysthymic disorder: Secondary | ICD-10-CM | POA: Diagnosis not present

## 2022-09-15 DIAGNOSIS — F4312 Post-traumatic stress disorder, chronic: Secondary | ICD-10-CM | POA: Diagnosis not present

## 2022-09-15 DIAGNOSIS — F4381 Prolonged grief disorder: Secondary | ICD-10-CM | POA: Diagnosis not present

## 2022-09-15 DIAGNOSIS — F411 Generalized anxiety disorder: Secondary | ICD-10-CM | POA: Diagnosis not present

## 2022-09-16 ENCOUNTER — Encounter: Payer: Self-pay | Admitting: Family Medicine

## 2022-09-16 ENCOUNTER — Ambulatory Visit (INDEPENDENT_AMBULATORY_CARE_PROVIDER_SITE_OTHER): Payer: Medicaid Other | Admitting: Family Medicine

## 2022-09-16 DIAGNOSIS — F419 Anxiety disorder, unspecified: Secondary | ICD-10-CM | POA: Diagnosis not present

## 2022-09-16 MED ORDER — HYDROXYZINE PAMOATE 25 MG PO CAPS: 25.0000 mg | ORAL_CAPSULE | Freq: Three times a day (TID) | ORAL | 2 refills | Status: DC | PRN

## 2022-09-16 NOTE — Progress Notes (Signed)
Virtual Visit via Telephone Note   This visit type was conducted due to national recommendations for restrictions regarding the COVID-19 Pandemic (e.g. social distancing) in an effort to limit this patient's exposure and mitigate transmission in our community.  Due to her co-morbid illnesses, this patient is at least at moderate risk for complications without adequate follow up.  This format is felt to be most appropriate for this patient at this time.  The patient did not have access to video technology/had technical difficulties with video requiring transitioning to audio format only (telephone).  All issues noted in this document were discussed and addressed.  No physical exam could be performed with this format.  Please refer to the patient's chart for her  consent to telehealth for [facility].   Evaluation Performed:  Follow-up visit  Date:  09/16/2022   ID:  Jacqueline [name], DOB Feb 06, 1976, MRN [id number]  Patient Location: Home Provider Location: Office/Clinic  Participants: Patient Location of Patient: Home Location of Provider: Telehealth Consent was obtain for visit to be over via telehealth. I verified that I am speaking with the correct person using two identifiers.  PCP:  Alvira Monday, FNP   Chief Complaint:  anxiety  History of Present Illness:    Jacqueline [name] is a 46 y.o. female with hx of anxiety and requesting meds refills. She reports taking hydroxyzine 25 mg as needed, stating relief of symptoms with treatments.  The patient does not have symptoms concerning for COVID-19 infection (fever, chills, cough, or new shortness of breath).   Past Medical, Surgical, Social History, Allergies, and Medications have been Reviewed.  Past Medical History:  Diagnosis Date   Annual visit for general adult medical examination with abnormal findings 02/22/2020   Encounter for TB tine test 02/22/2020   GERD (gastroesophageal reflux disease)    Kidney stones    hx UTI    Need for immunization against influenza 12/29/2019   Sinusitis    URI (upper respiratory infection)    UTI (lower urinary tract infection)    Past Surgical History:  Procedure Laterality Date   DILATION AND CURETTAGE OF UTERUS     TUBAL LIGATION       Current Meds  Medication Sig   busPIRone (BUSPAR) 15 MG tablet Take 15 mg by mouth 2 (two) times daily.   fluticasone (FLONASE) 50 MCG/ACT nasal spray Place 2 sprays into both nostrils daily.   gabapentin (NEURONTIN) 300 MG capsule Take 300 mg by mouth 3 (three) times daily as needed.   ibuprofen (ADVIL) 600 MG tablet Take 1 tablet (600 mg total) by mouth every 8 (eight) hours as needed.   lamoTRIgine (LAMICTAL) 100 MG tablet Take 100 mg by mouth daily.   sodium chloride (OCEAN) 0.65 % SOLN nasal spray Place 1 spray into both nostrils as needed for congestion.   [DISCONTINUED] FLUoxetine (PROZAC) 10 MG tablet Take 1 tablet (10 mg total) by mouth daily.   [DISCONTINUED] hydrOXYzine (VISTARIL) 25 MG capsule Take 1 capsule (25 mg total) by mouth every 8 (eight) hours as needed.     Allergies:   Penicillins   ROS:   Please see the history of present illness.     All other systems reviewed and are negative.   Labs/Other Tests and Data Reviewed:    Recent Labs: 11/18/2021: ALT 15; BUN 8; Creatinine, Ser 0.70; Potassium 4.6; Sodium 139; TSH 1.480 06/12/2022: Hemoglobin 14.9; Platelets 365   Recent Lipid Panel Lab Results  Component Value Date/Time  CHOL 213 (H) 06/12/2022 09:23 AM   TRIG 117 06/12/2022 09:23 AM   HDL 41 06/12/2022 09:23 AM   CHOLHDL 5.2 (H) 06/12/2022 09:23 AM   CHOLHDL 4.2 02/23/2020 08:03 AM   LDLCALC 151 (H) 06/12/2022 09:23 AM   LDLCALC 128 (H) 02/23/2020 08:03 AM    Wt Readings from Last 3 Encounters:  08/12/22 175 lb (79.4 kg)  06/24/22 177 lb 6.4 oz (80.5 kg)  06/19/22 180 lb (81.6 kg)     Objective:    Vital Signs:  There were no vitals taken for this visit.     ASSESSMENT & PLAN:    Anxiety Denies SI and HI Refilled hydroxyzine 25 mg PRN  Time:   Today, I have spent 8 minutes reviewing the chart, including problem list, medications, and with the patient with telehealth technology discussing the above problems.   Medication Adjustments/Labs and Tests Ordered: Current medicines are reviewed at length with the patient today.  Concerns regarding medicines are outlined above.   Tests Ordered: No orders of the defined types were placed in this encounter.   Medication Changes: Meds ordered this encounter  Medications   hydrOXYzine (VISTARIL) 25 MG capsule    Sig: Take 1 capsule (25 mg total) by mouth every 8 (eight) hours as needed.    Dispense:  30 capsule    Refill:  2     Note: This dictation was prepared with Dragon dictation along with smaller phrase technology. Similar sounding words can be transcribed inadequately or may not be corrected upon review. Any transcriptional errors that result from this process are unintentional.      Disposition:  Follow up  Signed, Gilmore Laroche, FNP  09/16/2022 11:53 AM     [facility]

## 2022-09-22 DIAGNOSIS — F341 Dysthymic disorder: Secondary | ICD-10-CM | POA: Diagnosis not present

## 2022-09-22 DIAGNOSIS — F4312 Post-traumatic stress disorder, chronic: Secondary | ICD-10-CM | POA: Diagnosis not present

## 2022-09-22 DIAGNOSIS — F4381 Prolonged grief disorder: Secondary | ICD-10-CM | POA: Diagnosis not present

## 2022-09-22 DIAGNOSIS — F411 Generalized anxiety disorder: Secondary | ICD-10-CM | POA: Diagnosis not present

## 2022-09-23 ENCOUNTER — Ambulatory Visit: Payer: Medicaid Other | Admitting: Nurse Practitioner

## 2022-09-26 ENCOUNTER — Ambulatory Visit: Payer: Medicaid Other | Admitting: Family Medicine

## 2022-09-29 ENCOUNTER — Telehealth: Payer: Self-pay | Admitting: Family Medicine

## 2022-09-29 DIAGNOSIS — F411 Generalized anxiety disorder: Secondary | ICD-10-CM | POA: Diagnosis not present

## 2022-09-29 DIAGNOSIS — F341 Dysthymic disorder: Secondary | ICD-10-CM | POA: Diagnosis not present

## 2022-09-29 DIAGNOSIS — F4381 Prolonged grief disorder: Secondary | ICD-10-CM | POA: Diagnosis not present

## 2022-09-29 DIAGNOSIS — F4312 Post-traumatic stress disorder, chronic: Secondary | ICD-10-CM | POA: Diagnosis not present

## 2022-09-29 NOTE — Telephone Encounter (Signed)
Patient called in regard to psych referral . [facility] has long wait list  Patient wants referral sent into [facility].  Pt also wants call back when placed

## 2022-09-30 ENCOUNTER — Other Ambulatory Visit: Payer: Self-pay

## 2022-09-30 DIAGNOSIS — F321 Major depressive disorder, single episode, moderate: Secondary | ICD-10-CM

## 2022-09-30 NOTE — Telephone Encounter (Signed)
Referral has been placed, tried calling pt to let her know vm full.

## 2022-09-30 NOTE — Telephone Encounter (Signed)
Yes

## 2022-10-01 ENCOUNTER — Ambulatory Visit: Payer: Medicaid Other | Admitting: Family Medicine

## 2022-10-03 ENCOUNTER — Ambulatory Visit: Payer: Medicaid Other | Admitting: Family Medicine

## 2022-10-03 ENCOUNTER — Encounter: Payer: Self-pay | Admitting: Family Medicine

## 2022-10-03 VITALS — BP 122/82 | HR 108 | Ht 63.0 in | Wt 179.1 lb

## 2022-10-03 DIAGNOSIS — F419 Anxiety disorder, unspecified: Secondary | ICD-10-CM | POA: Diagnosis not present

## 2022-10-03 DIAGNOSIS — R7301 Impaired fasting glucose: Secondary | ICD-10-CM

## 2022-10-03 DIAGNOSIS — N951 Menopausal and female climacteric states: Secondary | ICD-10-CM

## 2022-10-03 DIAGNOSIS — E038 Other specified hypothyroidism: Secondary | ICD-10-CM

## 2022-10-03 DIAGNOSIS — E559 Vitamin D deficiency, unspecified: Secondary | ICD-10-CM

## 2022-10-03 MED ORDER — PAROXETINE MESYLATE 7.5 MG PO CAPS: 1.0000 | ORAL_CAPSULE | Freq: Every day | ORAL | 0 refills | Status: DC

## 2022-10-03 NOTE — Assessment & Plan Note (Signed)
Will start patient on Paxil 7.5 mg daily for vasomotor symptoms of hot flashes Encouraged to take over-the-counter MiraLAX and Gas-X for bloating and constipation Encouraged to increase her intake of fiber and fluid consumption Recommended to drain at least 64 ounces of fluids daily

## 2022-10-03 NOTE — Progress Notes (Signed)
paxil  Established Patient Office Visit  Subjective:  Patient ID: Jacqueline [name], female    DOB: 1976/01/26  Age: 46 y.o. MRN: [id number]  CC:  Chief Complaint  Patient presents with   Follow-up    Follow up, pt reports hot flashes she thinks she is going through menopause. Reports constipation sx, goes one day then takes a few days for her to be able to have a bowel movement.     HPI Jacqueline [name] is a 47 y.o. female with past medical history of anxiety, obesity, and depression presents for f/u of  chronic medical conditions.  Generalized anxiety disorder: GAD-7 is 6. She takes BuSpar 15 mg twice daily. She reports compliance with treatment regiment.  She denies suicidal ideation and homicidal ideations.  Premenopausal vasomotor symptoms: She complains of symptoms of hot flashes, noting that she has only had 3 menstrual cycles this year.  She also complains of constipation and bloating.  She reports minimal fiber intake and fluid consumption.     Past Medical History:  Diagnosis Date   Annual visit for general adult medical examination with abnormal findings 02/22/2020   Encounter for TB tine test 02/22/2020   GERD (gastroesophageal reflux disease)    Kidney stones    hx UTI   Need for immunization against influenza 12/29/2019   Sinusitis    URI (upper respiratory infection)    UTI (lower urinary tract infection)     Past Surgical History:  Procedure Laterality Date   DILATION AND CURETTAGE OF UTERUS     TUBAL LIGATION      Family History  Problem Relation Age of Onset   Diabetes Other    Heart disease Mother    Diabetes Maternal Aunt    Cancer Paternal Aunt        lymph node, breast, and ovarian   Breast cancer Paternal Aunt    Diabetes Maternal Grandmother    Heart disease Maternal Grandmother    Heart failure Maternal Grandmother    Heart attack Maternal Grandfather    Cancer Paternal Grandmother        breast cancer   Breast cancer Paternal Grandmother     Cancer Paternal Grandfather        lung cancer    Social History   Socioeconomic History   Marital status: Single    Spouse name: Not on file   Number of children: 1   Years of education: Not on file   Highest education level: GED or equivalent  Occupational History   Not on file  Tobacco Use   Smoking status: Every Day    Packs/day: 0.50    Years: 5.00    Total pack years: 2.50    Types: Cigarettes    Last attempt to quit: 05/08/2010    Years since quitting: 12.4   Smokeless tobacco: Never  Vaping Use   Vaping Use: Never used  Substance and Sexual Activity   Alcohol use: No   Drug use: No   Sexual activity: Not Currently    Birth control/protection: Surgical  Other Topics Concern   Not on file  Social History Narrative   Lives with son Cornelia Copa is 53 years old.   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     gabapentin (NEURONTIN) 300 MG capsule Take 300 mg by mouth 3 (three) times daily as needed.     hydrOXYzine (VISTARIL) 25 MG capsule Take 1 capsule (25 mg total) by mouth every 8 (eight) hours as needed. 30 capsule 2   lamoTRIgine (LAMICTAL) 100 MG tablet Take 100 mg by mouth daily.     sodium chloride (OCEAN) 0.65 % SOLN nasal spray Place 1 spray into both nostrils as needed for congestion.  15 mL 0   ibuprofen (ADVIL) 600 MG tablet Take 1 tablet (600 mg total) by mouth every 8 (eight) hours as needed. 30 tablet 0   fluticasone (FLONASE) 50 MCG/ACT nasal spray Place 2 sprays into both nostrils daily. 16 g 2   traZODone (DESYREL) 50 MG tablet Take 50 mg by mouth at bedtime. (Patient not taking: Reported on 10/03/2022)     No facility-administered medications prior to visit.    Allergies  Allergen Reactions   Penicillins Nausea And Vomiting and Rash    ROS Review of Systems  Constitutional:  Negative for chills and fatigue.  Eyes:  Negative for pain and visual disturbance.  Respiratory:  Negative for chest tightness and shortness of breath.   Cardiovascular:  Negative for chest pain and palpitations.  Neurological:  Negative for dizziness and headaches.      Objective:    Physical Exam Constitutional:      Appearance: She is obese.  HENT:     Head: Normocephalic.     Mouth/Throat:     Mouth: Mucous membranes are moist.  Cardiovascular:     Rate and Rhythm: Normal rate and regular rhythm.     Pulses: Normal pulses.     Heart sounds: Normal heart sounds.  Pulmonary:     Effort: Pulmonary effort is normal.     Breath sounds: Normal breath sounds.  Neurological:     Mental Status: She is alert.     BP 122/82   Pulse (!) 108   Ht 5' 3" (1.6 m)   Wt 179 lb 1.9 oz (81.2 kg)   LMP 07/24/2022   SpO2 96%   BMI 31.73 kg/m  Wt Readings from Last 3 Encounters:  10/03/22 179 lb 1.9 oz (81.2 kg)  08/12/22 175 lb (79.4 kg)  06/24/22 177 lb 6.4 oz (80.5 kg)    Lab Results  Component Value Date   TSH 1.480 11/18/2021   Lab Results  Component Value Date   WBC 13.1 (H) 06/12/2022   HGB 14.9 06/12/2022   HCT 44.1 06/12/2022   MCV 85 06/12/2022   PLT 365 06/12/2022   Lab Results  Component Value Date   NA 139 11/18/2021   K 4.6 11/18/2021   CO2 23 11/18/2021   GLUCOSE 93 11/18/2021   BUN 8 11/18/2021   CREATININE 0.70 11/18/2021   BILITOT 0.6  11/18/2021   ALKPHOS 96 11/18/2021   AST 18 11/18/2021  ALT 15 11/18/2021   PROT 7.7 11/18/2021   ALBUMIN 4.9 (H) 11/18/2021   CALCIUM 9.7 11/18/2021   ANIONGAP 6 09/19/2019   EGFR 110 11/18/2021   Lab Results  Component Value Date   CHOL 213 (H) 06/12/2022   Lab Results  Component Value Date   HDL 41 06/12/2022   Lab Results  Component Value Date   LDLCALC 151 (H) 06/12/2022   Lab Results  Component Value Date   TRIG 117 06/12/2022   Lab Results  Component Value Date   CHOLHDL 5.2 (H) 06/12/2022   Lab Results  Component Value Date   HGBA1C 5.8 (H) 11/18/2021      Assessment & Plan:   Problem List Items Addressed This Visit       Cardiovascular and Mediastinum   Perimenopausal vasomotor symptoms - Primary    Will start patient on Paxil 7.5 mg daily for vasomotor symptoms of hot flashes Encouraged to take over-the-counter MiraLAX and Gas-X for bloating and constipation Encouraged to increase her intake of fiber and fluid consumption Recommended to drain at least 64 ounces of fluids daily      Relevant Medications   PARoxetine Mesylate 7.5 MG CAPS     Other   Anxiety    GAD-7 is 6 Encouraged to continue taking BuSpar 15 mg twice daily She denies suicidal ideation homicidal ideations Encouraged to continue treatment regimen We will get labs today to assess patient's overall health      Vitamin D deficiency   Relevant Orders   Vitamin D (25 hydroxy)   Other Visit Diagnoses     IFG (impaired fasting glucose)       Relevant Orders   CBC with Differential/Platelet   CMP14+EGFR   Hemoglobin A1C   Lipid Profile   Other specified hypothyroidism       Relevant Orders   TSH + free T4       Meds ordered this encounter  Medications   PARoxetine Mesylate 7.5 MG CAPS    Sig: Take 1 tablet by mouth daily.    Dispense:  30 capsule    Refill:  0    Follow-up: Return in about 3 months (around 01/03/2023).    Alvira Monday, FNP

## 2022-10-03 NOTE — Patient Instructions (Signed)
I appreciate the opportunity to provide care to you today!    Follow up:  3 months  Labs: please stop by the lab during the week to get your blood drawn (CBC, CMP, TSH, Lipid profile, HgA1c, Vit D)  Please pick up your medication at the pharmacy   Please continue to a heart-healthy diet and increase your physical activities. Try to exercise for 30mins at least three times a week.      It was a pleasure to see you and I look forward to continuing to work together on your health and well-being. Please do not hesitate to call the office if you need care or have questions about your care.   Have a wonderful day and week. With Gratitude, Hendy Brindle MSN, FNP-BC  

## 2022-10-03 NOTE — Assessment & Plan Note (Addendum)
GAD-7 is 6 Encouraged to continue taking BuSpar 15 mg twice daily She denies suicidal ideation homicidal ideations Encouraged to continue treatment regimen We will get labs today to assess patient's overall health

## 2022-10-06 ENCOUNTER — Telehealth: Payer: Self-pay | Admitting: Family Medicine

## 2022-10-06 DIAGNOSIS — F4381 Prolonged grief disorder: Secondary | ICD-10-CM | POA: Diagnosis not present

## 2022-10-06 DIAGNOSIS — F4312 Post-traumatic stress disorder, chronic: Secondary | ICD-10-CM | POA: Diagnosis not present

## 2022-10-06 DIAGNOSIS — F411 Generalized anxiety disorder: Secondary | ICD-10-CM | POA: Diagnosis not present

## 2022-10-06 DIAGNOSIS — F341 Dysthymic disorder: Secondary | ICD-10-CM | POA: Diagnosis not present

## 2022-10-06 NOTE — Telephone Encounter (Signed)
Please advice on alternative? 

## 2022-10-06 NOTE — Telephone Encounter (Signed)
Pt called stating medication that was sent in last week is not covered by her insu. Can you please call in something different?     PARoxetine Mesylate 7.5 MG CAPS

## 2022-10-08 NOTE — Telephone Encounter (Signed)
Patient called back in to last tele   Cap form is not covered by insurance. Needs med sent in liquid form. Wants a cll back

## 2022-10-09 ENCOUNTER — Other Ambulatory Visit: Payer: Self-pay | Admitting: Family Medicine

## 2022-10-09 DIAGNOSIS — N951 Menopausal and female climacteric states: Secondary | ICD-10-CM

## 2022-10-09 MED ORDER — CITALOPRAM HYDROBROMIDE 10 MG PO TABS: 10.0000 mg | ORAL_TABLET | Freq: Every day | ORAL | 1 refills | Status: DC

## 2022-10-09 NOTE — Telephone Encounter (Signed)
Please inform the patient that a prescription for Celexa 10 mg once daily has been sent to her pharmacy.

## 2022-10-10 NOTE — Telephone Encounter (Signed)
Pt informed

## 2022-10-16 DIAGNOSIS — F341 Dysthymic disorder: Secondary | ICD-10-CM | POA: Diagnosis not present

## 2022-10-16 DIAGNOSIS — F411 Generalized anxiety disorder: Secondary | ICD-10-CM | POA: Diagnosis not present

## 2022-10-16 DIAGNOSIS — F4381 Prolonged grief disorder: Secondary | ICD-10-CM | POA: Diagnosis not present

## 2022-10-16 DIAGNOSIS — F4312 Post-traumatic stress disorder, chronic: Secondary | ICD-10-CM | POA: Diagnosis not present

## 2022-10-20 DIAGNOSIS — F341 Dysthymic disorder: Secondary | ICD-10-CM | POA: Diagnosis not present

## 2022-10-20 DIAGNOSIS — F411 Generalized anxiety disorder: Secondary | ICD-10-CM | POA: Diagnosis not present

## 2022-10-20 DIAGNOSIS — F4381 Prolonged grief disorder: Secondary | ICD-10-CM | POA: Diagnosis not present

## 2022-10-20 DIAGNOSIS — F4312 Post-traumatic stress disorder, chronic: Secondary | ICD-10-CM | POA: Diagnosis not present

## 2022-10-23 ENCOUNTER — Ambulatory Visit (INDEPENDENT_AMBULATORY_CARE_PROVIDER_SITE_OTHER): Payer: Medicaid Other | Admitting: Psychiatry

## 2022-10-23 ENCOUNTER — Encounter (HOSPITAL_COMMUNITY): Payer: Self-pay | Admitting: Psychiatry

## 2022-10-23 ENCOUNTER — Telehealth (HOSPITAL_COMMUNITY): Payer: Self-pay | Admitting: *Deleted

## 2022-10-23 DIAGNOSIS — F4310 Post-traumatic stress disorder, unspecified: Secondary | ICD-10-CM

## 2022-10-23 DIAGNOSIS — N951 Menopausal and female climacteric states: Secondary | ICD-10-CM

## 2022-10-23 DIAGNOSIS — F5081 Binge eating disorder, mild: Secondary | ICD-10-CM | POA: Insufficient documentation

## 2022-10-23 DIAGNOSIS — F411 Generalized anxiety disorder: Secondary | ICD-10-CM | POA: Diagnosis not present

## 2022-10-23 DIAGNOSIS — F5104 Psychophysiologic insomnia: Secondary | ICD-10-CM

## 2022-10-23 DIAGNOSIS — F410 Panic disorder [episodic paroxysmal anxiety] without agoraphobia: Secondary | ICD-10-CM

## 2022-10-23 DIAGNOSIS — R5382 Chronic fatigue, unspecified: Secondary | ICD-10-CM

## 2022-10-23 DIAGNOSIS — Z720 Tobacco use: Secondary | ICD-10-CM

## 2022-10-23 MED ORDER — LAMOTRIGINE 100 MG PO TABS: 100.0000 mg | ORAL_TABLET | Freq: Every day | ORAL | 1 refills | Status: DC

## 2022-10-23 MED ORDER — BUSPIRONE HCL 7.5 MG PO TABS: 7.5000 mg | ORAL_TABLET | Freq: Two times a day (BID) | ORAL | 1 refills | Status: DC

## 2022-10-23 NOTE — Telephone Encounter (Signed)
Opened in Error.

## 2022-10-23 NOTE — Telephone Encounter (Signed)
Patient stated she will find another provider. Per patient she wants the time for her appt to be a 4pm appt. Staff informed her that the 4pm appts for provider is scheduled for New Patients. Per pt well she can not accommodate office due to it just being her and her son and she have to work. Per pt she's just going to have to find another provider because she can not do any appt before 4pm. Patient stated to staff it is fine for staff to inform provider with this and then hung up.    Staff tried calling patient back to see if there could be an exception for her to do her appt at 4pm instead that slot being for new patient but patient did not pick up the phone and staff is not able to leave message due to no voicemail being full.
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
## 2022-10-23 NOTE — Patient Instructions (Signed)
We discontinued your hydroxyzine today, instead continue to use your gabapentin 300mg  three times a day as needed for anxiety, this can also be used for insomnia. Otherwise, we continued your lamictal and buspar as you've been taking them. We discontinued the citalopram for now. Dr. Nehemiah Settle placed a referral for psychotherapy.

## 2022-10-27 DIAGNOSIS — F341 Dysthymic disorder: Secondary | ICD-10-CM | POA: Diagnosis not present

## 2022-10-27 DIAGNOSIS — F411 Generalized anxiety disorder: Secondary | ICD-10-CM | POA: Diagnosis not present

## 2022-10-27 DIAGNOSIS — F4381 Prolonged grief disorder: Secondary | ICD-10-CM | POA: Diagnosis not present

## 2022-10-27 DIAGNOSIS — F4312 Post-traumatic stress disorder, chronic: Secondary | ICD-10-CM | POA: Diagnosis not present

## 2022-11-04 ENCOUNTER — Ambulatory Visit (INDEPENDENT_AMBULATORY_CARE_PROVIDER_SITE_OTHER): Payer: Medicaid Other | Admitting: Family Medicine

## 2022-11-04 ENCOUNTER — Encounter: Payer: Self-pay | Admitting: Family Medicine

## 2022-11-04 DIAGNOSIS — G479 Sleep disorder, unspecified: Secondary | ICD-10-CM | POA: Diagnosis not present

## 2022-11-04 NOTE — Progress Notes (Unsigned)
Virtual Visit via Telephone Note   This visit type was conducted via telephone. This format is felt to be most appropriate for this patient at this time.  The patient did not have access to video technology/had technical difficulties with video requiring transitioning to audio format only (telephone).  All issues noted in this document were discussed and addressed.  No physical exam could be performed with this format.  Evaluation Performed:  Follow-up visit  Date:  11/05/2022   ID:  Jacqueline [name], DOB 01/09/1976, MRN [id number]  Patient Location: Home Provider Location: Clinic  Participants: Patient Location of Patient: Home Location of Provider: clinic Consent was obtain for visit to be over via telehealth. I verified that I am speaking with the correct person using two identifiers.  PCP:  Alvira Monday, FNP   Chief Complaint:    History of Present Illness:    Jacqueline [name] is a 46 y.o. female with morning headaches, snoring, and feeling fatigue during the daytime.  She  has gained weight in the past few weeks and has had difficulty sleeping at nighttime.  She reports that she sleeps better in the upright position.  She voices that her symptoms of nasal congestion, cough, and nasal pressure has improved.     The patient does not have symptoms concerning for COVID-19 infection (fever, chills, cough, or new shortness of breath).   Past Medical, Surgical, Social History, Allergies, and Medications have been Reviewed.  Past Medical History:  Diagnosis Date   Annual visit for general adult medical examination with abnormal findings 02/22/2020   Depression, major, single episode, moderate ([place]) 12/29/2019   Encounter for TB tine test 02/22/2020   GERD (gastroesophageal reflux disease)    Kidney stones    hx UTI   Need for immunization against influenza 12/29/2019   Sinusitis    URI (upper respiratory infection)    UTI (lower urinary tract infection)    Past Surgical  History:  Procedure Laterality Date   DILATION AND CURETTAGE OF UTERUS     TUBAL LIGATION       Current Meds  Medication Sig   busPIRone (BUSPAR) 7.5 MG tablet Take 1 tablet (7.5 mg total) by mouth 2 (two) times daily.   gabapentin (NEURONTIN) 300 MG capsule Take 300 mg by mouth 3 (three) times daily as needed.   lamoTRIgine (LAMICTAL) 100 MG tablet Take 1 tablet (100 mg total) by mouth daily.   sodium chloride (OCEAN) 0.65 % SOLN nasal spray Place 1 spray into both nostrils as needed for congestion.     Allergies:   Penicillins   ROS:   Please see the history of present illness.     All other systems reviewed and are negative.   Labs/Other Tests and Data Reviewed:    Recent Labs: 11/18/2021: ALT 15; BUN 8; Creatinine, Ser 0.70; Potassium 4.6; Sodium 139; TSH 1.480 06/12/2022: Hemoglobin 14.9; Platelets 365   Recent Lipid Panel Lab Results  Component Value Date/Time   CHOL 213 (H) 06/12/2022 09:23 AM   TRIG 117 06/12/2022 09:23 AM   HDL 41 06/12/2022 09:23 AM   CHOLHDL 5.2 (H) 06/12/2022 09:23 AM   CHOLHDL 4.2 02/23/2020 08:03 AM   LDLCALC 151 (H) 06/12/2022 09:23 AM   LDLCALC 128 (H) 02/23/2020 08:03 AM    Wt Readings from Last 3 Encounters:  10/03/22 179 lb 1.9 oz (81.2 kg)  08/12/22 175 lb (79.4 kg)  06/24/22 177 lb 6.4 oz (80.5 kg)  Objective:    Vital Signs:  There were no vitals taken for this visit.     ASSESSMENT & PLAN:   Sleep disturbance She declines home sleep study at this time, reporting that she will work on lifestyle modification with increasing physical activities to maintain a normal BMI We will reassess the patient at her followup visit  Time:   Today, I have spent 12 minutes reviewing the chart, including problem list, medications, and with the patient with telehealth technology discussing the above problems.   Medication Adjustments/Labs and Tests Ordered: Current medicines are reviewed at length with the patient today.  Concerns  regarding medicines are outlined above.   Tests Ordered: No orders of the defined types were placed in this encounter.   Medication Changes: No orders of the defined types were placed in this encounter.    Note: This dictation was prepared with Dragon dictation along with smaller phrase technology. Similar sounding words can be transcribed inadequately or may not be corrected upon review. Any transcriptional errors that result from this process are unintentional.      Disposition:  Follow up  Signed, Gilmore Laroche, FNP  11/05/2022 7:41 AM     [facility]

## 2022-11-15 DIAGNOSIS — F4381 Prolonged grief disorder: Secondary | ICD-10-CM | POA: Diagnosis not present

## 2022-11-15 DIAGNOSIS — F4312 Post-traumatic stress disorder, chronic: Secondary | ICD-10-CM | POA: Diagnosis not present

## 2022-11-15 DIAGNOSIS — F341 Dysthymic disorder: Secondary | ICD-10-CM | POA: Diagnosis not present

## 2022-11-15 DIAGNOSIS — F411 Generalized anxiety disorder: Secondary | ICD-10-CM | POA: Diagnosis not present

## 2022-11-16 ENCOUNTER — Inpatient Hospital Stay: Admission: RE | Admit: 2022-11-16 | Payer: Self-pay | Source: Ambulatory Visit

## 2022-11-17 ENCOUNTER — Encounter: Payer: Self-pay | Admitting: Family Medicine

## 2022-11-17 ENCOUNTER — Ambulatory Visit (INDEPENDENT_AMBULATORY_CARE_PROVIDER_SITE_OTHER): Payer: Medicaid Other | Admitting: Family Medicine

## 2022-11-17 DIAGNOSIS — R0981 Nasal congestion: Secondary | ICD-10-CM | POA: Diagnosis not present

## 2022-11-17 DIAGNOSIS — F4312 Post-traumatic stress disorder, chronic: Secondary | ICD-10-CM | POA: Diagnosis not present

## 2022-11-17 DIAGNOSIS — F411 Generalized anxiety disorder: Secondary | ICD-10-CM | POA: Diagnosis not present

## 2022-11-17 DIAGNOSIS — F341 Dysthymic disorder: Secondary | ICD-10-CM | POA: Diagnosis not present

## 2022-11-17 DIAGNOSIS — F4381 Prolonged grief disorder: Secondary | ICD-10-CM | POA: Diagnosis not present

## 2022-11-17 MED ORDER — AZELASTINE HCL 0.1 % NA SOLN: 2.0000 | Freq: Two times a day (BID) | NASAL | 12 refills | Status: AC

## 2022-11-17 NOTE — Progress Notes (Signed)
Virtual Visit via Telephone Note   This visit type was conducted via telephone. This format is felt to be most appropriate for this patient at this time.  The patient did not have access to video technology/had technical difficulties with video requiring transitioning to audio format only (telephone).  All issues noted in this document were discussed and addressed.  No physical exam could be performed with this format.  Evaluation Performed:  Follow-up visit  Date:  11/17/2022   ID:  Jacqueline [name], DOB 11-19-1976, MRN [id number]  Patient Location: Home Provider Location: Clinic  Participants: Patient Location of Patient: Home Location of Provider: clinic Consent was obtain for visit to be over via telehealth. I verified that I am speaking with the correct person using two identifiers.  PCP:  Gilmore Laroche, FNP   Chief Complaint:  nasal congestions  History of Present Illness:    Jacqueline [name] is a 46 y.o. female with complaints of nasal congestion since 11/12/2022.  She reports that she has been using Flonase nasal spray with minimal relief of her symptoms.  She denies body aches, sore throat, fever, chills and reports having maxillary sinus pressure and sinus headache.   The patient does not have symptoms concerning for COVID-19 infection (fever, chills, cough, or new shortness of breath).   Past Medical, Surgical, Social History, Allergies, and Medications have been Reviewed.  Past Medical History:  Diagnosis Date   Annual visit for general adult medical examination with abnormal findings 02/22/2020   Depression, major, single episode, moderate ([facility]) 12/29/2019   Encounter for TB tine test 02/22/2020   GERD (gastroesophageal reflux disease)    Kidney stones    hx UTI   Need for immunization against influenza 12/29/2019   Sinusitis    URI (upper respiratory infection)    UTI (lower urinary tract infection)    Past Surgical History:  Procedure Laterality Date    DILATION AND CURETTAGE OF UTERUS     TUBAL LIGATION       Current Meds  Medication Sig   azelastine (ASTELIN) 0.1 % nasal spray Place 2 sprays into both nostrils 2 (two) times daily. Use in each nostril as directed   busPIRone (BUSPAR) 7.5 MG tablet Take 1 tablet (7.5 mg total) by mouth 2 (two) times daily.   gabapentin (NEURONTIN) 300 MG capsule Take 300 mg by mouth 3 (three) times daily as needed.   lamoTRIgine (LAMICTAL) 100 MG tablet Take 1 tablet (100 mg total) by mouth daily.   sodium chloride (OCEAN) 0.65 % SOLN nasal spray Place 1 spray into both nostrils as needed for congestion.     Allergies:   Penicillins   ROS:   Please see the history of present illness.     All other systems reviewed and are negative.   Labs/Other Tests and Data Reviewed:    Recent Labs: 11/18/2021: ALT 15; BUN 8; Creatinine, Ser 0.70; Potassium 4.6; Sodium 139; TSH 1.480 06/12/2022: Hemoglobin 14.9; Platelets 365   Recent Lipid Panel Lab Results  Component Value Date/Time   CHOL 213 (H) 06/12/2022 09:23 AM   TRIG 117 06/12/2022 09:23 AM   HDL 41 06/12/2022 09:23 AM   CHOLHDL 5.2 (H) 06/12/2022 09:23 AM   CHOLHDL 4.2 02/23/2020 08:03 AM   LDLCALC 151 (H) 06/12/2022 09:23 AM   LDLCALC 128 (H) 02/23/2020 08:03 AM    Wt Readings from Last 3 Encounters:  10/03/22 179 lb 1.9 oz (81.2 kg)  08/12/22 175 lb (79.4 kg)  06/24/22 177 lb 6.4 oz (80.5 kg)     Objective:    Vital Signs:  There were no vitals taken for this visit.     ASSESSMENT & PLAN:   Nasal congestion Encouraged to take Tylenol as needed for sinus headache Will prescribe azelastine nasal spray Encouraged use of humidifier at home to thin and loosen mucus  Time:   Today, I have spent 12 minutes reviewing the chart, including problem list, medications, and with the patient with telehealth technology discussing the above problems.   Medication Adjustments/Labs and Tests Ordered: Current medicines are reviewed at length  with the patient today.  Concerns regarding medicines are outlined above.   Tests Ordered: No orders of the defined types were placed in this encounter.   Medication Changes: Meds ordered this encounter  Medications   azelastine (ASTELIN) 0.1 % nasal spray    Sig: Place 2 sprays into both nostrils 2 (two) times daily. Use in each nostril as directed    Dispense:  30 mL    Refill:  12     Note: This dictation was prepared with Dragon dictation along with smaller phrase technology. Similar sounding words can be transcribed inadequately or may not be corrected upon review. Any transcriptional errors that result from this process are unintentional.      Disposition:  Follow up  Signed, Gilmore Laroche, FNP  11/17/2022 11:34 AM     [facility]

## 2022-11-19 DIAGNOSIS — J019 Acute sinusitis, unspecified: Secondary | ICD-10-CM | POA: Diagnosis not present

## 2022-11-19 DIAGNOSIS — Z6836 Body mass index (BMI) 36.0-36.9, adult: Secondary | ICD-10-CM | POA: Diagnosis not present

## 2022-11-19 DIAGNOSIS — R030 Elevated blood-pressure reading, without diagnosis of hypertension: Secondary | ICD-10-CM | POA: Diagnosis not present

## 2022-11-19 DIAGNOSIS — E669 Obesity, unspecified: Secondary | ICD-10-CM | POA: Diagnosis not present

## 2022-11-24 DIAGNOSIS — F4312 Post-traumatic stress disorder, chronic: Secondary | ICD-10-CM | POA: Diagnosis not present

## 2022-11-24 DIAGNOSIS — F418 Other specified anxiety disorders: Secondary | ICD-10-CM | POA: Diagnosis not present

## 2022-11-24 DIAGNOSIS — F411 Generalized anxiety disorder: Secondary | ICD-10-CM | POA: Diagnosis not present

## 2022-11-24 DIAGNOSIS — F341 Dysthymic disorder: Secondary | ICD-10-CM | POA: Diagnosis not present

## 2022-11-27 DIAGNOSIS — E669 Obesity, unspecified: Secondary | ICD-10-CM | POA: Diagnosis not present

## 2022-11-27 DIAGNOSIS — M25571 Pain in right ankle and joints of right foot: Secondary | ICD-10-CM | POA: Diagnosis not present

## 2022-11-27 DIAGNOSIS — Z6833 Body mass index (BMI) 33.0-33.9, adult: Secondary | ICD-10-CM | POA: Diagnosis not present

## 2022-11-27 DIAGNOSIS — R030 Elevated blood-pressure reading, without diagnosis of hypertension: Secondary | ICD-10-CM | POA: Diagnosis not present

## 2022-12-01 DIAGNOSIS — F4381 Prolonged grief disorder: Secondary | ICD-10-CM | POA: Diagnosis not present

## 2022-12-01 DIAGNOSIS — F4312 Post-traumatic stress disorder, chronic: Secondary | ICD-10-CM | POA: Diagnosis not present

## 2022-12-01 DIAGNOSIS — F411 Generalized anxiety disorder: Secondary | ICD-10-CM | POA: Diagnosis not present

## 2022-12-01 DIAGNOSIS — F341 Dysthymic disorder: Secondary | ICD-10-CM | POA: Diagnosis not present

## 2022-12-08 DIAGNOSIS — F341 Dysthymic disorder: Secondary | ICD-10-CM | POA: Diagnosis not present

## 2022-12-08 DIAGNOSIS — F4312 Post-traumatic stress disorder, chronic: Secondary | ICD-10-CM | POA: Diagnosis not present

## 2022-12-08 DIAGNOSIS — F4381 Prolonged grief disorder: Secondary | ICD-10-CM | POA: Diagnosis not present

## 2022-12-08 DIAGNOSIS — F411 Generalized anxiety disorder: Secondary | ICD-10-CM | POA: Diagnosis not present

## 2022-12-15 DIAGNOSIS — F411 Generalized anxiety disorder: Secondary | ICD-10-CM | POA: Diagnosis not present

## 2022-12-15 DIAGNOSIS — F341 Dysthymic disorder: Secondary | ICD-10-CM | POA: Diagnosis not present

## 2022-12-15 DIAGNOSIS — F4381 Prolonged grief disorder: Secondary | ICD-10-CM | POA: Diagnosis not present

## 2022-12-15 DIAGNOSIS — F4312 Post-traumatic stress disorder, chronic: Secondary | ICD-10-CM | POA: Diagnosis not present

## 2023-01-05 ENCOUNTER — Ambulatory Visit: Payer: Medicaid Other | Admitting: Family Medicine

## 2023-01-19 DIAGNOSIS — E559 Vitamin D deficiency, unspecified: Secondary | ICD-10-CM | POA: Diagnosis not present

## 2023-01-19 DIAGNOSIS — R7301 Impaired fasting glucose: Secondary | ICD-10-CM | POA: Diagnosis not present

## 2023-01-19 DIAGNOSIS — E038 Other specified hypothyroidism: Secondary | ICD-10-CM | POA: Diagnosis not present

## 2023-01-20 LAB — CMP14+EGFR
ALT: 17 IU/L (ref 0–32)
AST: 16 IU/L (ref 0–40)
Albumin/Globulin Ratio: 1.7 (ref 1.2–2.2)
Albumin: 4.5 g/dL (ref 3.9–4.9)
Alkaline Phosphatase: 86 IU/L (ref 44–121)
BUN/Creatinine Ratio: 15 (ref 9–23)
BUN: 10 mg/dL (ref 6–24)
Bilirubin Total: 0.5 mg/dL (ref 0.0–1.2)
CO2: 23 mmol/L (ref 20–29)
Calcium: 9.8 mg/dL (ref 8.7–10.2)
Chloride: 102 mmol/L (ref 96–106)
Creatinine, Ser: 0.66 mg/dL (ref 0.57–1.00)
Globulin, Total: 2.6 g/dL (ref 1.5–4.5)
Glucose: 86 mg/dL (ref 70–99)
Potassium: 4.4 mmol/L (ref 3.5–5.2)
Sodium: 142 mmol/L (ref 134–144)
Total Protein: 7.1 g/dL (ref 6.0–8.5)
eGFR: 111 mL/min/{1.73_m2} (ref >59)

## 2023-01-20 LAB — CBC WITH DIFFERENTIAL/PLATELET
Basophils Absolute: 0.1 10*3/uL (ref 0.0–0.2)
Basos: 1 %
EOS (ABSOLUTE): 0.2 10*3/uL (ref 0.0–0.4)
Eos: 2 %
Hematocrit: 45.8 % (ref 34.0–46.6)
Hemoglobin: 14.9 g/dL (ref 11.1–15.9)
Immature Grans (Abs): 0 10*3/uL (ref 0.0–0.1)
Immature Granulocytes: 0 %
Lymphocytes Absolute: 3.2 10*3/uL — ABNORMAL HIGH (ref 0.7–3.1)
Lymphs: 30 %
MCH: 27 pg (ref 26.6–33.0)
MCHC: 32.5 g/dL (ref 31.5–35.7)
MCV: 83 fL (ref 79–97)
Monocytes Absolute: 0.7 10*3/uL (ref 0.1–0.9)
Monocytes: 7 %
Neutrophils Absolute: 6.6 10*3/uL (ref 1.4–7.0)
Neutrophils: 60 %
Platelets: 408 10*3/uL (ref 150–450)
RBC: 5.52 x10E6/uL — ABNORMAL HIGH (ref 3.77–5.28)
RDW: 13.4 % (ref 11.7–15.4)
WBC: 10.8 10*3/uL (ref 3.4–10.8)

## 2023-01-20 LAB — LIPID PANEL
Chol/HDL Ratio: 4.3 ratio (ref 0.0–4.4)
Cholesterol, Total: 189 mg/dL (ref 100–199)
HDL: 44 mg/dL (ref >39)
LDL Chol Calc (NIH): 121 mg/dL — ABNORMAL HIGH (ref 0–99)
Triglycerides: 133 mg/dL (ref 0–149)
VLDL Cholesterol Cal: 24 mg/dL (ref 5–40)

## 2023-01-20 LAB — TSH+FREE T4
Free T4: 1.22 ng/dL (ref 0.82–1.77)
TSH: 2.06 u[IU]/mL (ref 0.450–4.500)

## 2023-01-20 LAB — VITAMIN D 25 HYDROXY (VIT D DEFICIENCY, FRACTURES): Vit D, 25-Hydroxy: 14.1 ng/mL — ABNORMAL LOW (ref 30.0–100.0)

## 2023-01-20 LAB — HEMOGLOBIN A1C
Est. average glucose Bld gHb Est-mCnc: 123 mg/dL
Hgb A1c MFr Bld: 5.9 % — ABNORMAL HIGH (ref 4.8–5.6)

## 2023-01-21 ENCOUNTER — Encounter: Payer: Self-pay | Admitting: Family Medicine

## 2023-01-22 ENCOUNTER — Other Ambulatory Visit: Payer: Self-pay | Admitting: Family Medicine

## 2023-01-22 DIAGNOSIS — E559 Vitamin D deficiency, unspecified: Secondary | ICD-10-CM

## 2023-01-22 MED ORDER — VITAMIN D (ERGOCALCIFEROL) 1.25 MG (50000 UNIT) PO CAPS: 50000.0000 [IU] | ORAL_CAPSULE | ORAL | 1 refills | Status: DC

## 2023-01-26 DIAGNOSIS — F4312 Post-traumatic stress disorder, chronic: Secondary | ICD-10-CM | POA: Diagnosis not present

## 2023-01-26 DIAGNOSIS — F4381 Prolonged grief disorder: Secondary | ICD-10-CM | POA: Diagnosis not present

## 2023-01-26 DIAGNOSIS — F411 Generalized anxiety disorder: Secondary | ICD-10-CM | POA: Diagnosis not present

## 2023-01-26 DIAGNOSIS — F341 Dysthymic disorder: Secondary | ICD-10-CM | POA: Diagnosis not present

## 2023-02-02 DIAGNOSIS — E119 Type 2 diabetes mellitus without complications: Secondary | ICD-10-CM | POA: Diagnosis not present

## 2023-02-02 DIAGNOSIS — R32 Unspecified urinary incontinence: Secondary | ICD-10-CM | POA: Diagnosis not present

## 2023-02-06 ENCOUNTER — Ambulatory Visit (INDEPENDENT_AMBULATORY_CARE_PROVIDER_SITE_OTHER): Payer: Medicaid Other | Admitting: Obstetrics and Gynecology

## 2023-02-06 ENCOUNTER — Other Ambulatory Visit (HOSPITAL_COMMUNITY)
Admission: RE | Admit: 2023-02-06 | Discharge: 2023-02-06 | Disposition: A | Discharge status: Home / Self Care | Payer: Medicaid Other | Source: Ambulatory Visit | Attending: Obstetrics and Gynecology | Admitting: Obstetrics and Gynecology

## 2023-02-06 ENCOUNTER — Encounter: Payer: Self-pay | Admitting: Obstetrics and Gynecology

## 2023-02-06 VITALS — BP 143/92 | HR 93 | Ht 60.75 in | Wt 197.8 lb

## 2023-02-06 DIAGNOSIS — Z01419 Encounter for gynecological examination (general) (routine) without abnormal findings: Secondary | ICD-10-CM | POA: Insufficient documentation

## 2023-02-06 NOTE — Progress Notes (Signed)
Jacqueline [name] is a 47 y.o. G37P3003 female here for a routine annual gynecologic exam.  Current complaints: Menopausal Sx.   Denies abnormal vaginal bleeding, discharge, pelvic pain, not sexual active or other gynecologic concerns.    Has problems with hot flashes, night sweats and insomnia. LMP was 2 yrs ago   Gynecologic History Patient's last menstrual period was 07/24/2022. Contraception: post menopausal status Last Pap: 2019. Results were: normal Last mammogram: 3/23. Results were: normal  Obstetric History OB History  Gravida Para Term Preterm AB Living  3 3 3     3  $ SAB IAB Ectopic Multiple Live Births               # Outcome Date GA Lbr Len/2nd Weight Sex Delivery Anes PTL Lv  3 Term           2 Term           1 Term             Past Medical History:  Diagnosis Date   Annual visit for general adult medical examination with abnormal findings 02/22/2020   Depression, major, single episode, moderate ([place]) 12/29/2019   Encounter for TB tine test 02/22/2020   GERD (gastroesophageal reflux disease)    Kidney stones    hx UTI   Need for immunization against influenza 12/29/2019   Sinusitis    URI (upper respiratory infection)    UTI (lower urinary tract infection)     Past Surgical History:  Procedure Laterality Date   DILATION AND CURETTAGE OF UTERUS     TUBAL LIGATION      Current Outpatient Medications on File Prior to Visit  Medication Sig Dispense Refill   azelastine (ASTELIN) 0.1 % nasal spray Place 2 sprays into both nostrils 2 (two) times daily. Use in each nostril as directed 30 mL 12   busPIRone (BUSPAR) 7.5 MG tablet Take 1 tablet (7.5 mg total) by mouth 2 (two) times daily. (Patient taking differently: Take 10 mg by mouth 2 (two) times daily.) 60 tablet 1   gabapentin (NEURONTIN) 300 MG capsule Take 300 mg by mouth 3 (three) times daily as needed.     hydrOXYzine (ATARAX) 50 MG tablet Take 50-100 mg by mouth 2 (two) times daily as needed.     lamoTRIgine  (LAMICTAL) 100 MG tablet Take 1 tablet (100 mg total) by mouth daily. 30 tablet 1   sodium chloride (OCEAN) 0.65 % SOLN nasal spray Place 1 spray into both nostrils as needed for congestion. 15 mL 0   Vitamin D, Ergocalciferol, (DRISDOL) 1.25 MG (50000 UNIT) CAPS capsule Take 1 capsule (50,000 Units total) by mouth every 7 (seven) days. 10 capsule 1   No current facility-administered medications on file prior to visit.    Allergies  Allergen Reactions   Penicillins Nausea And Vomiting and Rash    Social History   Socioeconomic History   Marital status: Single    Spouse name: Not on file   Number of children: 1   Years of education: Not on file   Highest education level: GED or equivalent  Occupational History   Not on file  Tobacco Use   Smoking status: Every Day    Packs/day: 0.50    Years: 5.00    Total pack years: 2.50    Types: Cigarettes    Last attempt to quit: 05/08/2010    Years since quitting: 12.7   Smokeless tobacco: Never  Vaping Use   Vaping Use:  Never used  Substance and Sexual Activity   Alcohol use: No   Drug use: No   Sexual activity: Not Currently    Birth control/protection: Surgical, Post-menopausal  Other Topics Concern   Not on file  Social History Narrative   Lives with son Cornelia Copa is 41 years old.   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    Social Determinants of Health   Financial Resource Strain: Medium Risk (02/06/2023)   Overall Financial Resource Strain (CARDIA)    Difficulty of Paying Living Expenses: Somewhat hard  Food Insecurity: No Food Insecurity (02/06/2023)   Hunger Vital Sign    Worried About Running Out of Food in the Last Year: Never true    [place] in the  Last Year: Never true  Transportation Needs: No Transportation Needs (02/06/2023)   PRAPARE - Hydrologist (Medical): No    Lack of Transportation (Non-Medical): No  Physical Activity: Sufficiently Active (02/06/2023)   Exercise Vital Sign    Days of Exercise per Week: 3 days    Minutes of Exercise per Session: 60 min  Stress: Stress Concern Present (02/06/2023)   [place]    Feeling of Stress : Very much  Social Connections: Moderately Integrated (02/06/2023)   Social Connection and Isolation Panel [NHANES]    Frequency of Communication with Friends and Family: Twice a week    Frequency of Social Gatherings with Friends and Family: Once a week    Attends Religious Services: More than 4 times per year    Active Member of [employer] or Organizations: Yes    Attends Music therapist: More than 4 times per year    Marital Status: Divorced  Intimate Partner Violence: Not At Risk (02/06/2023)   Humiliation, Afraid, Rape, and Kick questionnaire    Fear of Current or Ex-Partner: No    Emotionally Abused: No    Physically Abused: No    Sexually Abused: No    Family History  Problem Relation Age of Onset   Diabetes Other    Heart disease Mother    Diabetes Maternal Aunt    Cancer Paternal Aunt        lymph node, breast, and ovarian   Breast cancer Paternal Aunt    Diabetes Maternal Grandmother    Heart disease Maternal Grandmother    Heart failure Maternal Grandmother    Heart attack Maternal Grandfather    Cancer Paternal Grandmother        breast cancer   Breast cancer Paternal Grandmother    Cancer Paternal Grandfather        lung cancer    The following portions of the patient's history were reviewed and updated as appropriate: allergies, current medications, past family history, past medical history, past social history, past surgical history and problem list.  Review of  Systems Pertinent items noted in HPI and remainder of comprehensive ROS otherwise negative.   Objective:  BP (!) 143/92 (BP Location: Right Arm, Patient Position: Sitting, Cuff Size: Normal)   Pulse 93  Ht 5' 0.75" (1.543 m)   Wt 197 lb 12.8 oz (89.7 kg)   LMP 07/24/2022   BMI 37.68 kg/m  Chaperone present CONSTITUTIONAL: Well-developed, well-nourished female in no acute distress.  HENT:  Normocephalic, atraumatic, External right and left ear normal. Oropharynx is clear and moist EYES: Conjunctivae and EOM are normal. Pupils are equal, round, and reactive to light. No scleral icterus.  NECK: Normal range of motion, supple, no masses.  Normal thyroid.  SKIN: Skin is warm and dry. No rash noted. Not diaphoretic. No erythema. No pallor. [place]: Alert and oriented to person, place, and time. Normal reflexes, muscle tone coordination. No cranial nerve deficit noted. PSYCHIATRIC: Normal mood and affect. Normal behavior. Normal judgment and thought content. CARDIOVASCULAR: Normal heart rate noted, regular rhythm RESPIRATORY: Clear to auscultation bilaterally. Effort and breath sounds normal, no problems with respiration noted. BREASTS: Symmetric in size. No masses, skin changes, nipple drainage, or lymphadenopathy. ABDOMEN: Soft, normal bowel sounds, no distention noted.  No tenderness, rebound or guarding.  PELVIC: Normal appearing external genitalia; normal appearing vaginal mucosa and cervix.  No abnormal discharge noted.  Pap smear obtained.  Normal uterine size, no other palpable masses, no uterine or adnexal tenderness. MUSCULOSKELETAL: Normal range of motion. No tenderness.  No cyanosis, clubbing, or edema.  2+ distal pulses.   Assessment:  Annual gynecologic examination with pap smear Menopausal Sx Plan:  Will follow up results of pap smear and manage accordingly. Mammogram scheduled by PCP Discussed menopausal Tx options. Pt is a smoker, so not a candidate for HRT. Pt already  taking non hormonal medications that are used to treat menopausal Sx. Pt advised to discussed increasing these medications or other meds. Discussed sleep hygiene with pt as well. Do not feel that checking hormonal levels would be beneficial to the pt as she is not a candidate for HRT, as noted above.   Routine preventative health maintenance measures emphasized. Please refer to After Visit Summary for other counseling recommendations.    Chancy Milroy, MD, [place] Attending [place] for [facility], [place]

## 2023-02-09 ENCOUNTER — Ambulatory Visit: Payer: Medicaid Other | Admitting: Family Medicine

## 2023-02-09 ENCOUNTER — Encounter: Payer: Self-pay | Admitting: Family Medicine

## 2023-02-11 LAB — CYTOLOGY - PAP
Comment: NEGATIVE
Comment: NEGATIVE
Comment: NEGATIVE
Diagnosis: HIGH — AB
HPV 16: NEGATIVE
HPV 18 / 45: NEGATIVE
High risk HPV: POSITIVE — AB

## 2023-02-12 ENCOUNTER — Encounter: Payer: Self-pay | Admitting: Obstetrics and Gynecology

## 2023-02-12 DIAGNOSIS — R87613 High grade squamous intraepithelial lesion on cytologic smear of cervix (HGSIL): Secondary | ICD-10-CM | POA: Insufficient documentation

## 2023-02-13 ENCOUNTER — Ambulatory Visit (INDEPENDENT_AMBULATORY_CARE_PROVIDER_SITE_OTHER): Payer: Medicaid Other | Admitting: Obstetrics & Gynecology

## 2023-02-13 ENCOUNTER — Encounter: Payer: Self-pay | Admitting: Obstetrics & Gynecology

## 2023-02-13 ENCOUNTER — Other Ambulatory Visit (HOSPITAL_COMMUNITY)
Admission: RE | Admit: 2023-02-13 | Discharge: 2023-02-13 | Disposition: A | Discharge status: Home / Self Care | Payer: Medicaid Other | Source: Ambulatory Visit | Attending: Obstetrics & Gynecology | Admitting: Obstetrics & Gynecology

## 2023-02-13 VITALS — BP 149/92 | HR 93 | Ht 60.0 in | Wt 199.4 lb

## 2023-02-13 DIAGNOSIS — R87618 Other abnormal cytological findings on specimens from cervix uteri: Secondary | ICD-10-CM | POA: Diagnosis not present

## 2023-02-13 DIAGNOSIS — R87613 High grade squamous intraepithelial lesion on cytologic smear of cervix (HGSIL): Secondary | ICD-10-CM | POA: Insufficient documentation

## 2023-02-13 DIAGNOSIS — F419 Anxiety disorder, unspecified: Secondary | ICD-10-CM

## 2023-02-13 DIAGNOSIS — D060 Carcinoma in situ of endocervix: Secondary | ICD-10-CM | POA: Diagnosis not present

## 2023-02-13 DIAGNOSIS — Z720 Tobacco use: Secondary | ICD-10-CM | POA: Diagnosis not present

## 2023-02-13 NOTE — Progress Notes (Signed)
Patient name: Jacqueline [name] MRN [id number]  Date of birth: [birth year] Chief Complaint:   Colposcopy  History of Present Illness:   Jacqueline [name] is a 47 y.o. G78P3003 female being seen today for cervical dysplasia management.  Recent pap: HSIL, HPV+  Smoker:  Yes.   New sexual partner:  No.  Fiance died in 20-Mar-2020 and has not been sexually active since  History of abnormal Pap: no  Reports that it has been over a year since her period. Denies any irregular bleeding, discharge, itching or irritation.  Denies pelvic or abdominal pain.  Patient's last menstrual period was 07/24/2022.     02/06/2023   10:43 AM 11/17/2022   10:39 AM 11/04/2022   10:01 AM 10/23/2022    5:08 PM 10/03/2022    4:10 PM  Depression screen PHQ 2/9  Decreased Interest 1 0 0  0  Down, Depressed, Hopeless 2 0 0  1  PHQ - 2 Score 3 0 0  1  Altered sleeping 3 0 0  3  Tired, decreased energy 3 0 0  3  Change in appetite 3 0 0  3  Feeling bad or failure about yourself  3 0 0  2  Trouble concentrating 2 0 0  1  Moving slowly or fidgety/restless 0 0 0  1  Suicidal thoughts 0 0 0  0  PHQ-9 Score 17 0 0  14  Difficult doing work/chores  Not difficult at all        Information is confidential and restricted. Go to Review Flowsheets to unlock data.     Review of Systems:   Pertinent items are noted in HPI Denies fever/chills, dizziness, headaches, visual disturbances, fatigue, shortness of breath, chest pain, abdominal pain, vomiting, no problems with bowel movements, urination, or intercourse unless otherwise stated above.  Pertinent History Reviewed:  Reviewed past medical,surgical, social, obstetrical and family history.  Reviewed problem list, medications and allergies. Physical Assessment:   Vitals:   02/13/23 0954 02/13/23 1018  BP: (!) 150/101 (!) 149/92  Pulse: 93   Weight: 199 lb 6.4 oz (90.4 kg)   Height: 5' (1.524 m)   Body mass index is 38.94 kg/m.       Physical Examination:   General  appearance: alert, well appearing, and in no distress  Psych: mood appropriate, normal affect  Skin: warm & dry   Cardiovascular: normal heart rate noted  Respiratory: normal respiratory effort, no distress  Abdomen: soft, non-tender   Pelvic: VULVA: normal appearing vulva with no masses, tenderness or lesions, VAGINA: normal appearing vagina with normal color and discharge, no lesions, CERVIX: see colposcopy section  Extremities: no edema   Chaperone: Jacqueline [name]     Colposcopy Procedure Note  Indications: HSIL, HPV+  Procedure Details  The risks and benefits of the procedure and Written informed consent obtained.  Speculum placed in vagina and excellent visualization of cervix achieved, cervix swabbed x 3 with acetic acid solution.  Findings: Adequate colposcopy is noted today.  TMZ zone present  Cervix: acetowhite lesion(s) noted 12 o'clock and abnormal vessels noted at 6 o'clock; ECC and cervical biopsies obtained.    Monsel's applied.  Adequate hemostasis noted  Specimens: ECC and cervical biopsies obtained  Complications: none.  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  PT given information for [place] Quitline  Janyth Pupa, DO Attending [place], [facility] for [employer], [place]

## 2023-02-16 LAB — SURGICAL PATHOLOGY

## 2023-02-17 ENCOUNTER — Encounter: Payer: Self-pay | Admitting: Obstetrics & Gynecology

## 2023-02-18 ENCOUNTER — Telehealth: Payer: Self-pay | Admitting: *Deleted

## 2023-02-18 NOTE — Telephone Encounter (Signed)
Spoke to patient regarding biopsy results.  Pt very anxious and awaiting Dr Annie Main call.  Informed she was out of the office until next week.  Informed results of biopsy were abnormal and she would most likely need laser surgery at hospital but all of that would be discussed when Dr Nelda Marseille called to review results.  Pt verbalized understanding with no further questions.

## 2023-02-24 ENCOUNTER — Ambulatory Visit (INDEPENDENT_AMBULATORY_CARE_PROVIDER_SITE_OTHER): Payer: Medicaid Other | Admitting: Obstetrics & Gynecology

## 2023-02-24 ENCOUNTER — Encounter: Payer: Self-pay | Admitting: Obstetrics & Gynecology

## 2023-02-24 VITALS — BP 142/98 | HR 106 | Ht 63.75 in | Wt 200.0 lb

## 2023-02-24 DIAGNOSIS — D069 Carcinoma in situ of cervix, unspecified: Secondary | ICD-10-CM

## 2023-02-24 DIAGNOSIS — F419 Anxiety disorder, unspecified: Secondary | ICD-10-CM

## 2023-02-24 NOTE — Progress Notes (Signed)
   GYN VISIT Patient name: Jacqueline [name] MRN [id number]  Date of birth: 1976/02/10 Chief Complaint:   Follow-up  History of Present Illness:   Jacqueline [name] is a 47 y.o. 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  Tubal ligation for contraception  Patient's last menstrual period was 07/24/2022.     02/06/2023   10:43 AM 11/17/2022   10:39 AM 11/04/2022   10:01 AM 10/23/2022    5:08 PM 10/03/2022    4:10 PM  Depression screen PHQ 2/9  Decreased Interest 1 0 0  0  Down, Depressed, Hopeless 2 0 0  1  PHQ - 2 Score 3 0 0  1  Altered sleeping 3 0 0  3  Tired, decreased energy 3 0 0  3  Change in appetite 3 0 0  3  Feeling bad or failure about yourself  3 0 0  2  Trouble concentrating 2 0 0  1  Moving slowly or fidgety/restless 0 0 0  1  Suicidal thoughts 0 0 0  0  PHQ-9 Score 17 0 0  14  Difficult doing work/chores  Not difficult at all        Information is confidential and restricted. Go to Review Flowsheets to unlock data.     Review of Systems:   Pertinent items are noted in HPI Denies fever/chills, dizziness, headaches, visual disturbances, fatigue, shortness of breath, chest pain, abdominal pain, vomiting, no problems with periods, bowel movements, urination, or intercourse unless otherwise stated above.  Pertinent History Reviewed:  Reviewed past medical,surgical, social, obstetrical and family history.  Reviewed problem list, medications and allergies. Physical Assessment:   Vitals:   02/24/23 1405  BP: (!) 142/98  Pulse: (!) 106  Weight: 200 lb (90.7 kg)  Height: 5' 3.75" (1.619 m)  Body mass index is 34.6 kg/m.       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  Discussed postop care and follow up REviewed hospital expectations Questions/concerns were addressed, desires to proceed at next available. Surgical referral created   Janyth Pupa, DO Attending [place], [facility] for [facility], [place]

## 2023-02-25 ENCOUNTER — Encounter: Payer: Self-pay | Admitting: Obstetrics & Gynecology

## 2023-03-06 ENCOUNTER — Encounter: Payer: Self-pay | Admitting: Family Medicine

## 2023-03-06 ENCOUNTER — Ambulatory Visit (INDEPENDENT_AMBULATORY_CARE_PROVIDER_SITE_OTHER): Payer: Medicaid Other | Admitting: Family Medicine

## 2023-03-06 VITALS — BP 138/82 | HR 96 | Ht 60.0 in | Wt 203.1 lb

## 2023-03-06 DIAGNOSIS — J328 Other chronic sinusitis: Secondary | ICD-10-CM

## 2023-03-06 DIAGNOSIS — J329 Chronic sinusitis, unspecified: Secondary | ICD-10-CM | POA: Insufficient documentation

## 2023-03-06 DIAGNOSIS — M79671 Pain in right foot: Secondary | ICD-10-CM

## 2023-03-06 DIAGNOSIS — G8929 Other chronic pain: Secondary | ICD-10-CM | POA: Diagnosis not present

## 2023-03-06 DIAGNOSIS — F410 Panic disorder [episodic paroxysmal anxiety] without agoraphobia: Secondary | ICD-10-CM | POA: Diagnosis not present

## 2023-03-06 DIAGNOSIS — F411 Generalized anxiety disorder: Secondary | ICD-10-CM | POA: Diagnosis not present

## 2023-03-06 MED ORDER — NAPROXEN 500 MG PO TABS: 500.0000 mg | ORAL_TABLET | Freq: Two times a day (BID) | ORAL | 0 refills | Status: AC

## 2023-03-06 MED ORDER — HYDROXYZINE HCL 50 MG PO TABS: 50.0000 mg | ORAL_TABLET | Freq: Two times a day (BID) | ORAL | 1 refills | Status: DC | PRN

## 2023-03-06 MED ORDER — DOXYCYCLINE HYCLATE 100 MG PO TABS: 100.0000 mg | ORAL_TABLET | Freq: Two times a day (BID) | ORAL | 0 refills | Status: AC

## 2023-03-06 NOTE — Assessment & Plan Note (Signed)
The patient  has a LEEP procedure on 03/24/23, and reports increased anxiety She denies suicidal thoughts and ideation Encouraged the patient to take hydroxyzine 50 to 100 mg as needed for increased anxiety

## 2023-03-06 NOTE — Progress Notes (Signed)
Established Patient Office Visit  Subjective:  Patient ID: Jacqueline [name], female    DOB: [birth year]  Age: 47 y.o. MRN: [id number]  CC:  Chief Complaint  Patient presents with   Follow-up    3 month f/u, has anxiety concerns. Pt reports bone spur on her right foot, and small knot on left foot.    Facial Pain    Pt reports sinus pressure and gets worse at night, dealing with this since 12/24/2023.     HPI RICHARDA [name] is a 47 y.o. female with past medical history of generalized anxiety disorder with panic attacks, presents for f/u of  chronic medical conditions. For the details of today's visit, please refer to the assessment and plan.     Come on and go aways 1 aweek s   Past Medical History:  Diagnosis Date   Annual visit for general adult medical examination with abnormal findings 02/22/2020   Depression, major, single episode, moderate ([place]) 12/29/2019   Encounter for TB tine test 02/22/2020   GERD (gastroesophageal reflux disease)    Kidney stones    hx UTI   Need for immunization against influenza 12/29/2019   Sinusitis    URI (upper respiratory infection)    UTI (lower urinary tract infection)    Vaginal Pap smear, abnormal     Past Surgical History:  Procedure Laterality Date   DILATION AND CURETTAGE OF UTERUS     TUBAL LIGATION      Family History  Problem Relation Age of Onset   Diabetes Other    Heart disease Mother    Diabetes Maternal Aunt    Cancer Paternal Aunt        lymph node, breast, and ovarian   Breast cancer Paternal Aunt    Diabetes Maternal Grandmother    Heart disease Maternal Grandmother    Heart failure Maternal Grandmother    Heart attack Maternal Grandfather    Cancer Paternal Grandmother        breast cancer   Breast cancer Paternal Grandmother    Cancer Paternal Grandfather        lung cancer    Social History   Socioeconomic History   Marital status: Single    Spouse name: Not on file   Number of children: 1   Years of  education: Not on file   Highest education level: GED or equivalent  Occupational History   Not on file  Tobacco Use   Smoking status: Every Day    Packs/day: 0.50    Years: 5.00    Total pack years: 2.50    Types: Cigarettes    Last attempt to quit: 05/08/2010    Years since quitting: 12.8   Smokeless tobacco: Never  Vaping Use   Vaping Use: Never used  Substance and Sexual Activity   Alcohol use: No   Drug use: No   Sexual activity: Not Currently    Birth control/protection: Surgical, Post-menopausal    Comment: tubal  Other Topics Concern   Not on file  Social History Narrative   Lives with son Cornelia Copa is 15 years old.   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    Social Determinants of Health   Financial Resource Strain: Medium Risk (02/06/2023)   Overall Financial Resource Strain (CARDIA)    Difficulty of Paying Living Expenses: Somewhat hard  Food Insecurity: No Food Insecurity (02/06/2023)   Hunger Vital Sign    Worried About Running Out of Food in the Last Year: Never true    Ran Out of Food in the Last Year: Never true  Transportation Needs: No Transportation Needs (02/06/2023)   PRAPARE - Hydrologist (Medical): No    Lack of Transportation (Non-Medical): No  Physical Activity: Sufficiently Active (02/06/2023)   Exercise Vital Sign    Days of Exercise per Week: 3 days    Minutes of Exercise per Session: 60 min  Stress: Stress Concern Present (02/06/2023)   [place]    Feeling of Stress : Very much  Social Connections: Moderately Integrated (02/06/2023)   Social Connection and Isolation Panel [NHANES]    Frequency  of Communication with Friends and Family: Twice a week    Frequency of Social Gatherings with Friends and Family: Once a week    Attends Religious Services: More than 4 times per year    Active Member of [employer] or Organizations: Yes    Attends Music therapist: More than 4 times per year    Marital Status: Divorced  Intimate Partner Violence: Not At Risk (02/06/2023)   Humiliation, Afraid, Rape, and Kick questionnaire    Fear of Current or Ex-Partner: No    Emotionally Abused: No    Physically Abused: No    Sexually Abused: No    Outpatient Medications Prior to Visit  Medication Sig Dispense Refill   azelastine (ASTELIN) 0.1 % nasal spray Place 2 sprays into both nostrils 2 (two) times daily. Use in each nostril as directed 30 mL 12   busPIRone (BUSPAR) 7.5 MG tablet Take 1 tablet (7.5 mg total) by mouth 2 (two) times daily. (Patient taking differently: Take 10 mg by mouth 2 (two) times daily.) 60 tablet 1   gabapentin (NEURONTIN) 300 MG capsule Take 300 mg by mouth 3 (three) times daily as needed.     lamoTRIgine (LAMICTAL) 100 MG tablet Take 1 tablet (100 mg total) by mouth daily. 30 tablet 1   sodium chloride (OCEAN) 0.65 % SOLN nasal spray Place 1 spray into both nostrils as needed for congestion. 15 mL 0   Vitamin D, Ergocalciferol, (DRISDOL) 1.25 MG (50000 UNIT) CAPS capsule Take 1 capsule (50,000 Units total) by mouth every 7 (seven) days. 10 capsule 1   hydrOXYzine (ATARAX) 50 MG tablet Take 50-100 mg by mouth 2 (two) times daily as needed.     No facility-administered medications prior to visit.    Allergies  Allergen Reactions   Penicillins Nausea And Vomiting and Rash    ROS Review of Systems  Constitutional:  Negative for chills and fever.  Eyes:  Negative for visual disturbance.  Respiratory:  Negative for chest tightness and shortness of breath.   Musculoskeletal:  Positive for arthralgias.  Neurological:  Negative for dizziness and headaches.       Objective:    Physical Exam HENT:     Head: Normocephalic.     Mouth/Throat:     Mouth: Mucous membranes are moist.  Cardiovascular:     Rate and Rhythm: Normal rate.     Heart sounds: Normal heart  sounds.  Pulmonary:     Effort: Pulmonary effort is normal.     Breath sounds: Normal breath sounds.  Musculoskeletal:     Right foot: Normal range of motion. No deformity or foot drop.       Feet:  Feet:     Right foot:     Skin integrity: No ulcer, erythema or callus.     Comments: Medial malleolus swelling noted No pain with palpation at the right medial longitudinal arch and the right calcaneus +2 dorsalis pedis and posterior tibialis pulse bilaterally    Neurological:     Mental Status: She is alert.     BP 138/82 (BP Location: Left Arm)   Pulse 96   Ht 5' (1.524 m)   Wt 203 lb 1.9 oz (92.1 kg)   LMP 07/24/2022   SpO2 96%   BMI 39.67 kg/m  Wt Readings from Last 3 Encounters:  03/06/23 203 lb 1.9 oz (92.1 kg)  02/24/23 200 lb (90.7 kg)  02/13/23 199 lb 6.4 oz (90.4 kg)    Lab Results  Component Value Date   TSH 2.060 01/19/2023   Lab Results  Component Value Date   WBC 10.8 01/19/2023   HGB 14.9 01/19/2023   HCT 45.8 01/19/2023   MCV 83 01/19/2023   PLT 408 01/19/2023   Lab Results  Component Value Date   NA 142 01/19/2023   K 4.4 01/19/2023   CO2 23 01/19/2023   GLUCOSE 86 01/19/2023   BUN 10 01/19/2023   CREATININE 0.66 01/19/2023   BILITOT 0.5 01/19/2023   ALKPHOS 86 01/19/2023   AST 16 01/19/2023   ALT 17 01/19/2023   PROT 7.1 01/19/2023   ALBUMIN 4.5 01/19/2023   CALCIUM 9.8 01/19/2023   ANIONGAP 6 09/19/2019   EGFR 111 01/19/2023   Lab Results  Component Value Date   CHOL 189 01/19/2023   Lab Results  Component Value Date   HDL 44 01/19/2023   Lab Results  Component Value Date   LDLCALC 121 (H) 01/19/2023   Lab Results  Component Value Date   TRIG 133 01/19/2023   Lab Results  Component Value Date   CHOLHDL 4.3  01/19/2023   Lab Results  Component Value Date   HGBA1C 5.9 (H) 01/19/2023      Assessment & Plan:  Chronic foot pain, right Assessment & Plan: Chronic condition since December 2023 She reports going to [place] for right foot pain  An x-ray was obtained that showed a bone spur in the right foot She was told that her bone spur wouldn't need surgery and was encouraged to implement RICE therapy Patient reports minimal relief of her symptoms with RICE therapy, noting increased pain with ambulation that eventually leads to her leg pain at the end of the day Pain is described as sharp and achy Pain is rated 7-8 out of 10 We will provide a 2-week supply of naproxen 500 mg to take twice daily Encouraged ice application to the affected sites Encouraged use of orthotics such as plantar fascia support insoles Will obtain an x-ray today as I am unable to see the x-ray that was done from her encounter at [place] Encouraged activity modification   Orders: -     DG Foot Complete Right -     Naproxen; Take 1 tablet (500 mg total) by mouth 2 (two) times daily with a meal for 14 days.  Dispense: 28 tablet; Refill: 0  Other chronic sinusitis Assessment & Plan: Ongoing  symptoms since November 2023 Her symptoms wax and wane She has numerous nasal inhalers with minimal relief of her symptoms She complains of facial pressure and pain, nasal congestion with purulent mucus Will be treated today with doxycycline 100 mg twice daily for 5 days A referral will be placed to ENT for further evaluation of the patient recurrent chronic sinusitis Encouraged the use of a heated humidifier to aid with congestion  Orders: -     Doxycycline Hyclate; Take 1 tablet (100 mg total) by mouth 2 (two) times daily for 5 days.  Dispense: 10 tablet; Refill: 0 -     Ambulatory referral to ENT  Generalized anxiety disorder with panic attacks Assessment & Plan: The patient  has a LEEP procedure on 03/24/23, and  reports increased anxiety She denies suicidal thoughts and ideation Encouraged the patient to take hydroxyzine 50 to 100 mg as needed for increased anxiety  Orders: -     hydrOXYzine HCl; Take 1-2 tablets (50-100 mg total) by mouth 2 (two) times daily as needed for anxiety.  Dispense: 30 tablet; Refill: 1    Follow-up: Return in about 6 weeks (around 04/17/2023).   Alvira Monday, FNP

## 2023-03-06 NOTE — Patient Instructions (Addendum)
I appreciate the opportunity to provide care to you today!    Follow up: 6 weeks anxiety  Labs: next vist  Right foot pain I recommend orthotics such as plantar fascia support insoles Please stop by [facility] anytime to get an x-ray of your right foot A prescription for naproxen 500 mg to take twice daily as needed for right foot pain has been sent to your pharmacy I recommend RICE therapy Apply  ice on the painful/ swollen area. To do this: If you have a removable splint, remove it as told by your health care provider. Put ice in a plastic bag. Place a towel between your skin and the bag or between your splint and the bag. Leave the ice on for 20 minutes, 2-3 times a day.    Chronic sinusitis A prescription for doxycycline 100 mg to take twice daily for 5 days has been sent to your pharmacy Please take medication as prescribed Increase fluids and allow for plenty of rest. Recommend using a humidifier at bedtime during sleep to help with cough and nasal congestion.  Anxiety I have sent a prescription for hydroxyzine 50 mg to your pharmacy to take as needed You may take 2 tablets(100 mg total) for increased anxiety as needed   Referrals today- ENT for chronic sinusitis   Please continue to a heart-healthy diet and increase your physical activities. Try to exercise for 53mns at least five times a week.      It was a pleasure to see you and I look forward to continuing to work together on your health and well-being. Please do not hesitate to call the office if you need care or have questions about your care.   Have a wonderful day and week. With Gratitude, GAlvira MondayMSN, FNP-BC

## 2023-03-06 NOTE — Assessment & Plan Note (Signed)
Chronic condition since December 2023 She reports going to [facility] for right foot pain  An x-ray was obtained that showed a bone spur in the right foot She was told that her bone spur wouldn't need surgery and was encouraged to implement RICE therapy Patient reports minimal relief of her symptoms with RICE therapy, noting increased pain with ambulation that eventually leads to her leg pain at the end of the day Pain is described as sharp and achy Pain is rated 7-8 out of 10 We will provide a 2-week supply of naproxen 500 mg to take twice daily Encouraged ice application to the affected sites Encouraged use of orthotics such as plantar fascia support insoles Will obtain an x-ray today as I am unable to see the x-ray that was done from her encounter at [facility] Encouraged activity modification

## 2023-03-06 NOTE — Assessment & Plan Note (Signed)
Ongoing symptoms since November 2023 Her symptoms wax and wane She has numerous nasal inhalers with minimal relief of her symptoms She complains of facial pressure and pain, nasal congestion with purulent mucus Will be treated today with doxycycline 100 mg twice daily for 5 days A referral will be placed to ENT for further evaluation of the patient recurrent chronic sinusitis Encouraged the use of a heated humidifier to aid with congestion

## 2023-03-18 NOTE — Patient Instructions (Signed)
Jacqueline [name]  03/18/2023     @PREFPERIOPPHARMACY @   Your procedure is scheduled on  03/24/2023.   Report to [facility] at  0600  A.M.   Call this number if you have problems the morning of surgery:  [phone]  If you experience any cold or flu symptoms such as cough, fever, chills, shortness of breath, etc. between now and your scheduled surgery, please notify us at the above number.   Remember:  Do not eat or drink after midnight.      Take these medicines the morning of surgery with A SIP OF WATER        buspar, gabapentin, hydroxyzine, lamictal.    Do not wear jewelry, make-up or nail polish.  Do not wear lotions, powders, or perfumes, or deodorant.  Do not shave 48 hours prior to surgery.  Men may shave face and neck.  Do not bring valuables to the hospital.  [facility] is not responsible for any belongings or valuables.  Contacts, dentures or bridgework may not be worn into surgery.  Leave your suitcase in the car.  After surgery it may be brought to your room.  For patients admitted to the hospital, discharge time will be determined by your treatment team.  Patients discharged the day of surgery will not be allowed to drive home and must have someone with them for 24 hours.    Special instructions:   DO NOT smoke tobacco or vape for 24 hours before your procedure.  Please read over the following fact sheets that you were given. Coughing and Deep Breathing, Surgical Site Infection Prevention, Anesthesia Post-op Instructions, and Care and Recovery After Surgery      LEEP POST-PROCEDURE INSTRUCTIONS  You may take Ibuprofen, Aleve or Tylenol for pain if needed.  Cramping is normal.  You will have black and/or bloody discharge at first.  This will lighten and then turn clear before completely resolving.  This will take 2 to 3 weeks.  Put nothing in your vagina until the bleeding or discharge stops (usually 2 or3 days).  You need to call if you have  redness around the biopsy site, if there is any unusual draining, if the bleeding is heavy, or if you are concerned.  Shower or bathe as normal  We will call you within one week with results or we will discuss the results at your follow-up appointment if needed.  You will need to return for a follow-up Pap smear as directed by your physician.General Anesthesia, Adult, Care After The following information offers guidance on how to care for yourself after your procedure. Your health care provider may also give you more specific instructions. If you have problems or questions, contact your health care provider. What can I expect after the procedure? After the procedure, it is common for people to: Have pain or discomfort at the IV site. Have nausea or vomiting. Have a sore throat or hoarseness. Have trouble concentrating. Feel cold or chills. Feel weak, sleepy, or tired (fatigue). Have soreness and body aches. These can affect parts of the body that were not involved in surgery. Follow these instructions at home: For the time period you were told by your health care provider:  Rest. Do not participate in activities where you could fall or become injured. Do not drive or use machinery. Do not drink alcohol. Do not take sleeping pills or medicines that cause drowsiness. Do not make important decisions or sign legal documents.  Do not take care of children on your own. General instructions Drink enough fluid to keep your urine pale yellow. If you have sleep apnea, surgery and certain medicines can increase your risk for breathing problems. Follow instructions from your health care provider about wearing your sleep device: Anytime you are sleeping, including during daytime naps. While taking prescription pain medicines, sleeping medicines, or medicines that make you drowsy. Return to your normal activities as told by your health care provider. Ask your health care provider what activities are  safe for you. Take over-the-counter and prescription medicines only as told by your health care provider. Do not use any products that contain nicotine or tobacco. These products include cigarettes, chewing tobacco, and vaping devices, such as e-cigarettes. These can delay incision healing after surgery. If you need help quitting, ask your health care provider. Contact a health care provider if: You have nausea or vomiting that does not get better with medicine. You vomit every time you eat or drink. You have pain that does not get better with medicine. You cannot urinate or have bloody urine. You develop a skin rash. You have a fever. Get help right away if: You have trouble breathing. You have chest pain. You vomit blood. These symptoms may be an emergency. Get help right away. Call 911. Do not wait to see if the symptoms will go away. Do not drive yourself to the hospital. Summary After the procedure, it is common to have a sore throat, hoarseness, nausea, vomiting, or to feel weak, sleepy, or fatigue. For the time period you were told by your health care provider, do not drive or use machinery. Get help right away if you have difficulty breathing, have chest pain, or vomit blood. These symptoms may be an emergency. This information is not intended to replace advice given to you by your health care provider. Make sure you discuss any questions you have with your health care provider. Document Revised: 03/14/2022 Document Reviewed: 03/14/2022 Elsevier Patient Education  [place].

## 2023-03-20 ENCOUNTER — Encounter (HOSPITAL_COMMUNITY): Payer: Self-pay

## 2023-03-20 ENCOUNTER — Encounter (HOSPITAL_COMMUNITY)
Admission: RE | Admit: 2023-03-20 | Discharge: 2023-03-20 | Disposition: A | Discharge status: Home / Self Care | Payer: Medicaid Other | Source: Ambulatory Visit | Attending: Obstetrics & Gynecology | Admitting: Obstetrics & Gynecology

## 2023-03-20 ENCOUNTER — Other Ambulatory Visit: Payer: Self-pay

## 2023-03-20 VITALS — BP 138/82 | HR 106 | Temp 97.8°F | Resp 18 | Ht 60.0 in | Wt 203.1 lb

## 2023-03-20 DIAGNOSIS — D069 Carcinoma in situ of cervix, unspecified: Secondary | ICD-10-CM | POA: Diagnosis not present

## 2023-03-20 DIAGNOSIS — Z720 Tobacco use: Secondary | ICD-10-CM | POA: Diagnosis not present

## 2023-03-20 DIAGNOSIS — Z01818 Encounter for other preprocedural examination: Secondary | ICD-10-CM

## 2023-03-20 DIAGNOSIS — R030 Elevated blood-pressure reading, without diagnosis of hypertension: Secondary | ICD-10-CM | POA: Diagnosis not present

## 2023-03-20 DIAGNOSIS — I517 Cardiomegaly: Secondary | ICD-10-CM | POA: Diagnosis not present

## 2023-03-20 HISTORY — DX: Anxiety disorder, unspecified: F41.9

## 2023-03-20 LAB — COMPREHENSIVE METABOLIC PANEL
ALT: 21 U/L (ref 0–44)
AST: 18 U/L (ref 15–41)
Albumin: 4.2 g/dL (ref 3.5–5.0)
Alkaline Phosphatase: 73 U/L (ref 38–126)
Anion gap: 9 (ref 5–15)
BUN: 13 mg/dL (ref 6–20)
CO2: 22 mmol/L (ref 22–32)
Calcium: 8.7 mg/dL — ABNORMAL LOW (ref 8.9–10.3)
Chloride: 104 mmol/L (ref 98–111)
Creatinine, Ser: 0.58 mg/dL (ref 0.44–1.00)
GFR, Estimated: >60 mL/min (ref >60)
Glucose, Bld: 92 mg/dL (ref 70–99)
Potassium: 3.6 mmol/L (ref 3.5–5.1)
Sodium: 135 mmol/L (ref 135–145)
Total Bilirubin: 0.9 mg/dL (ref 0.3–1.2)
Total Protein: 7.5 g/dL (ref 6.5–8.1)

## 2023-03-20 LAB — CBC
HCT: 42 % (ref 36.0–46.0)
Hemoglobin: 14 g/dL (ref 12.0–15.0)
MCH: 27.7 pg (ref 26.0–34.0)
MCHC: 33.3 g/dL (ref 30.0–36.0)
MCV: 83 fL (ref 80.0–100.0)
Platelets: 411 10*3/uL — ABNORMAL HIGH (ref 150–400)
RBC: 5.06 MIL/uL (ref 3.87–5.11)
RDW: 14.2 % (ref 11.5–15.5)
WBC: 9.9 10*3/uL (ref 4.0–10.5)
nRBC: 0 % (ref 0.0–0.2)

## 2023-03-20 LAB — POCT PREGNANCY, URINE: Preg Test, Ur: NEGATIVE

## 2023-03-21 ENCOUNTER — Encounter: Payer: Self-pay | Admitting: Obstetrics & Gynecology

## 2023-03-22 NOTE — H&P (Signed)
[facility]necology Attending History and Physical  Jacqueline [name] is a 47 y.o. [phone] who presents for scheduled LEEP due to high grade dysplasia.  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  Denies any abnormal vaginal discharge, fevers, chills, sweats, dysuria, nausea, vomiting, other GI or GU symptoms or other general symptoms.  Past Medical History:  Diagnosis Date   Annual visit for general adult medical examination with abnormal findings 02/22/2020   Anxiety    Depression, major, single episode, moderate ([place]) 12/29/2019   Encounter for TB tine test 02/22/2020   GERD (gastroesophageal reflux disease)    Kidney stones    hx UTI   Need for immunization against influenza 12/29/2019   Sinusitis    URI (upper respiratory infection)    UTI (lower urinary tract infection)    Vaginal Pap smear, abnormal    Past Surgical History:  Procedure Laterality Date   DILATION AND CURETTAGE OF UTERUS     TUBAL LIGATION     OB History  Gravida Para Term Preterm AB Living  3 3 3     3   SAB IAB Ectopic Multiple Live Births               # Outcome Date GA Lbr Len/2nd Weight Sex Delivery Anes PTL Lv  3 Term           2 Term           1 Term           Patient denies any other pertinent gynecologic issues.  No current facility-administered medications on file prior to encounter.   Current Outpatient Medications on File Prior to Encounter  Medication Sig Dispense Refill   azelastine (ASTELIN) 0.1 % nasal spray Place 2 sprays into both nostrils 2 (two) times daily. Use in each nostril as directed (Patient taking differently: Place 2 sprays into both nostrils 2 (two) times daily as needed for allergies. Use in each nostril as directed) 30 mL 12   busPIRone (BUSPAR) 10 MG tablet Take 10 mg by mouth 2 (two) times daily.     gabapentin (NEURONTIN)  300 MG capsule Take 300 mg by mouth 2 (two) times daily. Can take a 300 mg throughout the day as needed for anxiety     lamoTRIgine (LAMICTAL) 100 MG tablet Take 1 tablet (100 mg total) by mouth daily. 30 tablet 1   sodium chloride (OCEAN) 0.65 % SOLN nasal spray Place 1 spray into both nostrils as needed for congestion. 15 mL 0   Vitamin D, Ergocalciferol, (DRISDOL) 1.25 MG (50000 UNIT) CAPS capsule Take 1 capsule (50,000 Units total) by mouth every 7 (seven) days. 10 capsule 1   Allergies  Allergen Reactions   Penicillins Nausea And Vomiting and Rash    Social History:   reports that she has been smoking cigarettes. She has a 2.50 pack-year smoking history. She has never used smokeless tobacco. She reports that she does not drink alcohol and does not use drugs. Family History  Problem Relation Age of Onset   Diabetes Other    Heart disease Mother    Diabetes Maternal Aunt    Cancer Paternal Aunt        lymph node, breast, and ovarian   Breast cancer Paternal Aunt    Diabetes Maternal Grandmother    Heart disease Maternal Grandmother  Heart failure Maternal Grandmother    Heart attack Maternal Grandfather    Cancer Paternal Grandmother        breast cancer   Breast cancer Paternal Grandmother    Cancer Paternal Grandfather        lung cancer    Review of Systems: Pertinent items noted in HPI and remainder of comprehensive ROS otherwise negative.  PHYSICAL EXAM: Last menstrual period 07/24/2022. CONSTITUTIONAL: Well-developed, well-nourished female in no acute distress.  SKIN: Skin is warm and dry. No rash noted. Not diaphoretic. No erythema. No pallor. NEUROLOGIC: Alert and oriented to person, place, and time. Normal reflexes, muscle tone coordination. No cranial nerve deficit noted. PSYCHIATRIC: Normal mood and affect. Normal behavior. Normal judgment and thought content. CARDIOVASCULAR: Normal heart rate noted, regular rhythm RESPIRATORY: Effort and breath sounds normal,  no problems with respiration noted ABDOMEN: Soft, nontender, nondistended. PELVIC: deferred MUSCULOSKELETAL: no calf tenderness bilaterally EXT: no edema bilaterally, normal pulses  Labs: Results for orders placed or performed during the hospital encounter of 03/20/23 (from the past 336 hour(s))  CBC   Collection Time: 03/20/23  8:48 AM  Result Value Ref Range   WBC 9.9 4.0 - 10.5 K/uL   RBC 5.06 3.87 - 5.11 MIL/uL   Hemoglobin 14.0 12.0 - 15.0 g/dL   HCT 42.0 36.0 - 46.0 %   MCV 83.0 80.0 - 100.0 fL   MCH 27.7 26.0 - 34.0 pg   MCHC 33.3 30.0 - 36.0 g/dL   RDW 14.2 11.5 - 15.5 %   Platelets 411 (H) 150 - 400 K/uL   nRBC 0.0 0.0 - 0.2 %  Comprehensive metabolic panel   Collection Time: 03/20/23  8:48 AM  Result Value Ref Range   Sodium 135 135 - 145 mmol/L   Potassium 3.6 3.5 - 5.1 mmol/L   Chloride 104 98 - 111 mmol/L   CO2 22 22 - 32 mmol/L   Glucose, Bld 92 70 - 99 mg/dL   BUN 13 6 - 20 mg/dL   Creatinine, Ser 0.58 0.44 - 1.00 mg/dL   Calcium 8.7 (L) 8.9 - 10.3 mg/dL   Total Protein 7.5 6.5 - 8.1 g/dL   Albumin 4.2 3.5 - 5.0 g/dL   AST 18 15 - 41 U/L   ALT 21 0 - 44 U/L   Alkaline Phosphatase 73 38 - 126 U/L   Total Bilirubin 0.9 0.3 - 1.2 mg/dL   GFR, Estimated >60 >60 mL/min   Anion gap 9 5 - 15  Pregnancy, urine POC   Collection Time: 03/20/23  8:54 AM  Result Value Ref Range   Preg Test, Ur NEGATIVE NEGATIVE    Assessment: High grade cervical dysplasia   Plan: LEEP -NPO -LR @ 125cc/hr -SCDs to OR -Risk/benefits and alternatives reviewed with the patient including but not limited to risk of bleeding, infection and injury to surrounding organs.  Questions and concerns were addressed and pt desires to proceed  Janyth Pupa, DO Attending [place], [facility] for [facility], [place]

## 2023-03-23 DIAGNOSIS — R32 Unspecified urinary incontinence: Secondary | ICD-10-CM | POA: Diagnosis not present

## 2023-03-23 DIAGNOSIS — E119 Type 2 diabetes mellitus without complications: Secondary | ICD-10-CM | POA: Diagnosis not present

## 2023-03-24 ENCOUNTER — Encounter (HOSPITAL_COMMUNITY): Payer: Self-pay | Admitting: Obstetrics & Gynecology

## 2023-03-24 ENCOUNTER — Other Ambulatory Visit: Payer: Self-pay

## 2023-03-24 ENCOUNTER — Ambulatory Visit (HOSPITAL_COMMUNITY)
Admission: RE | Admit: 2023-03-24 | Discharge: 2023-03-24 | Disposition: A | Discharge status: Home / Self Care | Payer: Medicaid Other | Attending: Obstetrics & Gynecology | Admitting: Obstetrics & Gynecology

## 2023-03-24 ENCOUNTER — Ambulatory Visit (HOSPITAL_BASED_OUTPATIENT_CLINIC_OR_DEPARTMENT_OTHER): Payer: Medicaid Other | Admitting: Anesthesiology

## 2023-03-24 ENCOUNTER — Encounter (HOSPITAL_COMMUNITY)
Admission: RE | Disposition: A | Discharge status: Home / Self Care | Payer: Self-pay | Source: Home / Self Care | Attending: Obstetrics & Gynecology

## 2023-03-24 ENCOUNTER — Ambulatory Visit (HOSPITAL_COMMUNITY): Payer: Medicaid Other | Admitting: Anesthesiology

## 2023-03-24 DIAGNOSIS — K219 Gastro-esophageal reflux disease without esophagitis: Secondary | ICD-10-CM | POA: Diagnosis not present

## 2023-03-24 DIAGNOSIS — F419 Anxiety disorder, unspecified: Secondary | ICD-10-CM | POA: Diagnosis not present

## 2023-03-24 DIAGNOSIS — N871 Moderate cervical dysplasia: Secondary | ICD-10-CM

## 2023-03-24 DIAGNOSIS — D069 Carcinoma in situ of cervix, unspecified: Secondary | ICD-10-CM | POA: Diagnosis not present

## 2023-03-24 DIAGNOSIS — F17210 Nicotine dependence, cigarettes, uncomplicated: Secondary | ICD-10-CM | POA: Diagnosis not present

## 2023-03-24 HISTORY — PX: LEEP: SHX91

## 2023-03-24 SURGERY — LEEP (LOOP ELECTROSURGICAL EXCISION PROCEDURE)
Anesthesia: General | Site: Vagina

## 2023-03-24 MED ORDER — ROCURONIUM BROMIDE 10 MG/ML (PF) SYRINGE
PREFILLED_SYRINGE | INTRAVENOUS | Status: AC
  Filled 2023-03-24: qty 10

## 2023-03-24 MED ORDER — 0.9 % SODIUM CHLORIDE (POUR BTL) OPTIME
TOPICAL | Status: DC | PRN
  Administered 2023-03-24: 1000 mL

## 2023-03-24 MED ORDER — CHLORHEXIDINE GLUCONATE 0.12 % MT SOLN
15.0000 mL | Freq: Once | OROMUCOSAL | Status: AC
  Administered 2023-03-24: 15 mL via OROMUCOSAL

## 2023-03-24 MED ORDER — LACTATED RINGERS IV SOLN: INTRAVENOUS | Status: DC

## 2023-03-24 MED ORDER — HYDROMORPHONE HCL 1 MG/ML IJ SOLN: 0.2500 mg | INTRAMUSCULAR | Status: DC | PRN

## 2023-03-24 MED ORDER — LIDOCAINE-EPINEPHRINE 0.5 %-1:200000 IJ SOLN
INTRAMUSCULAR | Status: AC
  Filled 2023-03-24: qty 50

## 2023-03-24 MED ORDER — MIDAZOLAM HCL 2 MG/2ML IJ SOLN
INTRAMUSCULAR | Status: DC | PRN
  Administered 2023-03-24: 2 mg via INTRAVENOUS

## 2023-03-24 MED ORDER — FERRIC SUBSULFATE (BULK) SOLN
Status: DC | PRN
  Administered 2023-03-24: 1

## 2023-03-24 MED ORDER — DEXAMETHASONE SODIUM PHOSPHATE 10 MG/ML IJ SOLN
INTRAMUSCULAR | Status: AC
  Filled 2023-03-24: qty 1

## 2023-03-24 MED ORDER — FENTANYL CITRATE (PF) 100 MCG/2ML IJ SOLN
INTRAMUSCULAR | Status: DC | PRN
  Administered 2023-03-24: 100 ug via INTRAVENOUS

## 2023-03-24 MED ORDER — ACETAMINOPHEN 325 MG PO TABS: 650.0000 mg | ORAL_TABLET | Freq: Four times a day (QID) | ORAL | Status: AC | PRN

## 2023-03-24 MED ORDER — MEPERIDINE HCL 50 MG/ML IJ SOLN: 6.2500 mg | INTRAMUSCULAR | Status: DC | PRN

## 2023-03-24 MED ORDER — FERRIC SUBSULFATE 259 MG/GM EX SOLN
CUTANEOUS | Status: AC
  Filled 2023-03-24: qty 8

## 2023-03-24 MED ORDER — ONDANSETRON HCL 4 MG/2ML IJ SOLN
INTRAMUSCULAR | Status: AC
  Filled 2023-03-24: qty 2

## 2023-03-24 MED ORDER — MIDAZOLAM HCL 2 MG/2ML IJ SOLN
INTRAMUSCULAR | Status: AC
  Filled 2023-03-24: qty 2

## 2023-03-24 MED ORDER — LIDOCAINE HCL (PF) 2 % IJ SOLN
INTRAMUSCULAR | Status: AC
  Filled 2023-03-24: qty 5

## 2023-03-24 MED ORDER — ORAL CARE MOUTH RINSE: 15.0000 mL | Freq: Once | OROMUCOSAL | Status: AC

## 2023-03-24 MED ORDER — ROCURONIUM BROMIDE 100 MG/10ML IV SOLN
INTRAVENOUS | Status: DC | PRN
  Administered 2023-03-24: 50 mg via INTRAVENOUS

## 2023-03-24 MED ORDER — ACETIC ACID 4% SOLUTION
Status: DC | PRN
  Administered 2023-03-24: 1 via TOPICAL

## 2023-03-24 MED ORDER — FENTANYL CITRATE (PF) 100 MCG/2ML IJ SOLN
INTRAMUSCULAR | Status: AC
  Filled 2023-03-24: qty 2

## 2023-03-24 MED ORDER — PROPOFOL 10 MG/ML IV BOLUS
INTRAVENOUS | Status: DC | PRN
  Administered 2023-03-24: 150 mg via INTRAVENOUS
  Administered 2023-03-24: 30 mg via INTRAVENOUS

## 2023-03-24 MED ORDER — LACTATED RINGERS IV SOLN
INTRAVENOUS | Status: DC
  Administered 2023-03-24: 1000 mL via INTRAVENOUS

## 2023-03-24 MED ORDER — DEXAMETHASONE SODIUM PHOSPHATE 10 MG/ML IJ SOLN
INTRAMUSCULAR | Status: DC | PRN
  Administered 2023-03-24: 5 mg via INTRAVENOUS

## 2023-03-24 MED ORDER — ONDANSETRON HCL 4 MG/2ML IJ SOLN: 4.0000 mg | Freq: Once | INTRAMUSCULAR | Status: DC | PRN

## 2023-03-24 MED ORDER — SUGAMMADEX SODIUM 500 MG/5ML IV SOLN
INTRAVENOUS | Status: DC | PRN
  Administered 2023-03-24: 200 mg via INTRAVENOUS

## 2023-03-24 MED ORDER — ONDANSETRON HCL 4 MG/2ML IJ SOLN
INTRAMUSCULAR | Status: DC | PRN
  Administered 2023-03-24: 4 mg via INTRAVENOUS

## 2023-03-24 MED ORDER — LIDOCAINE HCL (CARDIAC) PF 100 MG/5ML IV SOSY
PREFILLED_SYRINGE | INTRAVENOUS | Status: DC | PRN
  Administered 2023-03-24: 80 mg via INTRAVENOUS

## 2023-03-24 MED ORDER — PROPOFOL 10 MG/ML IV BOLUS
INTRAVENOUS | Status: AC
  Filled 2023-03-24: qty 20

## 2023-03-24 MED ORDER — LIDOCAINE-EPINEPHRINE 0.5 %-1:200000 IJ SOLN
INTRAMUSCULAR | Status: DC | PRN
  Administered 2023-03-24: 17 mL

## 2023-03-24 SURGICAL SUPPLY — 36 items
APL SWBSTK 6 STRL LF DISP (MISCELLANEOUS) ×1
APPLICATOR COTTON TIP 6 STRL (MISCELLANEOUS) ×1 IMPLANT
APPLICATOR COTTON TIP 6IN STRL (MISCELLANEOUS) ×1
CLOTH BEACON ORANGE TIMEOUT ST (SAFETY) ×1 IMPLANT
COVER LIGHT HANDLE STERIS (MISCELLANEOUS) ×2 IMPLANT
ELECT BALL LEEP 5MM RED (ELECTRODE) ×1 IMPLANT
ELECT LOOP LEEP RND 10X10 YLW (CUTTING LOOP)
ELECT LOOP LEEP RND 15X12 GRN (CUTTING LOOP)
ELECT LOOP LEEP RND 20X12 WHT (CUTTING LOOP) ×1
ELECT REM PT RETURN 9FT ADLT (ELECTROSURGICAL) ×1
ELECTRODE LOOP LP RND 10X10YLW (CUTTING LOOP) IMPLANT
ELECTRODE LOOP LP RND 15X12GRN (CUTTING LOOP) IMPLANT
ELECTRODE LOOP LP RND 20X12WHT (CUTTING LOOP) IMPLANT
ELECTRODE REM PT RTRN 9FT ADLT (ELECTROSURGICAL) ×1 IMPLANT
EXTENDER ELECT LOOP LEEP 10CM (CUTTING LOOP) IMPLANT
GAUZE 4X4 16PLY ~~LOC~~+RFID DBL (SPONGE) ×2 IMPLANT
GLOVE BIO SURGEON STRL SZ 6.5 (GLOVE) ×1 IMPLANT
GLOVE BIO SURGEON STRL SZ7 (GLOVE) ×1 IMPLANT
GLOVE BIOGEL PI IND STRL 7.0 (GLOVE) ×3 IMPLANT
GOWN STRL REUS W/ TWL LRG LVL3 (GOWN DISPOSABLE) ×1 IMPLANT
GOWN STRL REUS W/TWL LRG LVL3 (GOWN DISPOSABLE) ×2 IMPLANT
KIT TURNOVER CYSTO (KITS) ×1 IMPLANT
KIT TURNOVER KIT A (KITS) ×1 IMPLANT
NDL HYPO 18GX1.5 BLUNT FILL (NEEDLE) ×1 IMPLANT
NEEDLE HYPO 18GX1.5 BLUNT FILL (NEEDLE) ×1 IMPLANT
NS IRRIG 1000ML POUR BTL (IV SOLUTION) ×1 IMPLANT
NS IRRIG 500ML POUR BTL (IV SOLUTION) ×1 IMPLANT
PACK PERI GYN (CUSTOM PROCEDURE TRAY) ×1 IMPLANT
PAD ARMBOARD 7.5X6 YLW CONV (MISCELLANEOUS) ×1 IMPLANT
PAD TELFA 3X4 1S STER (GAUZE/BANDAGES/DRESSINGS) ×1 IMPLANT
SCOPETTES 8  STERILE (MISCELLANEOUS) ×1
SCOPETTES 8 STERILE (MISCELLANEOUS) ×1 IMPLANT
SET BASIN LINEN APH (SET/KITS/TRAYS/PACK) ×1 IMPLANT
SUT VIC AB 0 CT1 27 (SUTURE) ×1
SUT VIC AB 0 CT1 27XBRD ANBCTR (SUTURE) ×1 IMPLANT
SYR CONTROL 10ML LL (SYRINGE) ×1 IMPLANT

## 2023-03-24 NOTE — Op Note (Signed)
OPERATIVE NOTE  PREOPERATIVE DIAGNOSIS:  High grade dysplasia POSTOPERATIVE DIAGNOSIS: same PROCEDURE PERFORMED: Colposcopy, LEEP SURGEON: Dr. Janyth Pupa ANESTHESIA: General endotracheal.  ESTIMATED BLOOD LOSS: minimal-5cc.  IV FLUIDS: 800cc of crystalloid.  SPECIMEN(S): 1) cervical biopsy x 2- outer and inner margin COMPLICATIONS: None.  CONDITION: Stable.  FINDINGS: Cervical dysplasia noted- Acteowhite changes with punctation- most noticable at 12 o'clock  ??Procedure: Informed consent was obtained from the patient prior to taking her to the operating room where anesthesia was found to be adequate. She was placed in dorsal lithotomy.  She was prepped and draped in normal sterile fashion.  A bivalved coated speculum was placed in the patient's vagina. A grounding pad placed on the patient. Acetic acid solution was applied to the cervix and areas of decreased uptake were noted around the transformation zone.   Local anesthesia was administered via an intracervical block using 17 ml of 0.5% Lidocaine with epinephrine. The suction was turned on and the Medium 1X Fisher Cone Biopsy Excisor on 81 Watts of blended current was used to excise the area of decreased uptake and excise the entire transformation zone. A smaller inner margin was obtained in a similar fashion.  Excellent hemostasis was achieved using roller ball coagulation set at 50 Watts coagulation current. Monsel's solution was then applied and the speculum was removed from the vagina. Specimens were sent to pathology.  Counts were correct and pt was taken to recovery room in stable condition.  Janyth Pupa, DO Attending [place], [facility] for [employer], [place]

## 2023-03-24 NOTE — Discharge Instructions (Signed)
HOME INSTRUCTIONS  Please note any unusual or excessive bleeding, pain, swelling. Mild dizziness or drowsiness are normal for about 24 hours after surgery.   Shower when comfortable  Restrictions: No driving for 24 hours or while taking pain medications.  Activity:  Nothing in vagina (no tampons, douching, or intercourse) x 4 weeks; no tub baths for 4 weeks Vaginal spotting is expected but if your bleeding is heavy, period like,  please call the office   Diet:  You may return to your regular diet.  Do not eat large meals.  Eat small frequent meals throughout the day.  Continue to drink a good amount of water at least 6-8 glasses of water per day, hydration is very important for the healing process.  Pain Management: Take over the counter tylenol or ibuprofen as needed for pain.  You can either take one or alternate between the two medications for pain management.  You may also use a heating pack as needed.    Alcohol -- Avoid for 24 hours and while taking pain medications.  Nausea: Take sips of ginger ale or soda  Fever -- Call physician if temperature over 101 degrees  Follow up:  If you do not already have a follow up appointment scheduled, please call the office at [phone].  If you experience fever (a temperature greater than 100.4), pain unrelieved by pain medication, shortness of breath, swelling of a single leg, or any other symptoms which are concerning to you please the office immediately.  

## 2023-03-24 NOTE — Anesthesia Preprocedure Evaluation (Addendum)
Anesthesia Evaluation  Patient identified by MRN, date of birth, ID band Patient awake    Reviewed: Allergy & Precautions, H&P , NPO status , Patient's Chart, lab work & pertinent test results  Airway Mallampati: II  TM Distance: >3 FB Neck ROM: Full    Dental  (+) Dental Advisory Given, Chipped,    Pulmonary Current Smoker and Patient abstained from smoking.   Pulmonary exam normal breath sounds clear to auscultation       Cardiovascular negative cardio ROS Normal cardiovascular exam Rhythm:Regular Rate:Normal     Neuro/Psych  PSYCHIATRIC DISORDERS Anxiety Depression     Neuromuscular disease    GI/Hepatic Neg liver ROS,GERD  Controlled,,  Endo/Other  negative endocrine ROS    Renal/GU Renal disease ([place])  negative genitourinary   Musculoskeletal negative musculoskeletal ROS (+)    Abdominal   Peds negative pediatric ROS (+)  Hematology negative hematology ROS (+)   Anesthesia Other Findings   Reproductive/Obstetrics negative OB ROS                             Anesthesia Physical Anesthesia Plan  ASA: 2  Anesthesia Plan: General   Post-op Pain Management: Dilaudid IV   Induction: Intravenous  PONV Risk Score and Plan: 4 or greater and Ondansetron, Dexamethasone and Midazolam  Airway Management Planned: Oral ETT  Additional Equipment:   Intra-op Plan:   Post-operative Plan:   Informed Consent: I have reviewed the patients History and Physical, chart, labs and discussed the procedure including the risks, benefits and alternatives for the proposed anesthesia with the patient or authorized representative who has indicated his/her understanding and acceptance.     Dental advisory given  Plan Discussed with: CRNA and Surgeon  Anesthesia Plan Comments:        Anesthesia Quick Evaluation

## 2023-03-24 NOTE — Anesthesia Procedure Notes (Signed)
Procedure Name: Intubation Date/Time: 03/24/2023 7:32 AM  Performed by: Vista Deck, CRNAPre-anesthesia Checklist: Patient identified, Patient being monitored, Timeout performed, Emergency Drugs available and Suction available Patient Re-evaluated:Patient Re-evaluated prior to induction Oxygen Delivery Method: Circle system utilized Preoxygenation: Pre-oxygenation with 100% oxygen Induction Type: IV induction Ventilation: Mask ventilation without difficulty Laryngoscope Size: Mac and 3 Grade View: Grade I Tube type: Oral Tube size: 7.0 mm Number of attempts: 1 Airway Equipment and Method: Stylet Placement Confirmation: ETT inserted through vocal cords under direct vision, positive ETCO2 and breath sounds checked- equal and bilateral Secured at: 21 cm Tube secured with: Tape Dental Injury: Teeth and Oropharynx as per pre-operative assessment

## 2023-03-24 NOTE — Transfer of Care (Signed)
Immediate Anesthesia Transfer of Care Note  Patient: Jacqueline [name]  Procedure(s) Performed: LOOP ELECTROSURGICAL EXCISION PROCEDURE (LEEP) (Vagina )  Patient Location: PACU  Anesthesia Type:General  Level of Consciousness: awake and patient cooperative  Airway & Oxygen Therapy: Patient Spontanous Breathing  Post-op Assessment: Report given to RN and Post -op Vital signs reviewed and stable  Post vital signs: Reviewed and stable  Last Vitals:  Vitals Value Taken Time  BP 125/90 03/24/23 0812  Temp 98.1 03/24/2023 0813  Pulse 92 03/24/2023 0813  Resp 15 03/24/23 0813  SpO2 96 03/24/2023 0813  Vitals shown include unvalidated device data.  Last Pain:  Vitals:   03/24/23 0635  TempSrc: Oral  PainSc: 0-No pain      Patients Stated Pain Goal: 7 ([id number])  Complications: No notable events documented.

## 2023-03-24 NOTE — Anesthesia Postprocedure Evaluation (Signed)
Anesthesia Post Note  Patient: Jacqueline [name]  Procedure(s) Performed: LOOP ELECTROSURGICAL EXCISION PROCEDURE (LEEP) (Vagina )  Patient location during evaluation: Phase II Anesthesia Type: General Level of consciousness: awake and alert and oriented Pain management: pain level controlled Vital Signs Assessment: post-procedure vital signs reviewed and stable Respiratory status: spontaneous breathing, nonlabored ventilation and respiratory function stable Cardiovascular status: blood pressure returned to baseline and stable Postop Assessment: no apparent nausea or vomiting Anesthetic complications: no  No notable events documented.   Last Vitals:  Vitals:   03/24/23 0830 03/24/23 0845  BP: 123/89 137/89  Pulse: 85 83  Resp: 16 19  Temp:  36.7 C  SpO2: 91% 98%    Last Pain:  Vitals:   03/24/23 0852  TempSrc:   PainSc: 2                  Mattthew Ziomek C Mahalia Dykes

## 2023-03-26 ENCOUNTER — Encounter: Payer: Self-pay | Admitting: Obstetrics & Gynecology

## 2023-03-26 LAB — SURGICAL PATHOLOGY

## 2023-04-01 ENCOUNTER — Ambulatory Visit (INDEPENDENT_AMBULATORY_CARE_PROVIDER_SITE_OTHER): Payer: Medicaid Other | Admitting: Obstetrics & Gynecology

## 2023-04-01 ENCOUNTER — Encounter: Payer: Self-pay | Admitting: Obstetrics & Gynecology

## 2023-04-01 ENCOUNTER — Other Ambulatory Visit: Payer: Self-pay | Admitting: Family Medicine

## 2023-04-01 VITALS — BP 150/95 | HR 97 | Ht 60.0 in | Wt 200.4 lb

## 2023-04-01 DIAGNOSIS — Z9889 Other specified postprocedural states: Secondary | ICD-10-CM | POA: Diagnosis not present

## 2023-04-01 DIAGNOSIS — G8929 Other chronic pain: Secondary | ICD-10-CM

## 2023-04-01 NOTE — Progress Notes (Signed)
    PostOp Visit Note  Jacqueline [name] is a 47 y.o. [id number] female who presents for a postoperative visit. She is 1 week postop following a LEEP completed on 03/24/2023   Today she notes that yesterday was a like a "light period" and some black discharge. Denies irregular odor or itching.  Denies pelvic or abdominal pain.  Overall doing well and reports no acute complaints   Review of Systems Pertinent items are noted in HPI.    Objective:  BP (!) 150/95 (BP Location: Left Arm, Patient Position: Sitting, Cuff Size: Normal)   Pulse 97   Ht 5' (1.524 m)   Wt 200 lb 6.4 oz (90.9 kg)   LMP 07/24/2022   BMI 39.14 kg/m    Physical Examination:  GENERAL ASSESSMENT: well developed and well nourished SKIN: normal color, no lesions CHEST: normal air exchange, respiratory effort normal with no retractions HEART: regular rate and rhythm ABDOMEN: soft, non-distended GU: normal external genitalia, vagina- pink mucosa, no abnormal discharge.  Cervix healing appropriately EXTREMITY: no edema, no calf tenderness PSYCH: mood appropriate, normal affect       Assessment:    S/p LEEP   Plan:   -healing appropriately -f/u in 58mos for repeat pap  Janyth Pupa, DO Attending [place], [place] for [place], [place]

## 2023-04-02 ENCOUNTER — Encounter (HOSPITAL_COMMUNITY): Payer: Self-pay | Admitting: Obstetrics & Gynecology

## 2023-04-02 DIAGNOSIS — F341 Dysthymic disorder: Secondary | ICD-10-CM | POA: Diagnosis not present

## 2023-04-02 DIAGNOSIS — F4381 Prolonged grief disorder: Secondary | ICD-10-CM | POA: Diagnosis not present

## 2023-04-02 DIAGNOSIS — F4312 Post-traumatic stress disorder, chronic: Secondary | ICD-10-CM | POA: Diagnosis not present

## 2023-04-02 DIAGNOSIS — F411 Generalized anxiety disorder: Secondary | ICD-10-CM | POA: Diagnosis not present

## 2023-04-06 ENCOUNTER — Encounter: Payer: Self-pay | Admitting: Family Medicine

## 2023-04-08 ENCOUNTER — Other Ambulatory Visit: Payer: Self-pay | Admitting: Family Medicine

## 2023-04-08 DIAGNOSIS — F410 Panic disorder [episodic paroxysmal anxiety] without agoraphobia: Secondary | ICD-10-CM

## 2023-04-08 DIAGNOSIS — F5104 Psychophysiologic insomnia: Secondary | ICD-10-CM

## 2023-04-08 MED ORDER — LAMOTRIGINE 100 MG PO TABS: 100.0000 mg | ORAL_TABLET | Freq: Every day | ORAL | 1 refills | Status: DC

## 2023-04-20 ENCOUNTER — Ambulatory Visit (INDEPENDENT_AMBULATORY_CARE_PROVIDER_SITE_OTHER): Payer: Medicaid Other | Admitting: Family Medicine

## 2023-04-20 VITALS — BP 136/89 | HR 90 | Ht 60.0 in | Wt 204.0 lb

## 2023-04-20 DIAGNOSIS — F410 Panic disorder [episodic paroxysmal anxiety] without agoraphobia: Secondary | ICD-10-CM

## 2023-04-20 DIAGNOSIS — F411 Generalized anxiety disorder: Secondary | ICD-10-CM | POA: Diagnosis not present

## 2023-04-20 NOTE — Assessment & Plan Note (Signed)
GAD-7 is 6 She denies suicidal thoughts and ideation She takes lamictal 100 mg daily as a mood stabilizer, hydroxyzine 50- 100 mg twice daily as needed for anxiety, and BuSpar 10 mg twice daily She reports compliance with treatment regimen

## 2023-04-20 NOTE — Patient Instructions (Addendum)
I appreciate the opportunity to provide care to you today!    Follow up:  3 months  Labs: next visit     Please continue to a heart-healthy diet and increase your physical activities. Try to exercise for 30mins at least five days a week.      It was a pleasure to see you and I look forward to continuing to work together on your health and well-being. Please do not hesitate to call the office if you need care or have questions about your care.   Have a wonderful day and week. With Gratitude, Carol Theys MSN, FNP-BC  

## 2023-04-20 NOTE — Progress Notes (Signed)
Established Patient Office Visit  Subjective:  Patient ID: Jacqueline [name], female    DOB: [birth year]  Age: 47 y.o. MRN: [id number]  CC:  Chief Complaint  Patient presents with   Follow-up   Anxiety    Follow up    HPI Jacqueline [name] is a 47 y.o. female  presents for for anxiety follow-up. For the details of today's visit, please refer to the assessment and plan. For the details of today's visit, please refer to the assessment and plan.         Past Medical History:  Diagnosis Date   Annual visit for general adult medical examination with abnormal findings 02/22/2020   Anxiety    Depression, major, single episode, moderate 12/29/2019   Encounter for TB tine test 02/22/2020   GERD (gastroesophageal reflux disease)    Kidney stones    hx UTI   Need for immunization against influenza 12/29/2019   Sinusitis    URI (upper respiratory infection)    UTI (lower urinary tract infection)    Vaginal Pap smear, abnormal     Past Surgical History:  Procedure Laterality Date   DILATION AND CURETTAGE OF UTERUS     LEEP  03/24/2023   LEEP N/A 03/24/2023   Procedure: LOOP ELECTROSURGICAL EXCISION PROCEDURE (LEEP);  Surgeon: Myna Hidalgo, DO;  Location: [facility];  Service: Gynecology;  Laterality: N/A;   TUBAL LIGATION      Family History  Problem Relation Age of Onset   Diabetes Other    Heart disease Mother    Diabetes Maternal Aunt    Cancer Paternal Aunt        lymph node, breast, and ovarian   Breast cancer Paternal Aunt    Diabetes Maternal Grandmother    Heart disease Maternal Grandmother    Heart failure Maternal Grandmother    Heart attack Maternal Grandfather    Cancer Paternal Grandmother        breast cancer   Breast cancer Paternal Grandmother    Cancer Paternal Grandfather        lung cancer    Social History   Socioeconomic History   Marital status: Single    Spouse name: Not on file   Number of children: 1   Years of education: Not on file   Highest  education level: Associate degree: academic program  Occupational History   Not on file  Tobacco Use   Smoking status: Every Day    Packs/day: 0.50    Years: 5.00    Additional pack years: 0.00    Total pack years: 2.50    Types: Cigarettes    Last attempt to quit: 05/08/2010    Years since quitting: 12.9   Smokeless tobacco: Never  Vaping Use   Vaping Use: Never used  Substance and Sexual Activity   Alcohol use: No   Drug use: No   Sexual activity: Not Currently    Birth control/protection: Surgical, Post-menopausal    Comment: tubal  Other Topics Concern   Not on file  Social History Narrative   Lives with son Marlene Bast is 4 years old.   Recent passing of fiance in oct 2020 from massive MI      Is wanting to go back to school for early childhood education      Diet: currently not eating a lot due to depression   Caffeine: pepsis   Water: daily 3-5 cups      Wears seat belt   Does not use  phone while driving    Smokes detectors in home    Social Determinants of Health   Financial Resource Strain: Medium Risk (02/06/2023)   Overall Financial Resource Strain (CARDIA)    Difficulty of Paying Living Expenses: Somewhat hard  Food Insecurity: No Food Insecurity (04/20/2023)   Hunger Vital Sign    Worried About Running Out of Food in the Last Year: Never true    Ran Out of Food in the Last Year: Never true  Transportation Needs: No Transportation Needs (04/20/2023)   PRAPARE - Administrator, Civil Service (Medical): No    Lack of Transportation (Non-Medical): No  Physical Activity: Sufficiently Active (04/20/2023)   Exercise Vital Sign    Days of Exercise per Week: 3 days    Minutes of Exercise per Session: 60 min  Stress: Stress Concern Present (04/20/2023)   [employer] of Occupational Health - Occupational Stress Questionnaire    Feeling of Stress : To some extent  Social Connections: Moderately Integrated (04/20/2023)   Social Connection and Isolation  Panel [NHANES]    Frequency of Communication with Friends and Family: More than three times a week    Frequency of Social Gatherings with Friends and Family: More than three times a week    Attends Religious Services: More than 4 times per year    Active Member of [employer] or Organizations: Yes    Attends Engineer, structural: More than 4 times per year    Marital Status: Divorced  Intimate Partner Violence: Not At Risk (02/06/2023)   Humiliation, Afraid, Rape, and Kick questionnaire    Fear of Current or Ex-Partner: No    Emotionally Abused: No    Physically Abused: No    Sexually Abused: No    Outpatient Medications Prior to Visit  Medication Sig Dispense Refill   acetaminophen (TYLENOL) 325 MG tablet Take 2 tablets (650 mg total) by mouth every 6 (six) hours as needed.     azelastine (ASTELIN) 0.1 % nasal spray Place 2 sprays into both nostrils 2 (two) times daily. Use in each nostril as directed (Patient taking differently: Place 2 sprays into both nostrils 2 (two) times daily as needed for allergies. Use in each nostril as directed) 30 mL 12   busPIRone (BUSPAR) 10 MG tablet Take 10 mg by mouth 2 (two) times daily.     gabapentin (NEURONTIN) 300 MG capsule Take 300 mg by mouth 2 (two) times daily. Can take a 300 mg throughout the day as needed for anxiety (Patient not taking: Reported on 04/20/2023)     hydrOXYzine (ATARAX) 50 MG tablet Take 1-2 tablets (50-100 mg total) by mouth 2 (two) times daily as needed for anxiety. 30 tablet 1   lamoTRIgine (LAMICTAL) 100 MG tablet Take 1 tablet (100 mg total) by mouth daily. 30 tablet 1   sodium chloride (OCEAN) 0.65 % SOLN nasal spray Place 1 spray into both nostrils as needed for congestion. 15 mL 0   Vitamin D, Ergocalciferol, (DRISDOL) 1.25 MG (50000 UNIT) CAPS capsule Take 1 capsule (50,000 Units total) by mouth every 7 (seven) days. 10 capsule 1   No facility-administered medications prior to visit.    Allergies  Allergen Reactions    Penicillins Nausea And Vomiting and Rash    ROS Review of Systems  Constitutional:  Negative for chills and fever.  Eyes:  Negative for visual disturbance.  Respiratory:  Negative for chest tightness and shortness of breath.   Neurological:  Negative for dizziness  and headaches.      Objective:    Physical Exam HENT:     Head: Normocephalic.     Mouth/Throat:     Mouth: Mucous membranes are moist.  Cardiovascular:     Rate and Rhythm: Normal rate.     Heart sounds: Normal heart sounds.  Pulmonary:     Effort: Pulmonary effort is normal.     Breath sounds: Normal breath sounds.  Neurological:     Mental Status: She is alert.     BP 136/89   Pulse 90   Ht 5' (1.524 m)   Wt 204 lb (92.5 kg)   LMP 07/24/2022   SpO2 95%   BMI 39.84 kg/m  Wt Readings from Last 3 Encounters:  04/20/23 204 lb (92.5 kg)  04/01/23 200 lb 6.4 oz (90.9 kg)  03/24/23 203 lb 1.9 oz (92.1 kg)    Lab Results  Component Value Date   TSH 2.060 01/19/2023   Lab Results  Component Value Date   WBC 9.9 03/20/2023   HGB 14.0 03/20/2023   HCT 42.0 03/20/2023   MCV 83.0 03/20/2023   PLT 411 (H) 03/20/2023   Lab Results  Component Value Date   NA 135 03/20/2023   K 3.6 03/20/2023   CO2 22 03/20/2023   GLUCOSE 92 03/20/2023   BUN 13 03/20/2023   CREATININE 0.58 03/20/2023   BILITOT 0.9 03/20/2023   ALKPHOS 73 03/20/2023   AST 18 03/20/2023   ALT 21 03/20/2023   PROT 7.5 03/20/2023   ALBUMIN 4.2 03/20/2023   CALCIUM 8.7 (L) 03/20/2023   ANIONGAP 9 03/20/2023   EGFR 111 01/19/2023   Lab Results  Component Value Date   CHOL 189 01/19/2023   Lab Results  Component Value Date   HDL 44 01/19/2023   Lab Results  Component Value Date   LDLCALC 121 (H) 01/19/2023   Lab Results  Component Value Date   TRIG 133 01/19/2023   Lab Results  Component Value Date   CHOLHDL 4.3 01/19/2023   Lab Results  Component Value Date   HGBA1C 5.9 (H) 01/19/2023      Assessment & Plan:   Generalized anxiety disorder with panic attacks Assessment & Plan: GAD-7 is 6 She denies suicidal thoughts and ideation She takes lamictal 100 mg daily as a mood stabilizer, hydroxyzine 50- 100 mg twice daily as needed for anxiety, and BuSpar 10 mg twice daily She reports compliance with treatment regimen      Follow-up: Return in about 3 months (around 07/20/2023).   Gilmore Laroche, FNP

## 2023-05-30 DIAGNOSIS — R32 Unspecified urinary incontinence: Secondary | ICD-10-CM | POA: Diagnosis not present

## 2023-05-30 DIAGNOSIS — E119 Type 2 diabetes mellitus without complications: Secondary | ICD-10-CM | POA: Diagnosis not present

## 2023-06-04 ENCOUNTER — Other Ambulatory Visit (HOSPITAL_COMMUNITY): Payer: Self-pay | Admitting: Family Medicine

## 2023-06-04 DIAGNOSIS — Z1231 Encounter for screening mammogram for malignant neoplasm of breast: Secondary | ICD-10-CM

## 2023-06-10 ENCOUNTER — Ambulatory Visit (HOSPITAL_COMMUNITY)
Admission: RE | Admit: 2023-06-10 | Discharge: 2023-06-10 | Disposition: A | Discharge status: Home / Self Care | Payer: Medicaid Other | Source: Ambulatory Visit | Attending: Family Medicine | Admitting: Family Medicine

## 2023-06-10 DIAGNOSIS — Z1231 Encounter for screening mammogram for malignant neoplasm of breast: Secondary | ICD-10-CM | POA: Insufficient documentation

## 2023-06-29 DIAGNOSIS — R32 Unspecified urinary incontinence: Secondary | ICD-10-CM | POA: Diagnosis not present

## 2023-06-29 DIAGNOSIS — E119 Type 2 diabetes mellitus without complications: Secondary | ICD-10-CM | POA: Diagnosis not present

## 2023-06-30 ENCOUNTER — Other Ambulatory Visit: Payer: Self-pay | Admitting: Family Medicine

## 2023-06-30 ENCOUNTER — Telehealth: Payer: Self-pay | Admitting: Family Medicine

## 2023-06-30 DIAGNOSIS — F5104 Psychophysiologic insomnia: Secondary | ICD-10-CM

## 2023-06-30 DIAGNOSIS — F410 Panic disorder [episodic paroxysmal anxiety] without agoraphobia: Secondary | ICD-10-CM

## 2023-06-30 MED ORDER — LAMOTRIGINE 100 MG PO TABS: 100.0000 mg | ORAL_TABLET | Freq: Every day | ORAL | 1 refills | Status: DC

## 2023-06-30 MED ORDER — BUSPIRONE HCL 10 MG PO TABS: 10.0000 mg | ORAL_TABLET | Freq: Two times a day (BID) | ORAL | 1 refills | Status: DC

## 2023-06-30 NOTE — Telephone Encounter (Signed)
Refill sent.

## 2023-06-30 NOTE — Telephone Encounter (Signed)
Tried to call pt back, vm is full.

## 2023-06-30 NOTE — Telephone Encounter (Signed)
Patient called said this medicine was denied and only has 1 pill left  hydrOXYzine (ATARAX) 50 MG tablet [[id number]]   Pharmacy: [facility] [address]

## 2023-07-20 ENCOUNTER — Ambulatory Visit: Payer: Medicaid Other | Admitting: Family Medicine

## 2023-07-24 ENCOUNTER — Ambulatory Visit
Admission: RE | Admit: 2023-07-24 | Discharge: 2023-07-24 | Disposition: A | Discharge status: Home / Self Care | Payer: Medicaid Other | Source: Ambulatory Visit | Attending: Family Medicine | Admitting: Family Medicine

## 2023-07-24 VITALS — BP 147/83 | HR 115 | Temp 99.8°F | Resp 18

## 2023-07-24 DIAGNOSIS — R059 Cough, unspecified: Secondary | ICD-10-CM | POA: Diagnosis present

## 2023-07-24 DIAGNOSIS — J069 Acute upper respiratory infection, unspecified: Secondary | ICD-10-CM | POA: Insufficient documentation

## 2023-07-24 DIAGNOSIS — Z1152 Encounter for screening for COVID-19: Secondary | ICD-10-CM | POA: Insufficient documentation

## 2023-07-24 DIAGNOSIS — F17210 Nicotine dependence, cigarettes, uncomplicated: Secondary | ICD-10-CM | POA: Insufficient documentation

## 2023-07-24 DIAGNOSIS — J029 Acute pharyngitis, unspecified: Secondary | ICD-10-CM | POA: Diagnosis present

## 2023-07-24 MED ORDER — PROMETHAZINE-DM 6.25-15 MG/5ML PO SYRP: 5.0000 mL | ORAL_SOLUTION | Freq: Four times a day (QID) | ORAL | 0 refills | Status: DC | PRN

## 2023-07-24 NOTE — ED Triage Notes (Signed)
Pt reports cold chills, body aches, chest soreness, and sore throat x 1 day.   Took dual action tylenol and advil

## 2023-07-24 NOTE — ED Provider Notes (Signed)
[facility]    CSN: [id number] Arrival date & time: 07/24/23  1640      History   Chief Complaint Chief Complaint  Patient presents with   Chills    Chills fever cough - Entered by patient    HPI Jacqueline [name] is a 47 y.o. female.   Patient presenting today with 1 day history of fever, chills, body aches, cough, sore throat, fatigue, headaches.  Denies chest pain, shortness of breath, abdominal pain, nausea vomiting or diarrhea.  So far trying Tylenol and ibuprofen with minimal relief.  No known sick contacts recently.   Past Medical History:  Diagnosis Date   Annual visit for general adult medical examination with abnormal findings 02/22/2020   Anxiety    Depression, major, single episode, moderate (HCC) 12/29/2019   Encounter for TB tine test 02/22/2020   GERD (gastroesophageal reflux disease)    Kidney stones    hx UTI   Need for immunization against influenza 12/29/2019   Sinusitis    URI (upper respiratory infection)    UTI (lower urinary tract infection)    Vaginal Pap smear, abnormal     Patient Active Problem List   Diagnosis Date Noted   High grade squamous intraepithelial lesion (HGSIL), grade 3 CIN, 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   Chronic foot pain, right 03/06/2023   Chronic sinusitis 03/06/2023   HGSIL on Pap smear of cervix 02/12/2023   Visit for routine gyn exam 02/06/2023   PTSD (post-traumatic stress disorder) 10/23/2022   Binge-eating disorder, 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   Perimenopausal vasomotor symptoms 10/03/2022   Elevated blood pressure reading 08/12/2022   Low back pain 06/19/2022   Mixed hyperlipidemia 02/11/2022   Polycythemia 02/11/2022   Screening mammogram for breast cancer 02/11/2022   Annual physical exam 11/18/2021   Rash and nonspecific skin eruption 11/18/2021   Sexuality issues 05/09/2021   Sciatica of right side 08/08/2020   Vitamin D deficiency 03/20/2020   Fatigue 02/22/2020   Insomnia 01/25/2020   Generalized  anxiety disorder with panic attacks 12/29/2019   Left breast mass 12/29/2019   Obesity (BMI 30-39.9) 12/29/2019   Nicotine abuse 12/29/2019   Prehypertension 12/29/2019   Past Surgical History:  Procedure Laterality Date   DILATION AND CURETTAGE OF UTERUS     LEEP  03/24/2023   LEEP N/A 03/24/2023   Procedure: LOOP ELECTROSURGICAL EXCISION PROCEDURE (LEEP);  Surgeon: Myna Hidalgo, DO;  Location: [facility];  Service: Gynecology;  Laterality: N/A;   TUBAL LIGATION      OB History     Gravida  3   Para  3   Term  3   Preterm      AB      Living  3      SAB      IAB      Ectopic      Multiple      Live Births             Home Medications    Prior to Admission medications   Medication Sig Start Date End Date Taking? Authorizing Provider  promethazine-dextromethorphan (PROMETHAZINE-DM) 6.25-15 MG/5ML syrup Take 5 mLs by mouth 4 (four) times daily as needed. 07/24/23  Yes Particia Nearing, PA-C  acetaminophen (TYLENOL) 325 MG tablet Take 2 tablets (650 mg total) by mouth every 6 (six) hours as needed. 03/24/23   Myna Hidalgo, DO  azelastine (ASTELIN) 0.1 % nasal spray Place 2 sprays into both nostrils 2 (two) times daily. Use  in each nostril as directed Patient taking differently: Place 2 sprays into both nostrils 2 (two) times daily as needed for allergies. Use in each nostril as directed 11/17/22   Gilmore Laroche, FNP  busPIRone (BUSPAR) 10 MG tablet Take 1 tablet (10 mg total) by mouth 2 (two) times daily. 06/30/23   Gilmore Laroche, FNP  gabapentin (NEURONTIN) 300 MG capsule Take 300 mg by mouth 2 (two) times daily. Can take a 300 mg throughout the day as needed for anxiety Patient not taking: Reported on 04/20/2023 06/04/22   [provider]  hydrOXYzine (ATARAX) 50 MG tablet TAKE 1 TO 2 TABLETS(50 TO 100 MG) BY MOUTH TWICE DAILY AS NEEDED FOR ANXIETY 06/30/23   Gilmore Laroche, FNP  lamoTRIgine (LAMICTAL) 100 MG tablet Take 1 tablet (100 mg total) by  mouth daily. 06/30/23   Gilmore Laroche, FNP  sodium chloride (OCEAN) 0.65 % SOLN nasal spray Place 1 spray into both nostrils as needed for congestion. 03/14/22   Paseda, Baird Kay, FNP  Vitamin D, Ergocalciferol, (DRISDOL) 1.25 MG (50000 UNIT) CAPS capsule Take 1 capsule (50,000 Units total) by mouth every 7 (seven) days. 01/22/23   Gilmore Laroche, FNP    Family History Family History  Problem Relation Age of Onset   Diabetes Other    Heart disease Mother    Diabetes Maternal Aunt    Cancer Paternal Aunt        lymph node, breast, and ovarian   Breast cancer Paternal Aunt    Diabetes Maternal Grandmother    Heart disease Maternal Grandmother    Heart failure Maternal Grandmother    Heart attack Maternal Grandfather    Cancer Paternal Grandmother        breast cancer   Breast cancer Paternal Grandmother    Cancer Paternal Grandfather        lung cancer    Social History Social History   Tobacco Use   Smoking status: Every Day    Current packs/day: 0.00    Average packs/day: 0.5 packs/day for 5.0 years (2.5 ttl pk-yrs)    Types: Cigarettes    Start date: 05/08/2005    Last attempt to quit: 05/08/2010    Years since quitting: 13.2   Smokeless tobacco: Never  Vaping Use   Vaping status: Never Used  Substance Use Topics   Alcohol use: No   Drug use: No     Allergies   Penicillins   Review of Systems Review of Systems Per HPI  Physical Exam Triage Vital Signs ED Triage Vitals  Encounter Vitals Group     BP 07/24/23 1648 (!) 147/83     Systolic BP Percentile --      Diastolic BP Percentile --      Pulse Rate 07/24/23 1648 (!) 115     Resp 07/24/23 1648 18     Temp 07/24/23 1648 99.8 F (37.7 C)     Temp Source 07/24/23 1648 Oral     SpO2 07/24/23 1648 96 %     Weight --      Height --      Head Circumference --      Peak Flow --      Pain Score 07/24/23 1649 3     Pain Loc --      Pain Education --      Exclude from Growth Chart --    No data  found.  Updated Vital Signs BP (!) 147/83 (BP Location: Right Arm)   Pulse (!) 115  Temp 99.8 F (37.7 C) (Oral)   Resp 18   LMP 07/24/2022   SpO2 96%   Visual Acuity Right Eye Distance:   Left Eye Distance:   Bilateral Distance:    Right Eye Near:   Left Eye Near:    Bilateral Near:     Physical Exam Vitals and nursing note reviewed.  Constitutional:      Appearance: Normal appearance. She is not ill-appearing.  HENT:     Head: Atraumatic.     Nose: Rhinorrhea present.     Mouth/Throat:     Pharynx: Posterior oropharyngeal erythema present.  Eyes:     Extraocular Movements: Extraocular movements intact.     Conjunctiva/sclera: Conjunctivae normal.  Cardiovascular:     Rate and Rhythm: Normal rate and regular rhythm.     Heart sounds: Normal heart sounds.  Pulmonary:     Effort: Pulmonary effort is normal.     Breath sounds: Normal breath sounds.  Musculoskeletal:        General: Normal range of motion.     Cervical back: Normal range of motion and neck supple.  Skin:    General: Skin is warm and dry.  Neurological:     Mental Status: She is alert and oriented to person, place, and time.  Psychiatric:        Mood and Affect: Mood normal.        Thought Content: Thought content normal.        Judgment: Judgment normal.      UC Treatments / Results  Labs (all labs ordered are listed, but only abnormal results are displayed) Labs Reviewed  SARS CORONAVIRUS 2 (TAT 6-24 HRS)    EKG   Radiology No results found.  Procedures Procedures (including critical care time)  Medications Ordered in UC Medications - No data to display  Initial Impression / Assessment and Plan / UC Course  I have reviewed the triage vital signs and the nursing notes.  Pertinent labs & imaging results that were available during my care of the patient were reviewed by me and considered in my medical decision making (see chart for details).     Mildly tachycardic and  hypertensive in triage, otherwise vital signs reassuring.  Suspect viral upper respiratory infection.  COVID testing pending, good candidate for Paxlovid if positive.  Treat with Phenergan DM, supportive over-the-counter medications and home care.  Return for worsening symptoms.  Final Clinical Impressions(s) / UC Diagnoses   Final diagnoses:  Viral URI with cough   Discharge Instructions   None    ED Prescriptions     Medication Sig Dispense Auth. Provider   promethazine-dextromethorphan (PROMETHAZINE-DM) 6.25-15 MG/5ML syrup Take 5 mLs by mouth 4 (four) times daily as needed. 100 mL Particia Nearing, [place]      PDMP not reviewed this encounter.   Particia Nearing, [place] 07/24/23 1850

## 2023-07-25 ENCOUNTER — Other Ambulatory Visit: Payer: Self-pay | Admitting: Family Medicine

## 2023-07-27 NOTE — Telephone Encounter (Signed)
Urgent care patient Requested Prescriptions  Pending Prescriptions Disp Refills   promethazine-dextromethorphan (PROMETHAZINE-DM) 6.25-15 MG/5ML syrup [Pharmacy Med Name: PROMETHAZINE DM SYRUP] 100 mL 0    Sig: TAKE 5 ML BY MOUTH FOUR TIMES DAILY AS NEEDED     There is no refill protocol information for this order

## 2023-07-29 ENCOUNTER — Encounter: Payer: Self-pay | Admitting: Family Medicine

## 2023-07-29 ENCOUNTER — Ambulatory Visit (INDEPENDENT_AMBULATORY_CARE_PROVIDER_SITE_OTHER): Payer: Medicaid Other | Admitting: Family Medicine

## 2023-07-29 VITALS — BP 138/86 | HR 111 | Ht 60.0 in | Wt 211.1 lb

## 2023-07-29 DIAGNOSIS — E669 Obesity, unspecified: Secondary | ICD-10-CM | POA: Diagnosis not present

## 2023-07-29 DIAGNOSIS — E038 Other specified hypothyroidism: Secondary | ICD-10-CM

## 2023-07-29 DIAGNOSIS — E559 Vitamin D deficiency, unspecified: Secondary | ICD-10-CM | POA: Diagnosis not present

## 2023-07-29 DIAGNOSIS — Z1211 Encounter for screening for malignant neoplasm of colon: Secondary | ICD-10-CM

## 2023-07-29 DIAGNOSIS — E7849 Other hyperlipidemia: Secondary | ICD-10-CM | POA: Diagnosis not present

## 2023-07-29 DIAGNOSIS — B349 Viral infection, unspecified: Secondary | ICD-10-CM | POA: Diagnosis not present

## 2023-07-29 DIAGNOSIS — R7301 Impaired fasting glucose: Secondary | ICD-10-CM

## 2023-07-29 MED ORDER — PREDNISONE 20 MG PO TABS: 40.0000 mg | ORAL_TABLET | Freq: Every day | ORAL | 0 refills | Status: AC

## 2023-07-29 NOTE — Progress Notes (Signed)
Established Patient Office Visit  Subjective:  Patient ID: Jacqueline [name], female    DOB: [birth year]  Age: 47 y.o. MRN: [id number]  CC:  Chief Complaint  Patient presents with   Care Management    3 month f/u, deep lingering cough, pt would like to discuss colonoscopy options that are better for her.    Obesity    Has a personal trainer pt reports no change with her weight    HPI Jacqueline [name] is a 47 y.o. female with past medical history of obesity, mixed hyperlipidemia presents for f/u of  chronic medical conditions. For the details of today's visit, please refer to the assessment and plan.     Past Medical History:  Diagnosis Date   Annual visit for general adult medical examination with abnormal findings 02/22/2020   Anxiety    Depression, major, single episode, moderate (HCC) 12/29/2019   Encounter for TB tine test 02/22/2020   GERD (gastroesophageal reflux disease)    Kidney stones    hx UTI   Need for immunization against influenza 12/29/2019   Sinusitis    URI (upper respiratory infection)    UTI (lower urinary tract infection)    Vaginal Pap smear, abnormal     Past Surgical History:  Procedure Laterality Date   DILATION AND CURETTAGE OF UTERUS     LEEP  03/24/2023   LEEP N/A 03/24/2023   Procedure: LOOP ELECTROSURGICAL EXCISION PROCEDURE (LEEP);  Surgeon: Myna Hidalgo, DO;  Location: [facility];  Service: Gynecology;  Laterality: N/A;   TUBAL LIGATION      Family History  Problem Relation Age of Onset   Diabetes Other    Heart disease Mother    Diabetes Maternal Aunt    Cancer Paternal Aunt        lymph node, breast, and ovarian   Breast cancer Paternal Aunt    Diabetes Maternal Grandmother    Heart disease Maternal Grandmother    Heart failure Maternal Grandmother    Heart attack Maternal Grandfather    Cancer Paternal Grandmother        breast cancer   Breast cancer Paternal Grandmother    Cancer Paternal Grandfather        lung cancer    Social  History   Socioeconomic History   Marital status: Single    Spouse name: Not on file   Number of children: 1   Years of education: Not on file   Highest education level: Associate degree: academic program  Occupational History   Not on file  Tobacco Use   Smoking status: Every Day    Current packs/day: 0.00    Average packs/day: 0.5 packs/day for 5.0 years (2.5 ttl pk-yrs)    Types: Cigarettes    Start date: 05/08/2005    Last attempt to quit: 05/08/2010    Years since quitting: 13.2   Smokeless tobacco: Never  Vaping Use   Vaping status: Never Used  Substance and Sexual Activity   Alcohol use: No   Drug use: No   Sexual activity: Not Currently    Birth control/protection: Surgical, Post-menopausal    Comment: tubal  Other Topics Concern   Not on file  Social History Narrative   Lives with son Marlene Bast is 23 years old.   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    Social Determinants of Health   Financial Resource Strain: Medium Risk (02/06/2023)   Overall Financial Resource Strain (CARDIA)    Difficulty of Paying Living Expenses: Somewhat hard  Food Insecurity: No Food Insecurity (04/20/2023)   Hunger Vital Sign    Worried About Running Out of Food in the Last Year: Never true    Ran Out of Food in the Last Year: Never true  Transportation Needs: No Transportation Needs (04/20/2023)   PRAPARE - Administrator, Civil Service (Medical): No    Lack of Transportation (Non-Medical): No  Physical Activity: Sufficiently Active (04/20/2023)   Exercise Vital Sign    Days of Exercise per Week: 3 days    Minutes of Exercise per Session: 60 min  Stress: Stress Concern Present (04/20/2023)   [employer] of  Occupational Health - Occupational Stress Questionnaire    Feeling of Stress : To some extent  Social Connections: Moderately Integrated (04/20/2023)   Social Connection and Isolation Panel [NHANES]    Frequency of Communication with Friends and Family: More than three times a week    Frequency of Social Gatherings with Friends and Family: More than three times a week    Attends Religious Services: More than 4 times per year    Active Member of [employer] or Organizations: Yes    Attends Engineer, structural: More than 4 times per year    Marital Status: Divorced  Intimate Partner Violence: Not At Risk (02/06/2023)   Humiliation, Afraid, Rape, and Kick questionnaire    Fear of Current or Ex-Partner: No    Emotionally Abused: No    Physically Abused: No    Sexually Abused: No    Outpatient Medications Prior to Visit  Medication Sig Dispense Refill   acetaminophen (TYLENOL) 325 MG tablet Take 2 tablets (650 mg total) by mouth every 6 (six) hours as needed.     azelastine (ASTELIN) 0.1 % nasal spray Place 2 sprays into both nostrils 2 (two) times daily. Use in each nostril as directed (Patient taking differently: Place 2 sprays into both nostrils 2 (two) times daily as needed for allergies. Use in each nostril as directed) 30 mL 12   busPIRone (BUSPAR) 10 MG tablet Take 1 tablet (10 mg total) by mouth 2 (two) times daily. 90 tablet 1   gabapentin (NEURONTIN) 300 MG capsule Take 300 mg by mouth 2 (two) times daily. Can take a 300 mg throughout the day as needed for anxiety     hydrOXYzine (ATARAX) 50 MG tablet TAKE 1 TO 2 TABLETS(50 TO 100 MG) BY MOUTH TWICE DAILY AS NEEDED FOR ANXIETY 30 tablet 1   lamoTRIgine (LAMICTAL) 100 MG tablet Take 1 tablet (100 mg total) by mouth daily. 30 tablet 1   promethazine-dextromethorphan (PROMETHAZINE-DM) 6.25-15 MG/5ML syrup Take 5 mLs by mouth 4 (four) times daily as needed. 100 mL 0   sodium chloride (OCEAN) 0.65 % SOLN nasal spray Place 1 spray into  both nostrils as needed for congestion. 15 mL 0   Vitamin D, Ergocalciferol, (DRISDOL) 1.25 MG (50000 UNIT) CAPS capsule Take 1 capsule (50,000 Units total) by mouth every 7 (seven) days. (Patient not taking: Reported on 07/29/2023) 10 capsule 1   No facility-administered medications prior to visit.  Allergies  Allergen Reactions   Penicillins Nausea And Vomiting and Rash    ROS Review of Systems  Constitutional:  Negative for chills and fever.  HENT:  Positive for congestion (chest).   Eyes:  Negative for visual disturbance.  Respiratory:  Negative for chest tightness and shortness of breath.   Neurological:  Negative for dizziness and headaches.      Objective:    Physical Exam Constitutional:      Appearance: She is obese.  HENT:     Head: Normocephalic.     Mouth/Throat:     Mouth: Mucous membranes are moist.  Cardiovascular:     Rate and Rhythm: Normal rate.     Heart sounds: Normal heart sounds.  Pulmonary:     Effort: Pulmonary effort is normal.     Breath sounds: Normal breath sounds.  Neurological:     Mental Status: She is alert.     BP 138/86 (BP Location: Left Arm)   Pulse (!) 111   Ht 5' (1.524 m)   Wt 211 lb 1.9 oz (95.8 kg)   LMP 07/24/2022   SpO2 94%   BMI 41.23 kg/m  Wt Readings from Last 3 Encounters:  07/29/23 211 lb 1.9 oz (95.8 kg)  04/20/23 204 lb (92.5 kg)  04/01/23 200 lb 6.4 oz (90.9 kg)    Lab Results  Component Value Date   TSH 2.060 01/19/2023   Lab Results  Component Value Date   WBC 9.9 03/20/2023   HGB 14.0 03/20/2023   HCT 42.0 03/20/2023   MCV 83.0 03/20/2023   PLT 411 (H) 03/20/2023   Lab Results  Component Value Date   NA 135 03/20/2023   K 3.6 03/20/2023   CO2 22 03/20/2023   GLUCOSE 92 03/20/2023   BUN 13 03/20/2023   CREATININE 0.58 03/20/2023   BILITOT 0.9 03/20/2023   ALKPHOS 73 03/20/2023   AST 18 03/20/2023   ALT 21 03/20/2023   PROT 7.5 03/20/2023   ALBUMIN 4.2 03/20/2023   CALCIUM 8.7 (L)  03/20/2023   ANIONGAP 9 03/20/2023   EGFR 111 01/19/2023   Lab Results  Component Value Date   CHOL 189 01/19/2023   Lab Results  Component Value Date   HDL 44 01/19/2023   Lab Results  Component Value Date   LDLCALC 121 (H) 01/19/2023   Lab Results  Component Value Date   TRIG 133 01/19/2023   Lab Results  Component Value Date   CHOLHDL 4.3 01/19/2023   Lab Results  Component Value Date   HGBA1C 5.9 (H) 01/19/2023      Assessment & Plan:  Obesity (BMI 30-39.9) Assessment & Plan: Reports implement lifestyle changes with minimal relief for the past 3 months We will start the patient on Wegovy today Provided a sample for wegovy 0.25 mg weekly She will request a refill monthly with dose titration No reported family/personal history of medullary thyroid carcinoma Encouraged to continue a heart healthy diet with increase physical activity while on therapy Wt Readings from Last 3 Encounters:  07/29/23 211 lb 1.9 oz (95.8 kg)  04/20/23 204 lb (92.5 kg)  04/01/23 200 lb 6.4 oz (90.9 kg)      Viral illness Assessment & Plan: C/o of chest congestion No fever, chills or body aches reported She has been taking promethazine DM with minimum relief Will treat with prednisone 40 mg daily for 5 days Encouraged to not take NSAIDs product while on treatment regimen Take medication as prescribed. Increase fluids and  allow for plenty of rest. Recommend Tylenol as needed for pain, fever, or general discomfort. Warm salt water gargles 3-4 times daily to help with throat pain or discomfort. Recommend using a humidifier at bedtime during sleep to help with cough and nasal congestion. Follow-up if your symptoms do not improve    Orders: -     predniSONE; Take 2 tablets (40 mg total) by mouth daily with breakfast for 5 days.  Dispense: 10 tablet; Refill: 0  IFG (impaired fasting glucose) -     Hemoglobin A1c  Vitamin D deficiency -     VITAMIN D 25 Hydroxy (Vit-D Deficiency,  Fractures)  Other specified hypothyroidism -     TSH + free T4  Other hyperlipidemia -     Lipid panel -     CMP14+EGFR -     CBC with Differential/Platelet  Colon cancer screening -     Cologuard  Note: This chart has been completed using Engineer, civil (consulting) software, and while attempts have been made to ensure accuracy, certain words and phrases may not be transcribed as intended.    Follow-up: Return in about 3 months (around 10/29/2023).   Gilmore Laroche, FNP

## 2023-07-29 NOTE — Patient Instructions (Addendum)
I appreciate the opportunity to provide care to you today!    Follow up:  3 months  Labs: please stop by the lab during the week to get your blood drawn (CBC, CMP, TSH, Lipid profile, HgA1c, Vit D)   Please continue to:  Increase your intake of foods rich in fruits, vegetables, whole grains Lean proteins: chicken, fish, beans, legumes Low Fat dairy products Reduced intake of saturated fats, trans fatty acids, cholesterol Aim to be active at least 5 days a week for 30 minutes each day  Attached with your AVS, you will find valuable resources for self-education. I highly recommend dedicating some time to thoroughly examine them.    Please continue to a heart-healthy diet and increase your physical activities. Try to exercise for at least five days a week.    It was a pleasure to see you and I look forward to continuing to work together on your health and well-being. Please do not hesitate to call the office if you need care or have questions about your care.  In case of emergency, please visit the Emergency Department for urgent care, or contact our clinic at [phone] to schedule an appointment. We're here to help you!   Have a wonderful day and week. With Gratitude, Gilmore Laroche MSN, FNP-BC

## 2023-07-31 DIAGNOSIS — B349 Viral infection, unspecified: Secondary | ICD-10-CM | POA: Insufficient documentation

## 2023-07-31 NOTE — Assessment & Plan Note (Addendum)
C/o of chest congestion No fever, chills or body aches reported She has been taking promethazine DM with minimum relief Will treat with prednisone 40 mg daily for 5 days Encouraged to not take NSAIDs product while on treatment regimen Take medication as prescribed. Increase fluids and allow for plenty of rest. Recommend Tylenol as needed for pain, fever, or general discomfort. Warm salt water gargles 3-4 times daily to help with throat pain or discomfort. Recommend using a humidifier at bedtime during sleep to help with cough and nasal congestion. Follow-up if your symptoms do not improve

## 2023-07-31 NOTE — Assessment & Plan Note (Signed)
Reports implement lifestyle changes with minimal relief for the past 3 months We will start the patient on Wegovy today Provided a sample for wegovy 0.25 mg weekly She will request a refill monthly with dose titration No reported family/personal history of medullary thyroid carcinoma Encouraged to continue a heart healthy diet with increase physical activity while on therapy Wt Readings from Last 3 Encounters:  07/29/23 211 lb 1.9 oz (95.8 kg)  04/20/23 204 lb (92.5 kg)  04/01/23 200 lb 6.4 oz (90.9 kg)

## 2023-08-04 DIAGNOSIS — E559 Vitamin D deficiency, unspecified: Secondary | ICD-10-CM | POA: Diagnosis not present

## 2023-08-04 DIAGNOSIS — E038 Other specified hypothyroidism: Secondary | ICD-10-CM | POA: Diagnosis not present

## 2023-08-04 DIAGNOSIS — R7301 Impaired fasting glucose: Secondary | ICD-10-CM | POA: Diagnosis not present

## 2023-08-04 DIAGNOSIS — E7849 Other hyperlipidemia: Secondary | ICD-10-CM | POA: Diagnosis not present

## 2023-08-05 ENCOUNTER — Encounter: Payer: Self-pay | Admitting: Family Medicine

## 2023-08-05 ENCOUNTER — Other Ambulatory Visit: Payer: Self-pay | Admitting: Family Medicine

## 2023-08-05 DIAGNOSIS — E559 Vitamin D deficiency, unspecified: Secondary | ICD-10-CM

## 2023-08-05 MED ORDER — VITAMIN D (ERGOCALCIFEROL) 1.25 MG (50000 UNIT) PO CAPS: 50000.0000 [IU] | ORAL_CAPSULE | ORAL | 1 refills | Status: DC

## 2023-08-05 NOTE — Progress Notes (Unsigned)
The 10-year ASCVD risk score (Arnett DK, et al., 2019) is: 3.8%   Values used to calculate the score:     Age: 47 years     Sex: Female     Is Non-Hispanic African American: No     Diabetic: No     Tobacco smoker: Yes     Systolic Blood Pressure: 138 mmHg     Is BP treated: No     HDL Cholesterol: 46 mg/dL     Total Cholesterol: 188 mg/dL

## 2023-08-06 ENCOUNTER — Other Ambulatory Visit: Payer: Self-pay | Admitting: Family Medicine

## 2023-08-06 ENCOUNTER — Encounter: Payer: Self-pay | Admitting: Family Medicine

## 2023-08-06 DIAGNOSIS — E1165 Type 2 diabetes mellitus with hyperglycemia: Secondary | ICD-10-CM

## 2023-08-06 MED ORDER — METFORMIN HCL 500 MG PO TABS: 500.0000 mg | ORAL_TABLET | Freq: Every day | ORAL | 3 refills | Status: AC

## 2023-08-07 ENCOUNTER — Other Ambulatory Visit: Payer: Self-pay | Admitting: Family Medicine

## 2023-08-07 DIAGNOSIS — F410 Panic disorder [episodic paroxysmal anxiety] without agoraphobia: Secondary | ICD-10-CM

## 2023-08-26 ENCOUNTER — Other Ambulatory Visit: Payer: Self-pay | Admitting: Family Medicine

## 2023-08-26 DIAGNOSIS — F5104 Psychophysiologic insomnia: Secondary | ICD-10-CM

## 2023-08-26 DIAGNOSIS — F410 Panic disorder [episodic paroxysmal anxiety] without agoraphobia: Secondary | ICD-10-CM

## 2023-09-03 ENCOUNTER — Other Ambulatory Visit: Payer: Self-pay | Admitting: Family Medicine

## 2023-09-03 ENCOUNTER — Encounter: Payer: Self-pay | Admitting: Family Medicine

## 2023-09-03 DIAGNOSIS — E669 Obesity, unspecified: Secondary | ICD-10-CM

## 2023-09-03 MED ORDER — SEMAGLUTIDE-WEIGHT MANAGEMENT 0.5 MG/0.5ML ~~LOC~~ SOAJ: 0.5000 mg | SUBCUTANEOUS | 0 refills | Status: DC

## 2023-09-07 ENCOUNTER — Encounter: Payer: Self-pay | Admitting: Family Medicine

## 2023-09-08 ENCOUNTER — Other Ambulatory Visit: Payer: Self-pay | Admitting: Family Medicine

## 2023-09-08 ENCOUNTER — Other Ambulatory Visit: Payer: Self-pay

## 2023-09-08 ENCOUNTER — Encounter: Payer: Self-pay | Admitting: Family Medicine

## 2023-09-08 DIAGNOSIS — E669 Obesity, unspecified: Secondary | ICD-10-CM

## 2023-09-08 MED ORDER — SEMAGLUTIDE-WEIGHT MANAGEMENT 0.5 MG/0.5ML ~~LOC~~ SOAJ
0.5000 mg | SUBCUTANEOUS | 0 refills | Status: DC
  Filled 2023-09-08: qty 2, 28d supply, fill #0

## 2023-09-10 ENCOUNTER — Other Ambulatory Visit: Payer: Self-pay

## 2023-09-15 ENCOUNTER — Other Ambulatory Visit: Payer: Self-pay

## 2023-09-26 ENCOUNTER — Other Ambulatory Visit: Payer: Self-pay | Admitting: Family Medicine

## 2023-09-26 DIAGNOSIS — F410 Panic disorder [episodic paroxysmal anxiety] without agoraphobia: Secondary | ICD-10-CM

## 2023-09-27 ENCOUNTER — Encounter: Payer: Self-pay | Admitting: Obstetrics & Gynecology

## 2023-09-27 DIAGNOSIS — R32 Unspecified urinary incontinence: Secondary | ICD-10-CM | POA: Diagnosis not present

## 2023-09-27 DIAGNOSIS — E119 Type 2 diabetes mellitus without complications: Secondary | ICD-10-CM | POA: Diagnosis not present

## 2023-09-28 ENCOUNTER — Encounter: Payer: Self-pay | Admitting: Family Medicine

## 2023-09-28 ENCOUNTER — Other Ambulatory Visit: Payer: Self-pay

## 2023-09-28 DIAGNOSIS — F410 Panic disorder [episodic paroxysmal anxiety] without agoraphobia: Secondary | ICD-10-CM

## 2023-10-01 ENCOUNTER — Encounter: Payer: Self-pay | Admitting: Obstetrics & Gynecology

## 2023-10-01 ENCOUNTER — Other Ambulatory Visit (HOSPITAL_COMMUNITY)
Admission: RE | Admit: 2023-10-01 | Discharge: 2023-10-01 | Disposition: A | Discharge status: Home / Self Care | Payer: Medicaid Other | Source: Ambulatory Visit | Attending: Obstetrics & Gynecology | Admitting: Obstetrics & Gynecology

## 2023-10-01 ENCOUNTER — Ambulatory Visit (INDEPENDENT_AMBULATORY_CARE_PROVIDER_SITE_OTHER): Payer: Medicaid Other | Admitting: Obstetrics & Gynecology

## 2023-10-01 VITALS — BP 143/103 | HR 92 | Ht 63.75 in | Wt 208.0 lb

## 2023-10-01 DIAGNOSIS — Z9889 Other specified postprocedural states: Secondary | ICD-10-CM

## 2023-10-01 DIAGNOSIS — R4589 Other symptoms and signs involving emotional state: Secondary | ICD-10-CM

## 2023-10-01 DIAGNOSIS — Z87410 Personal history of cervical dysplasia: Secondary | ICD-10-CM | POA: Diagnosis not present

## 2023-10-01 NOTE — Progress Notes (Signed)
   GYN VISIT Patient name: Jacqueline [name] MRN [id number]  Date of birth: [birth year] Chief Complaint:   Gynecologic Exam  History of Present Illness:   TAHRA Jacqueline [name] is a 47 y.o. 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     She denies vaginal bleeding or discharge.  Thought maybe she saw slight pink spotting yesterday, but nothing further.  Denies itching or irritation.  Denies pelvic or abdominal pain.  She reports no other acute complaints  Patient's last menstrual period was 07/24/2022.    Review of Systems:   Pertinent items are noted in HPI Denies fever/chills, dizziness, headaches, visual disturbances, fatigue, shortness of breath, chest pain, abdominal pain, vomiting. Pertinent History Reviewed:   Past Surgical History:  Procedure Laterality Date   DILATION AND CURETTAGE OF UTERUS     LEEP  03/24/2023   LEEP N/A 03/24/2023   Procedure: LOOP ELECTROSURGICAL EXCISION PROCEDURE (LEEP);  Surgeon: Myna Hidalgo, DO;  Location: [facility];  Service: Gynecology;  Laterality: N/A;   TUBAL LIGATION      Past Medical History:  Diagnosis Date   Annual visit for general adult medical examination with abnormal findings 02/22/2020   Anxiety    Depression, major, single episode, moderate (HCC) 12/29/2019   Encounter for TB tine test 02/22/2020   GERD (gastroesophageal reflux disease)    Kidney stones    hx UTI   Need for immunization against influenza 12/29/2019   Sinusitis    URI (upper respiratory infection)    UTI (lower urinary tract infection)    Vaginal Pap smear, abnormal    Reviewed problem list, medications and allergies. Physical Assessment:   Vitals:   10/01/23 1558 10/01/23 1611  BP: (!) 148/101 (!) 143/103  Pulse: 93 92  Weight: 208 lb (94.3 kg)   Height: 5' 3.75" (1.619 m)   Body mass index is 35.98 kg/m.       Physical Examination:   General appearance: alert,  well appearing, and in no distress  Psych: mood appropriate, normal affect  Skin: warm & dry   Cardiovascular: normal heart rate noted  Respiratory: normal respiratory effort, no distress  Abdomen: soft, non-tender   Pelvic: VULVA: normal appearing vulva with no masses, tenderness or lesions, VAGINA: normal appearing vagina with normal color and discharge, no lesions, CERVIX: normal appearing cervix without discharge or lesions  Extremities: no edema   Chaperone: Faith Rogue    Assessment & Plan:  1) Cervical dysplasia -pap collected, per ASCCP guidelines -plan for f/u in 6 mos  2) ? spotting -Should she note any continued bleeding or spotting, patient to return to clinic  3) Elevated blood pressure due to anxiety about health -pt on medication and goes to therapy -reports that s/p tick bite, she has always had issues with this  No orders of the defined types were placed in this encounter.   Return in about 6 months (around 03/31/2024) for Annual.   Myna Hidalgo, DO Attending Obstetrician & Gynecologist, [facility] for [facility], [facility]

## 2023-10-02 ENCOUNTER — Encounter: Payer: Self-pay | Admitting: Family Medicine

## 2023-10-02 ENCOUNTER — Ambulatory Visit (INDEPENDENT_AMBULATORY_CARE_PROVIDER_SITE_OTHER): Payer: Medicaid Other | Admitting: Family Medicine

## 2023-10-02 VITALS — BP 122/84 | HR 89 | Ht 63.75 in | Wt 207.1 lb

## 2023-10-02 DIAGNOSIS — R109 Unspecified abdominal pain: Secondary | ICD-10-CM | POA: Diagnosis not present

## 2023-10-02 DIAGNOSIS — S39012A Strain of muscle, fascia and tendon of lower back, initial encounter: Secondary | ICD-10-CM

## 2023-10-02 DIAGNOSIS — T148XXA Other injury of unspecified body region, initial encounter: Secondary | ICD-10-CM

## 2023-10-02 MED ORDER — CYCLOBENZAPRINE HCL 5 MG PO TABS: 5.0000 mg | ORAL_TABLET | Freq: Three times a day (TID) | ORAL | 1 refills | Status: DC | PRN

## 2023-10-02 NOTE — Patient Instructions (Addendum)
I appreciate the opportunity to provide care to you today!    Follow up:  1 months  A muscle strain, or pulled muscle, happens when a muscle is stretched beyond its normal length.  A prescription for Flexeril 5 mg to take 3 times daily as needed has been sent to your pharmacy. Please be aware that this medication can cause drowsiness, so I recommend avoiding activities that require mental alertness while taking it. Ideally, you should take the medication at bedtime.  Additionally, continue using over-the-counter medications such as Tylenol and aspirin as needed for pain relief. Please adhere to the non-pharmacological interventions for managing the muscle strain in your right lower lumbar spine, which include rest, ice therapy, gentle stretching, and proper posture  Non-Pharmacological Interventions for Muscle Strain Rest:Allow the affected muscle to rest to promote healing. Avoid activities that exacerbate the strain. Ice Therapy:Apply ice packs to the affected area for 15-20 minutes every 1-2 hours during the first 48 hours post-injury. This helps reduce swelling and numb the pain. Heat Therapy:apply heat to the affected area (using a heating pad or warm towel) to help relax the muscles and improve blood flow. Use heat therapy for 15-20 minutes several times a day. Gentle Stretching:Once the initial pain and swelling have subsided, gentle stretching of the affected muscle can help improve flexibility and prevent stiffness. Strengthening Exercises:Gradually introduce strengthening exercises as the muscle begins to heal. Focus on low-impact exercises to build strength without further straining the muscle. Hydration and Nutrition:Maintain proper hydration and consume a balanced diet rich in vitamins and minerals to support muscle recovery. Foods high in protein, vitamin C, and magnesium can be particularly beneficial. Gradual Return to Activity:Once pain has decreased, gradually return to normal  activities, being careful to listen to your body and avoid overexertion.  Attached with your AVS, you will find valuable resources for self-education. I highly recommend dedicating some time to thoroughly examine them.   Please continue to a heart-healthy diet and increase your physical activities. Try to exercise for at least five days a week.    It was a pleasure to see you and I look forward to continuing to work together on your health and well-being. Please do not hesitate to call the office if you need care or have questions about your care.  In case of emergency, please visit the Emergency Department for urgent care, or contact our clinic at [phone] to schedule an appointment. We're here to help you!   Have a wonderful day and week. With Gratitude, Gilmore Laroche MSN, FNP-BC

## 2023-10-02 NOTE — Progress Notes (Unsigned)
Established Patient Office Visit  Subjective:  Patient ID: Jacqueline [name], female    DOB: 1976-12-12  Age: 47 y.o. MRN: [id number]  CC:  Chief Complaint  Patient presents with   Flank Pain    Right side pain. Concerns it could be medications. Ongoing for a week, worsened yesterday.     HPI Jacqueline [name] is a 47 y.o. female presents with  complains of right side pain  Right Side Pain: The patient denies urinary urgency, frequency, dysuria, or hematuria. She also reports no fever or chills. As a Midwife, she frequently bends and lifts. Symptom onset was a week ago, with worsening pain noted one day ago. She has been taking dual-action Tylenol and Motrin for pain relief. The affected site is tender to palpation with slight swelling. She describes the sensation as burning and achy. She denies bowel or bladder incontinence, as well as numbness or tingling radiating down her legs. There is no visible deformity of the lumbar spine.  Past Medical History:  Diagnosis Date   Annual visit for general adult medical examination with abnormal findings 02/22/2020   Anxiety    Depression, major, single episode, moderate (HCC) 12/29/2019   Encounter for TB tine test 02/22/2020   GERD (gastroesophageal reflux disease)    Kidney stones    hx UTI   Need for immunization against influenza 12/29/2019   Sinusitis    URI (upper respiratory infection)    UTI (lower urinary tract infection)    Vaginal Pap smear, abnormal     Past Surgical History:  Procedure Laterality Date   DILATION AND CURETTAGE OF UTERUS     LEEP  03/24/2023   LEEP N/A 03/24/2023   Procedure: LOOP ELECTROSURGICAL EXCISION PROCEDURE (LEEP);  Surgeon: Myna Hidalgo, DO;  Location: [facility];  Service: Gynecology;  Laterality: N/A;   TUBAL LIGATION      Family History  Problem Relation Age of Onset   Diabetes Other    Heart disease Mother    Diabetes Maternal Aunt    Cancer Paternal Aunt        lymph node, breast, and  ovarian   Breast cancer Paternal Aunt    Diabetes Maternal Grandmother    Heart disease Maternal Grandmother    Heart failure Maternal Grandmother    Heart attack Maternal Grandfather    Cancer Paternal Grandmother        breast cancer   Breast cancer Paternal Grandmother    Cancer Paternal Grandfather        lung cancer    Social History   Socioeconomic History   Marital status: Single    Spouse name: Not on file   Number of children: 1   Years of education: Not on file   Highest education level: Associate degree: academic program  Occupational History   Not on file  Tobacco Use   Smoking status: Every Day    Current packs/day: 0.00    Average packs/day: 0.5 packs/day for 5.0 years (2.5 ttl pk-yrs)    Types: Cigarettes    Start date: 05/08/2005    Last attempt to quit: 05/08/2010    Years since quitting: 13.4   Smokeless tobacco: Never  Vaping Use   Vaping status: Never Used  Substance and Sexual Activity   Alcohol use: No   Drug use: No   Sexual activity: Not Currently    Birth control/protection: Surgical, Post-menopausal    Comment: tubal  Other Topics Concern   Not on file  Social History Narrative   Lives with son Marlene Bast is 11 years old.   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    Social Determinants of Health   Financial Resource Strain: Medium Risk (02/06/2023)   Overall Financial Resource Strain (CARDIA)    Difficulty of Paying Living Expenses: Somewhat hard  Food Insecurity: No Food Insecurity (04/20/2023)   Hunger Vital Sign    Worried About Running Out of Food in the Last Year: Never true    Ran Out of Food in the Last Year: Never true  Transportation Needs: No Transportation Needs  (04/20/2023)   PRAPARE - Administrator, Civil Service (Medical): No    Lack of Transportation (Non-Medical): No  Physical Activity: Sufficiently Active (04/20/2023)   Exercise Vital Sign    Days of Exercise per Week: 3 days    Minutes of Exercise per Session: 60 min  Stress: Stress Concern Present (04/20/2023)   [employer] of Occupational Health - Occupational Stress Questionnaire    Feeling of Stress : To some extent  Social Connections: Moderately Integrated (04/20/2023)   Social Connection and Isolation Panel [NHANES]    Frequency of Communication with Friends and Family: More than three times a week    Frequency of Social Gatherings with Friends and Family: More than three times a week    Attends Religious Services: More than 4 times per year    Active Member of [employer] or Organizations: Yes    Attends Engineer, structural: More than 4 times per year    Marital Status: Divorced  Intimate Partner Violence: Not At Risk (02/06/2023)   Humiliation, Afraid, Rape, and Kick questionnaire    Fear of Current or Ex-Partner: No    Emotionally Abused: No    Physically Abused: No    Sexually Abused: No    Outpatient Medications Prior to Visit  Medication Sig Dispense Refill   acetaminophen (TYLENOL) 325 MG tablet Take 2 tablets (650 mg total) by mouth every 6 (six) hours as needed.     azelastine (ASTELIN) 0.1 % nasal spray Place 2 sprays into both nostrils 2 (two) times daily. Use in each nostril as directed (Patient taking differently: Place 2 sprays into both nostrils 2 (two) times daily as needed for allergies. Use in each nostril as directed) 30 mL 12   busPIRone (BUSPAR) 10 MG tablet Take 1 tablet (10 mg total) by mouth 2 (two) times daily. 90 tablet 1   gabapentin (NEURONTIN) 300 MG capsule Take 300 mg by mouth 2 (two) times daily. Can take a 300 mg throughout the day as needed for anxiety     hydrOXYzine (ATARAX) 50 MG tablet TAKE 1 TO 2 TABLET BY MOUTH TWICE DAILY  AS NEEDED FOR ANXIETY 30 tablet 1   lamoTRIgine (LAMICTAL) 100 MG tablet TAKE 1 TABLET(100 MG) BY MOUTH DAILY 30 tablet 1   metFORMIN (GLUCOPHAGE) 500 MG tablet Take 1 tablet (500 mg total) by mouth daily. 180 tablet 3   promethazine-dextromethorphan (PROMETHAZINE-DM) 6.25-15 MG/5ML syrup Take 5 mLs by mouth 4 (four) times daily as needed. 100 mL 0  Semaglutide-Weight Management 0.5 MG/0.5ML SOAJ Inject 0.5 mg into the skin once a week. 2 mL 0   sodium chloride (OCEAN) 0.65 % SOLN nasal spray Place 1 spray into both nostrils as needed for congestion. 15 mL 0   Vitamin D, Ergocalciferol, (DRISDOL) 1.25 MG (50000 UNIT) CAPS capsule Take 1 capsule (50,000 Units total) by mouth every 7 (seven) days. 20 capsule 1   No facility-administered medications prior to visit.    Allergies  Allergen Reactions   Penicillins Nausea And Vomiting and Rash    ROS Review of Systems  Constitutional:  Negative for chills and fever.  Eyes:  Negative for visual disturbance.  Respiratory:  Negative for chest tightness and shortness of breath.   Musculoskeletal:        Right side pain  Neurological:  Negative for dizziness and headaches.      Objective:    Physical Exam HENT:     Head: Normocephalic.     Mouth/Throat:     Mouth: Mucous membranes are moist.  Cardiovascular:     Rate and Rhythm: Normal rate.     Heart sounds: Normal heart sounds.  Pulmonary:     Effort: Pulmonary effort is normal.     Breath sounds: Normal breath sounds.  Musculoskeletal:     Lumbar back: No deformity. Negative right straight leg raise test and negative left straight leg raise test.  Neurological:     Mental Status: She is alert.     BP 122/84   Pulse 89   Ht 5' 3.75" (1.619 m)   Wt 207 lb 1.9 oz (93.9 kg)   LMP 07/24/2022   SpO2 94%   BMI 35.83 kg/m  Wt Readings from Last 3 Encounters:  10/02/23 207 lb 1.9 oz (93.9 kg)  10/01/23 208 lb (94.3 kg)  07/29/23 211 lb 1.9 oz (95.8 kg)    Lab Results   Component Value Date   TSH 1.930 08/04/2023   Lab Results  Component Value Date   WBC 15.9 (H) 08/04/2023   HGB 14.5 08/04/2023   HCT 42.7 08/04/2023   MCV 82 08/04/2023   PLT 536 (H) 08/04/2023   Lab Results  Component Value Date   NA 142 08/04/2023   K 3.9 08/04/2023   CO2 23 08/04/2023   GLUCOSE 80 08/04/2023   BUN 13 08/04/2023   CREATININE 0.70 08/04/2023   BILITOT 0.3 08/04/2023   ALKPHOS 103 08/04/2023   AST 9 08/04/2023   ALT 12 08/04/2023   PROT 7.0 08/04/2023   ALBUMIN 4.3 08/04/2023   CALCIUM 9.2 08/04/2023   ANIONGAP 9 03/20/2023   EGFR 109 08/04/2023   Lab Results  Component Value Date   CHOL 188 08/04/2023   Lab Results  Component Value Date   HDL 46 08/04/2023   Lab Results  Component Value Date   LDLCALC 125 (H) 08/04/2023   Lab Results  Component Value Date   TRIG 94 08/04/2023   Lab Results  Component Value Date   CHOLHDL 4.1 08/04/2023   Lab Results  Component Value Date   HGBA1C 6.4 (H) 08/04/2023      Assessment & Plan:  Muscle strain Assessment & Plan: A prescription for Flexeril 5 mg to take 3 times daily as needed has been sent to your pharmacy. Please be aware that this medication can cause drowsiness, so I recommend avoiding activities that require mental alertness while taking it. Ideally, you should take the medication at bedtime.  Additionally, continue using over-the-counter medications such  as Tylenol and aspirin as needed for pain relief. Please adhere to the non-pharmacological interventions for managing the muscle strain in your right lower lumbar spine, which include rest, ice therapy, gentle stretching, and proper posture  Non-Pharmacological Interventions for Muscle Strain Rest:Allow the affected muscle to rest to promote healing. Avoid activities that exacerbate the strain. Ice Therapy:Apply ice packs to the affected area for 15-20 minutes every 1-2 hours during the first 48 hours post-injury. This helps reduce  swelling and numb the pain. Heat Therapy:apply heat to the affected area (using a heating pad or warm towel) to help relax the muscles and improve blood flow. Use heat therapy for 15-20 minutes several times a day. Gentle Stretching:Once the initial pain and swelling have subsided, gentle stretching of the affected muscle can help improve flexibility and prevent stiffness. Strengthening Exercises:Gradually introduce strengthening exercises as the muscle begins to heal. Focus on low-impact exercises to build strength without further straining the muscle. Hydration and Nutrition:Maintain proper hydration and consume a balanced diet rich in vitamins and minerals to support muscle recovery. Foods high in protein, vitamin C, and magnesium can be particularly beneficial. Gradual Return to Activity:Once pain has decreased, gradually return to normal activities, being careful to listen to your body and avoid overexertion.  Orders: -     Cyclobenzaprine HCl; Take 1 tablet (5 mg total) by mouth 3 (three) times daily as needed for muscle spasms.  Dispense: 30 tablet; Refill: 1  Flank pain -     Urinalysis   Note: This chart has been completed using Engineer, civil (consulting) software, and while attempts have been made to ensure accuracy, certain words and phrases may not be transcribed as intended.   Follow-up: Return in about 1 month (around 11/02/2023).   Gilmore Laroche, FNP

## 2023-10-04 DIAGNOSIS — T148XXA Other injury of unspecified body region, initial encounter: Secondary | ICD-10-CM | POA: Insufficient documentation

## 2023-10-04 NOTE — Assessment & Plan Note (Signed)
A prescription for Flexeril 5 mg to take 3 times daily as needed has been sent to your pharmacy. Please be aware that this medication can cause drowsiness, so I recommend avoiding activities that require mental alertness while taking it. Ideally, you should take the medication at bedtime.  Additionally, continue using over-the-counter medications such as Tylenol and aspirin as needed for pain relief. Please adhere to the non-pharmacological interventions for managing the muscle strain in your right lower lumbar spine, which include rest, ice therapy, gentle stretching, and proper posture  Non-Pharmacological Interventions for Muscle Strain Rest:Allow the affected muscle to rest to promote healing. Avoid activities that exacerbate the strain. Ice Therapy:Apply ice packs to the affected area for 15-20 minutes every 1-2 hours during the first 48 hours post-injury. This helps reduce swelling and numb the pain. Heat Therapy:apply heat to the affected area (using a heating pad or warm towel) to help relax the muscles and improve blood flow. Use heat therapy for 15-20 minutes several times a day. Gentle Stretching:Once the initial pain and swelling have subsided, gentle stretching of the affected muscle can help improve flexibility and prevent stiffness. Strengthening Exercises:Gradually introduce strengthening exercises as the muscle begins to heal. Focus on low-impact exercises to build strength without further straining the muscle. Hydration and Nutrition:Maintain proper hydration and consume a balanced diet rich in vitamins and minerals to support muscle recovery. Foods high in protein, vitamin C, and magnesium can be particularly beneficial. Gradual Return to Activity:Once pain has decreased, gradually return to normal activities, being careful to listen to your body and avoid overexertion.

## 2023-10-07 LAB — CYTOLOGY - PAP: Diagnosis: NEGATIVE

## 2023-10-08 ENCOUNTER — Other Ambulatory Visit: Payer: Self-pay | Admitting: Family Medicine

## 2023-10-08 ENCOUNTER — Encounter: Payer: Self-pay | Admitting: Obstetrics & Gynecology

## 2023-10-08 ENCOUNTER — Encounter: Payer: Self-pay | Admitting: Family Medicine

## 2023-10-08 DIAGNOSIS — E669 Obesity, unspecified: Secondary | ICD-10-CM

## 2023-10-09 ENCOUNTER — Other Ambulatory Visit: Payer: Self-pay | Admitting: Family Medicine

## 2023-10-09 DIAGNOSIS — E669 Obesity, unspecified: Secondary | ICD-10-CM

## 2023-10-09 MED ORDER — WEGOVY 1 MG/0.5ML ~~LOC~~ SOAJ: 1.0000 mg | SUBCUTANEOUS | 0 refills | Status: DC

## 2023-10-09 NOTE — Telephone Encounter (Signed)
Patient calling to follow up on message below needs to take dose tomorrow. Please advise Thank you

## 2023-10-14 ENCOUNTER — Other Ambulatory Visit: Payer: Self-pay

## 2023-10-20 ENCOUNTER — Encounter: Payer: Self-pay | Admitting: Family Medicine

## 2023-10-21 ENCOUNTER — Other Ambulatory Visit: Payer: Self-pay | Admitting: Family Medicine

## 2023-10-21 DIAGNOSIS — F410 Panic disorder [episodic paroxysmal anxiety] without agoraphobia: Secondary | ICD-10-CM

## 2023-10-22 DIAGNOSIS — R32 Unspecified urinary incontinence: Secondary | ICD-10-CM | POA: Diagnosis not present

## 2023-10-22 DIAGNOSIS — E119 Type 2 diabetes mellitus without complications: Secondary | ICD-10-CM | POA: Diagnosis not present

## 2023-11-04 ENCOUNTER — Ambulatory Visit: Payer: Medicaid Other | Admitting: Family Medicine

## 2023-11-06 ENCOUNTER — Encounter: Payer: Self-pay | Admitting: Family Medicine

## 2023-11-06 ENCOUNTER — Other Ambulatory Visit: Payer: Self-pay | Admitting: Family Medicine

## 2023-11-06 DIAGNOSIS — F5104 Psychophysiologic insomnia: Secondary | ICD-10-CM

## 2023-11-06 DIAGNOSIS — E669 Obesity, unspecified: Secondary | ICD-10-CM

## 2023-11-06 DIAGNOSIS — F410 Panic disorder [episodic paroxysmal anxiety] without agoraphobia: Secondary | ICD-10-CM

## 2023-11-06 MED ORDER — LAMOTRIGINE 100 MG PO TABS: 100.0000 mg | ORAL_TABLET | Freq: Every day | ORAL | 1 refills | Status: DC

## 2023-11-06 MED ORDER — WEGOVY 1.7 MG/0.75ML ~~LOC~~ SOAJ: 1.7000 mg | SUBCUTANEOUS | 0 refills | Status: DC

## 2023-11-06 NOTE — Telephone Encounter (Signed)
Rx sent 

## 2023-11-10 ENCOUNTER — Ambulatory Visit: Payer: Medicaid Other | Admitting: Family Medicine

## 2023-11-11 DIAGNOSIS — E119 Type 2 diabetes mellitus without complications: Secondary | ICD-10-CM | POA: Diagnosis not present

## 2023-11-11 DIAGNOSIS — R32 Unspecified urinary incontinence: Secondary | ICD-10-CM | POA: Diagnosis not present

## 2023-11-13 ENCOUNTER — Encounter: Payer: Self-pay | Admitting: Family Medicine

## 2023-12-04 ENCOUNTER — Encounter: Payer: Self-pay | Admitting: Family Medicine

## 2023-12-04 ENCOUNTER — Other Ambulatory Visit: Payer: Self-pay

## 2023-12-04 DIAGNOSIS — F410 Panic disorder [episodic paroxysmal anxiety] without agoraphobia: Secondary | ICD-10-CM

## 2023-12-04 MED ORDER — HYDROXYZINE HCL 50 MG PO TABS: 50.0000 mg | ORAL_TABLET | Freq: Two times a day (BID) | ORAL | 1 refills | Status: DC | PRN

## 2023-12-11 ENCOUNTER — Ambulatory Visit: Payer: Medicaid Other | Admitting: Family Medicine

## 2023-12-28 NOTE — Patient Instructions (Addendum)

## 2023-12-28 NOTE — Progress Notes (Signed)
 Established Patient Office Visit   Subjective  Patient ID: Jacqueline [name], female    DOB: Mar 19, 1976  Age: 47 y.o. MRN: [id number]  Chief Complaint  Patient presents with   Medication Problem    Pt having issues on weight loss meds/ pt stats last dosage gave her a stomach ache and her household got the stomach bug as well so she wanted to stop taking it just to be sure that it wouldn't mess anything up but she does have 2 more shots in the fridge.    She  has a past medical history of Annual visit for general adult medical examination with abnormal findings (02/22/2020), Anxiety, Depression, major, single episode, moderate (HCC) (12/29/2019), Encounter for TB tine test (02/22/2020), GERD (gastroesophageal reflux disease), Kidney stones, Need for immunization against influenza (12/29/2019), Sinusitis, URI (upper respiratory infection), UTI (lower urinary tract infection), and Vaginal Pap smear, abnormal.  Patient reported experiencing side effects after increasing Wegovy  to the 1.7 mg dose. Symptoms included stomach pain, nausea, gas pain constipation, and diarrhea. The patient discontinued Wegovy  two weeks ago due to these side effects. During this time, the patient also experienced a stomach virus making it unclear whether her symptoms were due to the medication or the illness.   Review of Systems  Constitutional:  Negative for chills and fever.  Respiratory:  Negative for shortness of breath.   Cardiovascular:  Negative for chest pain.  Neurological:  Negative for dizziness and headaches.      Objective:     BP (!) 139/96   Ht 5' 3.6 (1.615 m)   Wt 205 lb 4 oz (93.1 kg)   LMP 07/24/2022   SpO2 95%   BMI 35.68 kg/m  BP Readings from Last 3 Encounters:  12/31/23 (!) 139/96  10/02/23 122/84  10/01/23 (!) 143/103      Physical Exam Vitals reviewed.  Constitutional:      General: She is not in acute distress.    Appearance: Normal appearance. She is not ill-appearing,  toxic-appearing or diaphoretic.  HENT:     Head: Normocephalic.  Eyes:     General:        Right eye: No discharge.        Left eye: No discharge.     Conjunctiva/sclera: Conjunctivae normal.  Cardiovascular:     Rate and Rhythm: Normal rate.     Pulses: Normal pulses.     Heart sounds: Normal heart sounds.  Pulmonary:     Effort: Pulmonary effort is normal. No respiratory distress.     Breath sounds: Normal breath sounds.  Abdominal:     General: Bowel sounds are normal.     Palpations: Abdomen is soft.     Tenderness: There is no abdominal tenderness. There is no right CVA tenderness, left CVA tenderness or guarding.  Skin:    General: Skin is warm and dry.     Capillary Refill: Capillary refill takes less than 2 seconds.  Neurological:     Mental Status: She is alert.  Psychiatric:        Mood and Affect: Mood normal.        Behavior: Behavior normal.      No results found for any visits on 12/31/23.  The 10-year ASCVD risk score (Arnett DK, et al., 2019) is: 7.4%    Assessment & Plan:  Obesity (BMI 30-39.9) Assessment & Plan: Restart  Wegovy  at 1.7 weekly injection Patient is advised to take over-the-counter medications for gas pain, constipation, and  diarrhea as needed. If symptoms resolve, may consider resuming the medication. If symptoms persist, the patient should follow up with their primary care provider to discuss a potential trial of Zepound.   Other orders -     Ondansetron  HCl; Take 1 tablet (4 mg total) by mouth every 8 (eight) hours as needed for nausea or vomiting.  Dispense: 20 tablet; Refill: 0    Return in 2 weeks (on 01/14/2024), or if symptoms worsen or fail to improve, for medication managment.   [name], FNP

## 2023-12-31 ENCOUNTER — Ambulatory Visit (INDEPENDENT_AMBULATORY_CARE_PROVIDER_SITE_OTHER): Payer: Medicaid Other | Admitting: Family Medicine

## 2023-12-31 ENCOUNTER — Telehealth: Payer: Self-pay | Admitting: Family Medicine

## 2023-12-31 VITALS — BP 139/96 | Ht 63.6 in | Wt 205.2 lb

## 2023-12-31 DIAGNOSIS — Z6835 Body mass index (BMI) 35.0-35.9, adult: Secondary | ICD-10-CM | POA: Diagnosis not present

## 2023-12-31 DIAGNOSIS — E669 Obesity, unspecified: Secondary | ICD-10-CM

## 2023-12-31 DIAGNOSIS — E119 Type 2 diabetes mellitus without complications: Secondary | ICD-10-CM | POA: Diagnosis not present

## 2023-12-31 DIAGNOSIS — R32 Unspecified urinary incontinence: Secondary | ICD-10-CM | POA: Diagnosis not present

## 2023-12-31 MED ORDER — ONDANSETRON HCL 4 MG PO TABS: 4.0000 mg | ORAL_TABLET | Freq: Three times a day (TID) | ORAL | 0 refills | Status: DC | PRN

## 2023-12-31 NOTE — Telephone Encounter (Signed)
 Will need to addend last ov note to discuss incontinence issues for billing purposes per [facility]. Pls advise and once complete- needs to be faxed back to [facility]

## 2023-12-31 NOTE — Telephone Encounter (Signed)
 Copied from CRM [id number]. Topic: General - Other >> Dec 31, 2023 11:34 AM Antonio DEL wrote: Reason for CRM: Angie calling from [facility], needing office visit notes to include incontinence as diagnosis from August 19, 2022-August 22,2024. Fax number is [phone]

## 2023-12-31 NOTE — Assessment & Plan Note (Addendum)
 Restart  Wegovy  at 1.7 weekly injection Patient is advised to take over-the-counter medications for gas pain, constipation, and diarrhea as needed. If symptoms resolve, may consider resuming the medication. If symptoms persist, the patient should follow up with their primary care provider to discuss a potential trial of Zepound.

## 2024-01-02 ENCOUNTER — Other Ambulatory Visit: Payer: Self-pay | Admitting: Family Medicine

## 2024-01-02 DIAGNOSIS — E669 Obesity, unspecified: Secondary | ICD-10-CM

## 2024-01-11 ENCOUNTER — Other Ambulatory Visit: Payer: Self-pay

## 2024-01-11 ENCOUNTER — Encounter: Payer: Self-pay | Admitting: Family Medicine

## 2024-01-11 DIAGNOSIS — F410 Panic disorder [episodic paroxysmal anxiety] without agoraphobia: Secondary | ICD-10-CM

## 2024-01-11 DIAGNOSIS — F5104 Psychophysiologic insomnia: Secondary | ICD-10-CM

## 2024-01-11 MED ORDER — LAMOTRIGINE 100 MG PO TABS: 100.0000 mg | ORAL_TABLET | Freq: Every day | ORAL | 1 refills | Status: DC

## 2024-01-20 ENCOUNTER — Encounter: Payer: Self-pay | Admitting: Family Medicine

## 2024-01-20 ENCOUNTER — Other Ambulatory Visit: Payer: Self-pay

## 2024-01-20 DIAGNOSIS — F410 Panic disorder [episodic paroxysmal anxiety] without agoraphobia: Secondary | ICD-10-CM

## 2024-01-20 MED ORDER — HYDROXYZINE HCL 50 MG PO TABS: 50.0000 mg | ORAL_TABLET | Freq: Two times a day (BID) | ORAL | 1 refills | Status: DC | PRN

## 2024-01-20 NOTE — Telephone Encounter (Signed)
Refill sent.

## 2024-02-25 ENCOUNTER — Telehealth: Payer: Medicaid Other | Admitting: Physician Assistant

## 2024-02-25 DIAGNOSIS — K644 Residual hemorrhoidal skin tags: Secondary | ICD-10-CM

## 2024-02-25 MED ORDER — HYDROCORT-PRAMOXINE (PERIANAL) 1-1 % EX FOAM: 1.0000 | Freq: Two times a day (BID) | CUTANEOUS | 1 refills | Status: AC

## 2024-02-25 NOTE — Progress Notes (Signed)
 I have spent 5 minutes in review of e-visit questionnaire, review and updating patient chart, medical decision making and response to patient.   Piedad Climes, PA-C

## 2024-02-25 NOTE — Progress Notes (Signed)

## 2024-03-03 ENCOUNTER — Ambulatory Visit (INDEPENDENT_AMBULATORY_CARE_PROVIDER_SITE_OTHER): Payer: Medicaid Other | Admitting: Family Medicine

## 2024-03-03 ENCOUNTER — Encounter: Payer: Self-pay | Admitting: Family Medicine

## 2024-03-03 VITALS — BP 144/82 | HR 106 | Ht 63.6 in | Wt 213.4 lb

## 2024-03-03 DIAGNOSIS — K648 Other hemorrhoids: Secondary | ICD-10-CM

## 2024-03-03 NOTE — Patient Instructions (Addendum)
 I appreciate the opportunity to provide care to you today!    Hemorrhoids - Treatment & Recommendations Continue prescribed medication as directed. Follow up if no improvement in 1 week or if symptoms worsen. Nonpharmacological Management:  Sitz Baths: Soak in warm water for 10-15 minutes, 2-3 times per day to soothe discomfort. Increase fiber and water intake to prevent constipation and straining (aim for 25-30g of fiber daily). Avoid prolonged sitting or straining on the toilet to reduce pressure on hemorrhoids. Use moist wipes or unscented baby wipes instead of dry toilet paper to prevent irritation. Follow-Up Recommendations: If symptoms persist despite treatment, further evaluation may be needed for additional interventions, such as prescription treatments or in-office procedures.    Please continue to a heart-healthy diet and increase your physical activities. Try to exercise for at least five days a week.    It was a pleasure to see you and I look forward to continuing to work together on your health and well-being. Please do not hesitate to call the office if you need care or have questions about your care.  In case of emergency, please visit the Emergency Department for urgent care, or contact our clinic at [phone] to schedule an appointment. We're here to help you!   Have a wonderful day and week. With Gratitude, Gilmore Laroche MSN, FNP-BC

## 2024-03-03 NOTE — Progress Notes (Signed)
 Acute Office Visit  Subjective:    Patient ID: Jacqueline [name], female    DOB: 1976/08/13, 48 y.o.   MRN: [id number]  Chief Complaint  Patient presents with   Hemorrhoids    X 1 week.     HPI The patient is in today with complaints of hemorrhoids for one week due to constipation. She had an e-visit on 02/25/2024 and was prescribed Hydrocortisone-Ace-Pramoxine 1-1%, which she reports not yet starting. She complains of pain during bowel movements, with no hematochezia noted.  Past Medical History:  Diagnosis Date   Annual visit for general adult medical examination with abnormal findings 02/22/2020   Anxiety    Depression, major, single episode, moderate (HCC) 12/29/2019   Encounter for TB tine test 02/22/2020   GERD (gastroesophageal reflux disease)    Kidney stones    hx UTI   Need for immunization against influenza 12/29/2019   Sinusitis    URI (upper respiratory infection)    UTI (lower urinary tract infection)    Vaginal Pap smear, abnormal     Past Surgical History:  Procedure Laterality Date   DILATION AND CURETTAGE OF UTERUS     LEEP  03/24/2023   LEEP N/A 03/24/2023   Procedure: LOOP ELECTROSURGICAL EXCISION PROCEDURE (LEEP);  Surgeon: Myna Hidalgo, DO;  Location: [facility];  Service: Gynecology;  Laterality: N/A;   TUBAL LIGATION      Family History  Problem Relation Age of Onset   Diabetes Other    Heart disease Mother    Diabetes Maternal Aunt    Cancer Paternal Aunt        lymph node, breast, and ovarian   Breast cancer Paternal Aunt    Diabetes Maternal Grandmother    Heart disease Maternal Grandmother    Heart failure Maternal Grandmother    Heart attack Maternal Grandfather    Cancer Paternal Grandmother        breast cancer   Breast cancer Paternal Grandmother    Cancer Paternal Grandfather        lung cancer    Social History   Socioeconomic History   Marital status: Single    Spouse name: Not on file   Number of children: 1   Years of  education: Not on file   Highest education level: Associate degree: academic program  Occupational History   Not on file  Tobacco Use   Smoking status: Every Day    Current packs/day: 0.00    Average packs/day: 0.5 packs/day for 5.0 years (2.5 ttl pk-yrs)    Types: Cigarettes    Start date: 05/08/2005    Last attempt to quit: 05/08/2010    Years since quitting: 13.8   Smokeless tobacco: Never  Vaping Use   Vaping status: Never Used  Substance and Sexual Activity   Alcohol use: No   Drug use: No   Sexual activity: Not Currently    Birth control/protection: Surgical, Post-menopausal    Comment: tubal  Other Topics Concern   Not on file  Social History Narrative   Lives with son Marlene Bast is 47 years old.   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    Social Drivers of Health   Financial Resource Strain: Low Risk  (12/28/2023)   Overall Financial Resource Strain (CARDIA)    Difficulty of Paying Living Expenses: Not very hard  Food Insecurity: No Food Insecurity (12/28/2023)   Hunger Vital Sign    Worried About Running Out of Food in the Last Year: Never true    Ran Out of Food in the Last Year: Never true  Transportation Needs: No Transportation Needs (12/28/2023)   PRAPARE - Administrator, Civil Service (Medical): No    Lack of Transportation (Non-Medical): No  Physical Activity: Insufficiently Active (12/28/2023)   Exercise Vital Sign    Days of Exercise per Week: 1 day    Minutes of Exercise per Session: 30 min  Stress: Stress Concern Present (12/28/2023)   [employer] of Occupational Health - Occupational Stress Questionnaire    Feeling of Stress : To some extent  Social Connections:  Moderately Integrated (12/28/2023)   Social Connection and Isolation Panel [NHANES]    Frequency of Communication with Friends and Family: More than three times a week    Frequency of Social Gatherings with Friends and Family: More than three times a week    Attends Religious Services: More than 4 times per year    Active Member of [employer] or Organizations: Yes    Attends Engineer, structural: More than 4 times per year    Marital Status: Divorced  Intimate Partner Violence: Not At Risk (02/06/2023)   Humiliation, Afraid, Rape, and Kick questionnaire    Fear of Current or Ex-Partner: No    Emotionally Abused: No    Physically Abused: No    Sexually Abused: No    Outpatient Medications Prior to Visit  Medication Sig Dispense Refill   acetaminophen (TYLENOL) 325 MG tablet Take 2 tablets (650 mg total) by mouth every 6 (six) hours as needed.     azelastine (ASTELIN) 0.1 % nasal spray Place 2 sprays into both nostrils 2 (two) times daily. Use in each nostril as directed 30 mL 12   busPIRone (BUSPAR) 10 MG tablet TAKE 1 TABLET(10 MG) BY MOUTH TWICE DAILY 90 tablet 1   hydrOXYzine (ATARAX) 50 MG tablet Take 1 tablet (50 mg total) by mouth 2 (two) times daily as needed for anxiety. 30 tablet 1   lamoTRIgine (LAMICTAL) 100 MG tablet Take 1 tablet (100 mg total) by mouth daily. 30 tablet 1   ondansetron (ZOFRAN) 4 MG tablet Take 1 tablet (4 mg total) by mouth every 8 (eight) hours as needed for nausea or vomiting. 20 tablet 0   Vitamin D, Ergocalciferol, (DRISDOL) 1.25 MG (50000 UNIT) CAPS capsule Take 1 capsule (50,000 Units total) by mouth every 7 (seven) days. 20 capsule 1   hydrocortisone-pramoxine (PROCTOFOAM-HC) rectal foam Place 1 applicator rectally 2 (two) times daily. (Patient not taking: Reported on 03/03/2024) 10 g 1   metFORMIN (GLUCOPHAGE) 500 MG tablet Take 1 tablet (500 mg total) by mouth daily. (Patient not taking: Reported on 03/03/2024) 180 tablet 3   Semaglutide-Weight  Management (WEGOVY) 1 MG/0.5ML SOAJ Inject 1 mg into the skin once a week. (Patient not taking: Reported on 03/03/2024) 2 mL 0   WEGOVY 1.7 MG/0.75ML SOAJ INJECT 1.7 MG UNDER THE SKIN ONCE A WEEK. (Patient not taking: Reported on 03/03/2024) 3 mL 0   cyclobenzaprine (FLEXERIL) 5 MG tablet Take 1 tablet (5 mg total) by mouth 3 (three) times daily as needed for muscle  spasms. (Patient not taking: Reported on 12/31/2023) 30 tablet 1   gabapentin (NEURONTIN) 300 MG capsule Take 300 mg by mouth 2 (two) times daily. Can take a 300 mg throughout the day as needed for anxiety (Patient not taking: Reported on 12/31/2023)     promethazine-dextromethorphan (PROMETHAZINE-DM) 6.25-15 MG/5ML syrup Take 5 mLs by mouth 4 (four) times daily as needed. (Patient not taking: Reported on 12/31/2023) 100 mL 0   sodium chloride (OCEAN) 0.65 % SOLN nasal spray Place 1 spray into both nostrils as needed for congestion. (Patient not taking: Reported on 12/31/2023) 15 mL 0   No facility-administered medications prior to visit.    Allergies  Allergen Reactions   Penicillins Nausea And Vomiting and Rash    Review of Systems  Constitutional:  Negative for chills and fever.  Eyes:  Negative for visual disturbance.  Respiratory:  Negative for chest tightness and shortness of breath.   Neurological:  Negative for dizziness and headaches.       Objective:    Physical Exam HENT:     Head: Normocephalic.     Mouth/Throat:     Mouth: Mucous membranes are moist.  Cardiovascular:     Rate and Rhythm: Normal rate.     Heart sounds: Normal heart sounds.  Pulmonary:     Effort: Pulmonary effort is normal.     Breath sounds: Normal breath sounds.  Skin:    Comments: no external hemorroids noted  Neurological:     Mental Status: She is alert.     BP (!) 144/82   Pulse (!) 106   Ht 5' 3.6" (1.615 m)   Wt 213 lb 6 oz (96.8 kg)   LMP 07/24/2022   SpO2 97%   BMI 37.09 kg/m  Wt Readings from Last 3 Encounters:  03/03/24 213  lb 6 oz (96.8 kg)  12/31/23 205 lb 4 oz (93.1 kg)  10/02/23 207 lb 1.9 oz (93.9 kg)       Assessment & Plan:  Other hemorrhoids Assessment & Plan: Likely internal hemorrhoids Continue prescribed medication as directed. Follow up if no improvement in 1 week or if symptoms worsen. Nonpharmacological Management:  Sitz Baths: Soak in warm water for 10-15 minutes, 2-3 times per day to soothe discomfort. Increase fiber and water intake to prevent constipation and straining (aim for 25-30g of fiber daily). Avoid prolonged sitting or straining on the toilet to reduce pressure on hemorrhoids. Use moist wipes or unscented baby wipes instead of dry toilet paper to prevent irritation. Follow-Up Recommendations: If symptoms persist despite treatment, further evaluation may be needed for additional interventions, such as prescription treatments or in-office procedures.   Note: This chart has been completed using Engineer, civil (consulting) software, and while attempts have been made to ensure accuracy, certain words and phrases may not be transcribed as intended.    Gilmore Laroche, FNP

## 2024-03-06 DIAGNOSIS — K649 Unspecified hemorrhoids: Secondary | ICD-10-CM | POA: Insufficient documentation

## 2024-03-06 NOTE — Assessment & Plan Note (Signed)
 Likely internal hemorrhoids Continue prescribed medication as directed. Follow up if no improvement in 1 week or if symptoms worsen. Nonpharmacological Management:  Sitz Baths: Soak in warm water for 10-15 minutes, 2-3 times per day to soothe discomfort. Increase fiber and water intake to prevent constipation and straining (aim for 25-30g of fiber daily). Avoid prolonged sitting or straining on the toilet to reduce pressure on hemorrhoids. Use moist wipes or unscented baby wipes instead of dry toilet paper to prevent irritation. Follow-Up Recommendations: If symptoms persist despite treatment, further evaluation may be needed for additional interventions, such as prescription treatments or in-office procedures.

## 2024-03-16 ENCOUNTER — Encounter: Payer: Self-pay | Admitting: Family Medicine

## 2024-03-17 ENCOUNTER — Other Ambulatory Visit: Payer: Self-pay | Admitting: Family Medicine

## 2024-03-17 ENCOUNTER — Other Ambulatory Visit: Payer: Self-pay

## 2024-03-17 DIAGNOSIS — F410 Panic disorder [episodic paroxysmal anxiety] without agoraphobia: Secondary | ICD-10-CM

## 2024-03-17 MED ORDER — HYDROXYZINE HCL 50 MG PO TABS: 50.0000 mg | ORAL_TABLET | Freq: Two times a day (BID) | ORAL | 1 refills | Status: AC | PRN

## 2024-03-17 MED ORDER — BUSPIRONE HCL 10 MG PO TABS: 10.0000 mg | ORAL_TABLET | Freq: Two times a day (BID) | ORAL | 1 refills | Status: DC

## 2024-03-24 ENCOUNTER — Encounter: Payer: Self-pay | Admitting: Family Medicine

## 2024-03-26 ENCOUNTER — Other Ambulatory Visit: Payer: Self-pay | Admitting: Family Medicine

## 2024-03-26 DIAGNOSIS — F410 Panic disorder [episodic paroxysmal anxiety] without agoraphobia: Secondary | ICD-10-CM

## 2024-03-26 DIAGNOSIS — F5104 Psychophysiologic insomnia: Secondary | ICD-10-CM

## 2024-03-26 MED ORDER — LAMOTRIGINE 100 MG PO TABS: 100.0000 mg | ORAL_TABLET | Freq: Every day | ORAL | 1 refills | Status: DC

## 2024-03-26 NOTE — Telephone Encounter (Signed)
 Rx sent

## 2024-04-14 ENCOUNTER — Ambulatory Visit: Payer: Medicaid Other | Admitting: Family Medicine

## 2024-04-21 ENCOUNTER — Encounter: Payer: Self-pay | Admitting: Obstetrics & Gynecology

## 2024-04-21 ENCOUNTER — Ambulatory Visit (INDEPENDENT_AMBULATORY_CARE_PROVIDER_SITE_OTHER): Admitting: Obstetrics & Gynecology

## 2024-04-21 ENCOUNTER — Other Ambulatory Visit (HOSPITAL_COMMUNITY)
Admission: RE | Admit: 2024-04-21 | Discharge: 2024-04-21 | Disposition: A | Discharge status: Home / Self Care | Source: Ambulatory Visit | Attending: Obstetrics & Gynecology | Admitting: Obstetrics & Gynecology

## 2024-04-21 VITALS — BP 147/92 | HR 98 | Ht 60.75 in | Wt 209.5 lb

## 2024-04-21 DIAGNOSIS — R030 Elevated blood-pressure reading, without diagnosis of hypertension: Secondary | ICD-10-CM

## 2024-04-21 DIAGNOSIS — Z01419 Encounter for gynecological examination (general) (routine) without abnormal findings: Secondary | ICD-10-CM

## 2024-04-21 NOTE — Progress Notes (Signed)
 WELL-WOMAN EXAMINATION Patient name: Jacqueline [name] MRN [id number]  Date of birth: May 30, 1976 Chief Complaint:   Gynecologic Exam  History of Present Illness:   Jacqueline [name] is a 48 y.o. [phone] PM female being seen today for a routine well-woman exam.   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  Denies pelvic or abdominal pain.  No acute gyn concerns  Patient's last menstrual period was 07/24/2022.  Last pap as above.  Last mammogram: 05/2023. Last colonoscopy: cologuard in process     04/21/2024    2:23 PM 03/03/2024    2:25 PM 10/02/2023    4:04 PM 10/02/2023    3:57 PM 07/29/2023    3:48 PM  Depression screen PHQ 2/9  Decreased Interest 0 0 0 0 0  Down, Depressed, Hopeless 0 0 0 0 0  PHQ - 2 Score 0 0 0 0 0  Altered sleeping 1 0 0 0 0  Tired, decreased energy 1 0 0 0 0  Change in appetite 1 0 0 0 0  Feeling bad or failure about yourself  0 0 0 0 0  Trouble concentrating 0 0 0 0 0  Moving slowly or fidgety/restless 0 0 0 0 0  Suicidal thoughts 0 0 0 0 0  PHQ-9 Score 3 0 0 0 0  Difficult doing work/chores  Not difficult at all Not difficult at all Not difficult at all Not difficult at all      Review of Systems:   Pertinent items are noted in HPI Denies any headaches, blurred vision, fatigue, shortness of breath, chest pain, abdominal pain, bowel movements, urination, or intercourse unless otherwise stated above.  Pertinent History Reviewed:  Reviewed past medical,surgical, social and family history.  Reviewed problem list, medications and allergies. Physical Assessment:   Vitals:   04/21/24 1410 04/21/24 1429  BP: (!) 132/90 (!) 147/92  Pulse: 93 98  Weight: 209 lb 8 oz (95 kg)   Height: 5' 0.75" (1.543 m)   Body mass index is 39.91 kg/m.        Physical Examination:   General appearance - well appearing, and in no distress  Mental status - alert, oriented to person, place, and time  Psych:  She has a  normal mood and affect  Skin - warm and dry, normal color, no suspicious lesions noted  Chest - effort normal, all lung fields clear to auscultation bilaterally  Heart - normal rate and regular rhythm  Neck:  midline trachea, no thyromegaly or nodules  Breasts - breasts appear normal, no suspicious masses, no skin or nipple changes or  axillary nodes  Abdomen - soft, nontender, nondistended, no masses or organomegaly  Pelvic - VULVA: normal appearing vulva with no masses, tenderness or lesions  VAGINA: normal appearing vagina with normal color and discharge, no lesions  CERVIX: normal appearing cervix without discharge or lesions, no CMT  Thin prep pap is done with HR HPV cotesting  UTERUS: uterus is felt to be normal size, shape, consistency and nontender   ADNEXA: No adnexal masses or tenderness noted.  Extremities:  No swelling or varicosities noted  Chaperone: Lorean Rodes     Assessment & Plan:  1) Well-Woman Exam -pap collected, reviewed screening guidelines, further management pending results  2) elevated BP -pt notes nervousness when seen by providers -recommendation to check BP at home  No orders of the defined types were placed in this encounter.   Meds: No  orders of the defined types were placed in this encounter.   Follow-up: Return in about 1 year (around 04/21/2025) for Annual.   Willoughby Doell, DO Attending Obstetrician & Gynecologist, [facility], [facility]

## 2024-04-27 LAB — CYTOLOGY - PAP
Adequacy: ABSENT
Comment: NEGATIVE
Diagnosis: NEGATIVE
High risk HPV: NEGATIVE

## 2024-04-28 ENCOUNTER — Encounter: Payer: Self-pay | Admitting: Obstetrics & Gynecology

## 2024-05-02 ENCOUNTER — Encounter: Payer: Self-pay | Admitting: Family Medicine

## 2024-05-02 ENCOUNTER — Ambulatory Visit (INDEPENDENT_AMBULATORY_CARE_PROVIDER_SITE_OTHER): Admitting: Family Medicine

## 2024-05-02 VITALS — BP 111/72 | HR 95 | Ht 60.75 in | Wt 208.1 lb

## 2024-05-02 DIAGNOSIS — F411 Generalized anxiety disorder: Secondary | ICD-10-CM | POA: Diagnosis not present

## 2024-05-02 DIAGNOSIS — R110 Nausea: Secondary | ICD-10-CM

## 2024-05-02 DIAGNOSIS — R7301 Impaired fasting glucose: Secondary | ICD-10-CM

## 2024-05-02 DIAGNOSIS — E038 Other specified hypothyroidism: Secondary | ICD-10-CM

## 2024-05-02 DIAGNOSIS — E559 Vitamin D deficiency, unspecified: Secondary | ICD-10-CM | POA: Diagnosis not present

## 2024-05-02 DIAGNOSIS — F5104 Psychophysiologic insomnia: Secondary | ICD-10-CM

## 2024-05-02 DIAGNOSIS — E669 Obesity, unspecified: Secondary | ICD-10-CM | POA: Diagnosis not present

## 2024-05-02 DIAGNOSIS — F410 Panic disorder [episodic paroxysmal anxiety] without agoraphobia: Secondary | ICD-10-CM

## 2024-05-02 DIAGNOSIS — E7849 Other hyperlipidemia: Secondary | ICD-10-CM | POA: Diagnosis not present

## 2024-05-02 MED ORDER — LAMOTRIGINE 100 MG PO TABS: 100.0000 mg | ORAL_TABLET | Freq: Every day | ORAL | 1 refills | Status: DC

## 2024-05-02 MED ORDER — VITAMIN D (ERGOCALCIFEROL) 1.25 MG (50000 UNIT) PO CAPS: 50000.0000 [IU] | ORAL_CAPSULE | ORAL | 1 refills | Status: DC

## 2024-05-02 MED ORDER — HYDROXYZINE PAMOATE 100 MG PO CAPS: 100.0000 mg | ORAL_CAPSULE | Freq: Two times a day (BID) | ORAL | 1 refills | Status: AC | PRN

## 2024-05-02 MED ORDER — ONDANSETRON HCL 4 MG PO TABS: 4.0000 mg | ORAL_TABLET | Freq: Three times a day (TID) | ORAL | 0 refills | Status: AC | PRN

## 2024-05-02 MED ORDER — BUSPIRONE HCL 10 MG PO TABS: 10.0000 mg | ORAL_TABLET | Freq: Two times a day (BID) | ORAL | 1 refills | Status: DC

## 2024-05-02 NOTE — Assessment & Plan Note (Signed)
 The patient reports minimal relief with Hydroxyzine  50 mg, although it was initially effective. The dose will be increased to 100 mg as needed and at bedtime for better symptom control. The importance of follow-up was discussed should her anxiety worsen or fail to improve on the current treatment regimen. The patient denies any suicidal thoughts or ideation at this time.

## 2024-05-02 NOTE — Progress Notes (Signed)
 Established Patient Office Visit  Subjective:  Patient ID: Jacqueline [name], female    DOB: [birth year]  Age: 48 y.o. MRN: [id number]  CC:  Chief Complaint  Patient presents with   Medical Management of Chronic Issues    Discuss alternative to Wegovy : has not had shot in around 4 months Since starting shot she has experienced random bouts of nausea(always during 3-4 am)  and constipation w/ stomach pain.   Would like to receive 60-90 day supply of hydroxyzine  since she is prescribed to take 2times daily. She is running out in 15 -20 days.     HPI Jacqueline [name] is a 48 y.o. female with past medical history of anxiety, vitamin D  deficiency and insomnia presents for f/u of  chronic medical conditions. For the details of today's visit, please refer to the assessment and plan.     Past Medical History:  Diagnosis Date   Annual visit for general adult medical examination with abnormal findings 02/22/2020   Anxiety    Depression, major, single episode, moderate (HCC) 12/29/2019   Encounter for TB tine test 02/22/2020   GERD (gastroesophageal reflux disease)    Kidney stones    hx UTI   Need for immunization against influenza 12/29/2019   Sinusitis    URI (upper respiratory infection)    UTI (lower urinary tract infection)    Vaginal Pap smear, abnormal     Past Surgical History:  Procedure Laterality Date   DILATION AND CURETTAGE OF UTERUS     LEEP  03/24/2023   LEEP N/A 03/24/2023   Procedure: LOOP ELECTROSURGICAL EXCISION PROCEDURE (LEEP);  Surgeon: Ozan, Jennifer, DO;  Location: [facility];  Service: Gynecology;  Laterality: N/A;   TUBAL LIGATION      Family History  Problem Relation Age of Onset   Diabetes Other    Heart disease Mother    Diabetes Maternal Aunt    Cancer Paternal Aunt        lymph node, breast, and ovarian   Breast cancer Paternal Aunt    Diabetes Maternal Grandmother    Heart disease Maternal Grandmother    Heart failure Maternal Grandmother    Heart attack  Maternal Grandfather    Cancer Paternal Grandmother        breast cancer   Breast cancer Paternal Grandmother    Cancer Paternal Grandfather        lung cancer    Social History   Socioeconomic History   Marital status: Single    Spouse name: Not on file   Number of children: 1   Years of education: Not on file   Highest education level: Associate degree: academic program  Occupational History   Not on file  Tobacco Use   Smoking status: Every Day    Current packs/day: 0.00    Average packs/day: 0.5 packs/day for 5.0 years (2.5 ttl pk-yrs)    Types: Cigarettes    Start date: 05/08/2005    Last attempt to quit: 05/08/2010    Years since quitting: 13.9   Smokeless tobacco: Never  Vaping Use   Vaping status: Never Used  Substance and Sexual Activity   Alcohol use: No   Drug use: No   Sexual activity: Not Currently    Birth control/protection: Surgical, Post-menopausal    Comment: tubal  Other Topics Concern   Not on file  Social History Narrative   Lives with son Elwin Hammond is 69 years old.   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    Social Drivers of Health   Financial Resource Strain: Low Risk  (12/28/2023)   Overall Financial Resource Strain (CARDIA)    Difficulty of Paying Living Expenses: Not very hard  Food Insecurity: No Food Insecurity (12/28/2023)   Hunger Vital Sign    Worried About Running Out of Food in the Last Year: Never true    Ran Out of Food in the Last Year: Never true  Transportation Needs: No Transportation Needs (12/28/2023)   PRAPARE - Administrator, Civil Service (Medical): No    Lack of Transportation (Non-Medical): No  Physical Activity: Insufficiently Active  (12/28/2023)   Exercise Vital Sign    Days of Exercise per Week: 1 day    Minutes of Exercise per Session: 30 min  Stress: Stress Concern Present (12/28/2023)   [employer] of Occupational Health - Occupational Stress Questionnaire    Feeling of Stress : To some extent  Social Connections: Moderately Integrated (12/28/2023)   Social Connection and Isolation Panel [NHANES]    Frequency of Communication with Friends and Family: More than three times a week    Frequency of Social Gatherings with Friends and Family: More than three times a week    Attends Religious Services: More than 4 times per year    Active Member of [employer] or Organizations: Yes    Attends Engineer, structural: More than 4 times per year    Marital Status: Divorced  Intimate Partner Violence: Not At Risk (02/06/2023)   Humiliation, Afraid, Rape, and Kick questionnaire    Fear of Current or Ex-Partner: No    Emotionally Abused: No    Physically Abused: No    Sexually Abused: No    Outpatient Medications Prior to Visit  Medication Sig Dispense Refill   acetaminophen  (TYLENOL ) 325 MG tablet Take 2 tablets (650 mg total) by mouth every 6 (six) hours as needed.     azelastine  (ASTELIN ) 0.1 % nasal spray Place 2 sprays into both nostrils 2 (two) times daily. Use in each nostril as directed 30 mL 12   hydrOXYzine  (ATARAX ) 50 MG tablet Take 1 tablet (50 mg total) by mouth 2 (two) times daily as needed for anxiety. 30 tablet 1   metFORMIN  (GLUCOPHAGE ) 500 MG tablet Take 1 tablet (500 mg total) by mouth daily. 180 tablet 3   busPIRone  (BUSPAR ) 10 MG tablet Take 1 tablet (10 mg total) by mouth 2 (two) times daily. 90 tablet 1   lamoTRIgine  (LAMICTAL ) 100 MG tablet Take 1 tablet (100 mg total) by mouth daily. 60 tablet 1   ondansetron  (ZOFRAN ) 4 MG tablet Take 1 tablet (4 mg total) by mouth every 8 (eight) hours as needed for nausea or vomiting. 20 tablet 0   Vitamin D , Ergocalciferol , (DRISDOL ) 1.25 MG (50000 UNIT)  CAPS capsule Take 1 capsule (50,000 Units total) by mouth every 7 (seven) days. 20 capsule 1   hydrocortisone -pramoxine (PROCTOFOAM-HC) rectal foam Place 1 applicator rectally 2 (two) times daily. (Patient not taking: Reported on 03/03/2024) 10 g 1   Semaglutide -Weight Management (WEGOVY ) 1 MG/0.5ML SOAJ Inject 1 mg into the  skin once a week. (Patient not taking: Reported on 03/03/2024) 2 mL 0   WEGOVY  1.7 MG/0.75ML SOAJ INJECT 1.7 MG UNDER THE SKIN ONCE A WEEK. (Patient not taking: Reported on 05/02/2024) 3 mL 0   No facility-administered medications prior to visit.    Allergies  Allergen Reactions   Penicillins Nausea And Vomiting and Rash    ROS Review of Systems  Constitutional:  Negative for chills and fever.  Eyes:  Negative for visual disturbance.  Respiratory:  Negative for chest tightness and shortness of breath.   Neurological:  Negative for dizziness and headaches.      Objective:    Physical Exam Constitutional:      Appearance: She is obese.  HENT:     Head: Normocephalic.     Mouth/Throat:     Mouth: Mucous membranes are moist.  Cardiovascular:     Rate and Rhythm: Normal rate.     Heart sounds: Normal heart sounds.  Pulmonary:     Effort: Pulmonary effort is normal.     Breath sounds: Normal breath sounds.  Neurological:     Mental Status: She is alert.     BP 111/72   Pulse 95   Ht 5' 0.75" (1.543 m)   Wt 208 lb 1.3 oz (94.4 kg)   LMP 07/24/2022   SpO2 95%   BMI 39.64 kg/m  Wt Readings from Last 3 Encounters:  05/02/24 208 lb 1.3 oz (94.4 kg)  04/21/24 209 lb 8 oz (95 kg)  03/03/24 213 lb 6 oz (96.8 kg)    Lab Results  Component Value Date   TSH 1.930 08/04/2023   Lab Results  Component Value Date   WBC 15.9 (H) 08/04/2023   HGB 14.5 08/04/2023   HCT 42.7 08/04/2023   MCV 82 08/04/2023   PLT 536 (H) 08/04/2023   Lab Results  Component Value Date   NA 142 08/04/2023   K 3.9 08/04/2023   CO2 23 08/04/2023   GLUCOSE 80 08/04/2023   BUN  13 08/04/2023   CREATININE 0.70 08/04/2023   BILITOT 0.3 08/04/2023   ALKPHOS 103 08/04/2023   AST 9 08/04/2023   ALT 12 08/04/2023   PROT 7.0 08/04/2023   ALBUMIN 4.3 08/04/2023   CALCIUM 9.2 08/04/2023   ANIONGAP 9 03/20/2023   EGFR 109 08/04/2023   Lab Results  Component Value Date   CHOL 188 08/04/2023   Lab Results  Component Value Date   HDL 46 08/04/2023   Lab Results  Component Value Date   LDLCALC 125 (H) 08/04/2023   Lab Results  Component Value Date   TRIG 94 08/04/2023   Lab Results  Component Value Date   CHOLHDL 4.1 08/04/2023   Lab Results  Component Value Date   HGBA1C 6.4 (H) 08/04/2023      Assessment & Plan:  Generalized anxiety disorder with panic attacks Assessment & Plan: The patient reports minimal relief with Hydroxyzine  50 mg, although it was initially effective. The dose will be increased to 100 mg as needed and at bedtime for better symptom control. The importance of follow-up was discussed should her anxiety worsen or fail to improve on the current treatment regimen. The patient denies any suicidal thoughts or ideation at this time.   Orders: -     busPIRone  HCl; Take 1 tablet (10 mg total) by mouth 2 (two) times daily.  Dispense: 90 tablet; Refill: 1 -     lamoTRIgine ; Take 1 tablet (100 mg total) by mouth  daily.  Dispense: 60 tablet; Refill: 1 -     hydrOXYzine  Pamoate; Take 1 capsule (100 mg total) by mouth 2 (two) times daily as needed for itching.  Dispense: 90 capsule; Refill: 1  Obesity (BMI 30-39.9) Assessment & Plan: Wegovy  was discontinued due to side effects associated with the medication. The patient was informed that Zepbound has a similar side effect profile. Oral phentermine was discussed as an alternative option for weight management; however, the patient declined therapy due to concerns about potential cardiovascular side effects. She reports motivation to implement lifestyle changes, including decreasing her intake  of greasy, fatty, and starchy foods, along with increased physical activity. The patient also expressed interest in evaluating her cortisol levels, which will be assessed today. Labs are currently pending.   Orders: -     Cortisol  Psychophysiological insomnia -     lamoTRIgine ; Take 1 tablet (100 mg total) by mouth daily.  Dispense: 60 tablet; Refill: 1  Vitamin D  deficiency Assessment & Plan: Encouraged to increase his intake of vitamin D -rich foods such as fatty fish (e.g., salmon, mackerel, and sardines), fortified dairy products, egg yolks, and fortified cereals.   Orders: -     Vitamin D  (Ergocalciferol ); Take 1 capsule (50,000 Units total) by mouth every 7 (seven) days.  Dispense: 27 capsule; Refill: 1 -     VITAMIN D  25 Hydroxy (Vit-D Deficiency, Fractures)  Nausea -     Ondansetron  HCl; Take 1 tablet (4 mg total) by mouth every 8 (eight) hours as needed for nausea or vomiting.  Dispense: 20 tablet; Refill: 0  IFG (impaired fasting glucose) -     Hemoglobin A1c  TSH (thyroid -stimulating hormone deficiency) -     TSH + free T4  Other hyperlipidemia -     Lipid panel -     CMP14+EGFR -     CBC with Differential/Platelet  Note: This chart has been completed using Engineer, civil (consulting) software, and while attempts have been made to ensure accuracy, certain words and phrases may not be transcribed as intended.    Follow-up: Return in about 6 months (around 11/02/2024).   Cayde Held, FNP

## 2024-05-02 NOTE — Assessment & Plan Note (Signed)
 Wegovy  was discontinued due to side effects associated with the medication. The patient was informed that Zepbound has a similar side effect profile. Oral phentermine was discussed as an alternative option for weight management; however, the patient declined therapy due to concerns about potential cardiovascular side effects. She reports motivation to implement lifestyle changes, including decreasing her intake of greasy, fatty, and starchy foods, along with increased physical activity. The patient also expressed interest in evaluating her cortisol levels, which will be assessed today. Labs are currently pending.

## 2024-05-02 NOTE — Assessment & Plan Note (Signed)
 Encouraged to increase his intake of vitamin D-rich foods such as fatty fish (e.g., salmon, mackerel, and sardines), fortified dairy products, egg yolks, and fortified cereals.

## 2024-05-02 NOTE — Patient Instructions (Addendum)
 I appreciate the opportunity to provide care to you today!    Follow up:  6 months  Labs: please stop by the lab during the labs to get your blood drawn (CBC, CMP, TSH, Lipid profile, HgA1c, Vit D)    Please continue to a heart-healthy diet and increase your physical activities. Try to exercise for at least five days a week.    It was a pleasure to see you and I look forward to continuing to work together on your health and well-being. Please do not hesitate to call the office if you need care or have questions about your care.  In case of emergency, please visit the Emergency Department for urgent care, or contact our clinic at [phone] to schedule an appointment. We're here to help you!   Have a wonderful day and week. With Gratitude, Zedrick Springsteen MSN, FNP-BC

## 2024-05-06 ENCOUNTER — Encounter: Payer: Self-pay | Admitting: Family Medicine

## 2024-05-09 NOTE — Telephone Encounter (Signed)
 Please inform the patient that I am currently out of the office and all messages will be addressed upon my return on 05/16/2024

## 2024-05-12 ENCOUNTER — Ambulatory Visit
Admission: RE | Admit: 2024-05-12 | Discharge: 2024-05-12 | Disposition: A | Discharge status: Home / Self Care | Payer: Self-pay | Source: Ambulatory Visit | Attending: Nurse Practitioner | Admitting: Nurse Practitioner

## 2024-05-12 VITALS — BP 144/93 | HR 102 | Temp 98.3°F | Resp 18

## 2024-05-12 DIAGNOSIS — N3000 Acute cystitis without hematuria: Secondary | ICD-10-CM | POA: Diagnosis not present

## 2024-05-12 LAB — POCT URINALYSIS DIP (MANUAL ENTRY)
Blood, UA: NEGATIVE
Glucose, UA: 100 mg/dL — AB
Leukocytes, UA: NEGATIVE
Nitrite, UA: POSITIVE — AB
Protein Ur, POC: 100 mg/dL — AB
Spec Grav, UA: 1.02
Urobilinogen, UA: 2 U/dL — AB
pH, UA: 5.5

## 2024-05-12 MED ORDER — NITROFURANTOIN MONOHYD MACRO 100 MG PO CAPS: 100.0000 mg | ORAL_CAPSULE | Freq: Two times a day (BID) | ORAL | 0 refills | Status: AC

## 2024-05-12 NOTE — Discharge Instructions (Signed)
 A urine culture has been ordered.  You will be contacted if the results of the culture indicate that the medication prescribed today needs to be changed. May take over-the-counter Tylenol  or ibuprofen  as needed for pain, fever, or general discomfort. Try to drink at least 8-10 eight ounce glasses of water daily while symptoms persist. Develop a toileting schedule that will allow you to urinate at least every 2 hours. Avoid caffeine such as tea, soda, and coffee while symptoms persist. Go to the emergency department immediately if you experience worsening urinary symptoms with fever, chills, or other concerns. Follow-up as needed.

## 2024-05-12 NOTE — ED Provider Notes (Addendum)
 [facility]    CSN: [id number] Arrival date & time: 05/12/24  1455      History   Chief Complaint Chief Complaint  Patient presents with   Urinary Frequency    I think I may have a kidney infection or an urinary tract infection. Lots of pressure and feel like I have to use the bathroom often. Some pelvic pain. - Entered by patient    HPI Jacqueline [name] is a 48 y.o. female.   The history is provided by the patient.   Patient presents with a 1 day history of low back pain, lower abdominal pressure, urinary frequency, and burning with urination.  Patient also states that she "felt hot" at home but did not check her temperature.  She denies chills, headache, chest pain, nausea, vomiting, diarrhea, hematuria, decreased urine stream, or flank pain.  Patient also denies vaginal symptoms.  Patient states it has been "20 years" since her last UTI.  States that she did start Azo for her symptoms and took Tylenol .  Past Medical History:  Diagnosis Date   Annual visit for general adult medical examination with abnormal findings 02/22/2020   Anxiety    Depression, major, single episode, moderate (HCC) 12/29/2019   Encounter for TB tine test 02/22/2020   GERD (gastroesophageal reflux disease)    Kidney stones    hx UTI   Need for immunization against influenza 12/29/2019   Sinusitis    URI (upper respiratory infection)    UTI (lower urinary tract infection)    Vaginal Pap smear, abnormal     Patient Active Problem List   Diagnosis Date Noted   Hemorrhoids 03/06/2024   Muscle strain 10/04/2023   Viral illness 07/31/2023   High grade squamous intraepithelial lesion (HGSIL), grade 3 CIN, 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   Chronic foot pain, right 03/06/2023   Chronic sinusitis 03/06/2023   HGSIL on Pap smear of cervix 02/12/2023   Visit for routine gyn exam 02/06/2023   PTSD (post-traumatic stress disorder) 10/23/2022   Binge-eating disorder, 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    Perimenopausal vasomotor symptoms 10/03/2022   Elevated blood pressure reading 08/12/2022   Low back pain 06/19/2022   Mixed hyperlipidemia 02/11/2022   Polycythemia 02/11/2022   Screening mammogram for breast cancer 02/11/2022   Annual physical exam 11/18/2021   Rash and nonspecific skin eruption 11/18/2021   Sexuality issues 05/09/2021   Sciatica of right side 08/08/2020   Vitamin D  deficiency 03/20/2020   Fatigue 02/22/2020   Insomnia 01/25/2020   Generalized anxiety disorder with panic attacks 12/29/2019   Left breast mass 12/29/2019   Obesity (BMI 30-39.9) 12/29/2019   Nicotine abuse 12/29/2019   Prehypertension 12/29/2019    Past Surgical History:  Procedure Laterality Date   DILATION AND CURETTAGE OF UTERUS     LEEP  03/24/2023   LEEP N/A 03/24/2023   Procedure: LOOP ELECTROSURGICAL EXCISION PROCEDURE (LEEP);  Surgeon: Ozan, Jennifer, DO;  Location: [facility];  Service: Gynecology;  Laterality: N/A;   TUBAL LIGATION      OB History     Gravida  3   Para  3   Term  3   Preterm      AB      Living  3      SAB      IAB      Ectopic      Multiple      Live Births  Home Medications    Prior to Admission medications   Medication Sig Start Date End Date Taking? Authorizing Provider  nitrofurantoin , macrocrystal-monohydrate, (MACROBID ) 100 MG capsule Take 1 capsule (100 mg total) by mouth 2 (two) times daily. 05/12/24  Yes Leath-Warren, Belen Bowers, NP  acetaminophen  (TYLENOL ) 325 MG tablet Take 2 tablets (650 mg total) by mouth every 6 (six) hours as needed. 03/24/23   Ozan, Jennifer, DO  azelastine  (ASTELIN ) 0.1 % nasal spray Place 2 sprays into both nostrils 2 (two) times daily. Use in each nostril as directed 11/17/22   Zarwolo, Gloria, FNP  busPIRone  (BUSPAR ) 10 MG tablet Take 1 tablet (10 mg total) by mouth 2 (two) times daily. 05/02/24 07/31/24  Zarwolo, Gloria, FNP  hydrocortisone -pramoxine (PROCTOFOAM-HC) rectal foam Place 1 applicator  rectally 2 (two) times daily. Patient not taking: Reported on 03/03/2024 02/25/24   Farris Hong, PA-C  hydrOXYzine  (ATARAX ) 50 MG tablet Take 1 tablet (50 mg total) by mouth 2 (two) times daily as needed for anxiety. 03/17/24   Zarwolo, Gloria, FNP  hydrOXYzine  (VISTARIL ) 100 MG capsule Take 1 capsule (100 mg total) by mouth 2 (two) times daily as needed for itching. 05/02/24   Zarwolo, Gloria, FNP  lamoTRIgine  (LAMICTAL ) 100 MG tablet Take 1 tablet (100 mg total) by mouth daily. 05/02/24   Zarwolo, Gloria, FNP  metFORMIN  (GLUCOPHAGE ) 500 MG tablet Take 1 tablet (500 mg total) by mouth daily. 08/06/23   Zarwolo, Gloria, FNP  ondansetron  (ZOFRAN ) 4 MG tablet Take 1 tablet (4 mg total) by mouth every 8 (eight) hours as needed for nausea or vomiting. 05/02/24   Zarwolo, Gloria, FNP    Family History Family History  Problem Relation Age of Onset   Diabetes Other    Heart disease Mother    Diabetes Maternal Aunt    Cancer Paternal Aunt        lymph node, breast, and ovarian   Breast cancer Paternal Aunt    Diabetes Maternal Grandmother    Heart disease Maternal Grandmother    Heart failure Maternal Grandmother    Heart attack Maternal Grandfather    Cancer Paternal Grandmother        breast cancer   Breast cancer Paternal Grandmother    Cancer Paternal Grandfather        lung cancer    Social History Social History   Tobacco Use   Smoking status: Every Day    Current packs/day: 0.00    Average packs/day: 0.5 packs/day for 5.0 years (2.5 ttl pk-yrs)    Types: Cigarettes    Start date: 05/08/2005    Last attempt to quit: 05/08/2010    Years since quitting: 14.0   Smokeless tobacco: Never  Vaping Use   Vaping status: Never Used  Substance Use Topics   Alcohol use: No   Drug use: No     Allergies   Penicillins   Review of Systems Review of Systems Per HPI  Physical Exam Triage Vital Signs ED Triage Vitals  Encounter Vitals Group     BP 05/12/24 1507 (!) 144/93      Systolic BP Percentile --      Diastolic BP Percentile --      Pulse Rate 05/12/24 1507 (!) 102     Resp 05/12/24 1507 18     Temp 05/12/24 1507 98.3 F (36.8 C)     Temp Source 05/12/24 1507 Oral     SpO2 05/12/24 1507 96 %     Weight --  Height --      Head Circumference --      Peak Flow --      Pain Score 05/12/24 1511 0     Pain Loc --      Pain Education --      Exclude from Growth Chart --    No data found.  Updated Vital Signs BP (!) 144/93 (BP Location: Right Arm)   Pulse (!) 102   Temp 98.3 F (36.8 C) (Oral)   Resp 18   LMP 07/24/2022   SpO2 96%   Visual Acuity Right Eye Distance:   Left Eye Distance:   Bilateral Distance:    Right Eye Near:   Left Eye Near:    Bilateral Near:     Physical Exam Vitals and nursing note reviewed.  Constitutional:      General: She is not in acute distress.    Appearance: Normal appearance.  Eyes:     Extraocular Movements: Extraocular movements intact.     Pupils: Pupils are equal, round, and reactive to light.  Cardiovascular:     Rate and Rhythm: Regular rhythm.     Pulses: Normal pulses.     Heart sounds: Normal heart sounds.  Pulmonary:     Effort: Pulmonary effort is normal. No respiratory distress.     Breath sounds: Normal breath sounds. No stridor. No wheezing, rhonchi or rales.  Abdominal:     General: Bowel sounds are normal.     Palpations: Abdomen is soft.     Tenderness: There is no abdominal tenderness. There is no right CVA tenderness or left CVA tenderness.  Musculoskeletal:     Cervical back: Normal range of motion.  Skin:    General: Skin is warm and dry.  Neurological:     General: No focal deficit present.     Mental Status: She is alert and oriented to person, place, and time.  Psychiatric:        Mood and Affect: Mood normal.        Behavior: Behavior normal.      UC Treatments / Results  Labs (all labs ordered are listed, but only abnormal results are displayed) Labs Reviewed   POCT URINALYSIS DIP (MANUAL ENTRY) - Abnormal; Notable for the following components:      Result Value   Color, UA orange (*)    Glucose, UA =100 (*)    Bilirubin, UA small (*)    Ketones, POC UA trace (5) (*)    Protein Ur, POC =100 (*)    Urobilinogen, UA 2.0 (*)    Nitrite, UA Positive (*)    All other components within normal limits  URINE CULTURE    EKG   Radiology No results found.  Procedures Procedures (including critical care time)  Medications Ordered in UC Medications - No data to display  Initial Impression / Assessment and Plan / UC Course  I have reviewed the triage vital signs and the nursing notes.  Pertinent labs & imaging results that were available during my care of the patient were reviewed by me and considered in my medical decision making (see chart for details).  Urinalysis is positive for nitrites and protein.  Urinalysis and symptoms are consistent with UTI.  Will treat with Macrobid  100 mg twice daily while urine culture is pending.  Supportive care recommendations were provided and discussed with the patient to include fluids, rest, over-the-counter analgesics, developing a toileting schedule, and avoiding caffeine.  Patient was given strict ER  follow-up precautions.  Patient was in agreement with this plan of care and verbalizes understanding.  All questions were answered.  Patient stable for discharge.  Final Clinical Impressions(s) / UC Diagnoses   Final diagnoses:  Acute cystitis without hematuria     Discharge Instructions      A urine culture has been ordered.  You will be contacted if the results of the culture indicate that the medication prescribed today needs to be changed. May take over-the-counter Tylenol  or ibuprofen  as needed for pain, fever, or general discomfort. Try to drink at least 8-10 eight ounce glasses of water daily while symptoms persist. Develop a toileting schedule that will allow you to urinate at least every 2  hours. Avoid caffeine such as tea, soda, and coffee while symptoms persist. Go to the emergency department immediately if you experience worsening urinary symptoms with fever, chills, or other concerns. Follow-up as needed.   ED Prescriptions     Medication Sig Dispense Auth. Provider   nitrofurantoin , macrocrystal-monohydrate, (MACROBID ) 100 MG capsule Take 1 capsule (100 mg total) by mouth 2 (two) times daily. 10 capsule Leath-Warren, Belen Bowers, NP      PDMP not reviewed this encounter.   Hardy Lia, NP 05/12/24 1540    Leath-Warren, Belen Bowers, NP 05/12/24 1540

## 2024-05-12 NOTE — ED Triage Notes (Signed)
 Lower abd and lower back pressure since yesterday.  Started taking AZO yesterday.  Urinary frequency and some burning on urination

## 2024-05-13 LAB — URINE CULTURE: Culture: 60000 — AB

## 2024-05-16 ENCOUNTER — Other Ambulatory Visit: Payer: Self-pay | Admitting: Otolaryngology

## 2024-05-16 ENCOUNTER — Ambulatory Visit (HOSPITAL_COMMUNITY): Payer: Self-pay

## 2024-05-16 DIAGNOSIS — M79672 Pain in left foot: Secondary | ICD-10-CM

## 2024-05-16 DIAGNOSIS — S92352A Displaced fracture of fifth metatarsal bone, left foot, initial encounter for closed fracture: Secondary | ICD-10-CM | POA: Diagnosis not present

## 2024-05-17 ENCOUNTER — Ambulatory Visit
Admission: RE | Admit: 2024-05-17 | Discharge: 2024-05-17 | Disposition: A | Discharge status: Home / Self Care | Source: Ambulatory Visit | Attending: Otolaryngology | Admitting: Otolaryngology

## 2024-05-17 DIAGNOSIS — M79672 Pain in left foot: Secondary | ICD-10-CM

## 2024-05-17 DIAGNOSIS — S92355A Nondisplaced fracture of fifth metatarsal bone, left foot, initial encounter for closed fracture: Secondary | ICD-10-CM | POA: Diagnosis not present

## 2024-05-19 DIAGNOSIS — S92352A Displaced fracture of fifth metatarsal bone, left foot, initial encounter for closed fracture: Secondary | ICD-10-CM | POA: Diagnosis not present

## 2024-05-23 ENCOUNTER — Telehealth: Admitting: Family Medicine

## 2024-05-23 DIAGNOSIS — R399 Unspecified symptoms and signs involving the genitourinary system: Secondary | ICD-10-CM | POA: Diagnosis not present

## 2024-05-23 MED ORDER — SULFAMETHOXAZOLE-TRIMETHOPRIM 800-160 MG PO TABS: 1.0000 | ORAL_TABLET | Freq: Two times a day (BID) | ORAL | 0 refills | Status: AC

## 2024-05-23 NOTE — Progress Notes (Signed)
E-Visit for Urinary Problems  We are sorry that you are not feeling well.  Here is how we plan to help!  Based on what you shared with me it looks like you most likely have a simple urinary tract infection.  A UTI (Urinary Tract Infection) is a bacterial infection of the bladder.  Most cases of urinary tract infections are simple to treat but a key part of your care is to encourage you to drink plenty of fluids and watch your symptoms carefully.  I have prescribed Bactrim DS One tablet twice a day for 5 days.  Your symptoms should gradually improve. Call us if the burning in your urine worsens, you develop worsening fever, back pain or pelvic pain or if your symptoms do not resolve after completing the antibiotic.  Urinary tract infections can be prevented by drinking plenty of water to keep your body hydrated.  Also be sure when you wipe, wipe from front to back and don't hold it in!  If possible, empty your bladder every 4 hours.  HOME CARE Drink plenty of fluids Compete the full course of the antibiotics even if the symptoms resolve Remember, when you need to go.go. Holding in your urine can increase the likelihood of getting a UTI! GET HELP RIGHT AWAY IF: You cannot urinate You get a high fever Worsening back pain occurs You see blood in your urine You feel sick to your stomach or throw up You feel like you are going to pass out  MAKE SURE YOU  Understand these instructions. Will watch your condition. Will get help right away if you are not doing well or get worse.   Thank you for choosing an e-visit.  Your e-visit answers were reviewed by a board certified advanced clinical practitioner to complete your personal care plan. Depending upon the condition, your plan could have included both over the counter or prescription medications.  Please review your pharmacy choice. Make sure the pharmacy is open so you can pick up prescription now. If there is a problem, you may contact  your provider through [employer] and have the prescription routed to another pharmacy.  Your safety is important to [place]. If you have drug allergies check your prescription carefully.   For the next 24 hours you can use MyChart to ask questions about today's visit, request a non-urgent call back, or ask for a work or school excuse. You will get an email in the next two days asking about your experience. I hope that your e-visit has been valuable and will speed your recovery. I have spent 5 minutes in review of e-visit questionnaire, review and updating patient chart, medical decision making and response to patient.   Reed Pandy, PA-C

## 2024-06-03 DIAGNOSIS — S92352A Displaced fracture of fifth metatarsal bone, left foot, initial encounter for closed fracture: Secondary | ICD-10-CM | POA: Diagnosis not present

## 2024-06-13 ENCOUNTER — Telehealth: Admitting: Physician Assistant

## 2024-06-13 ENCOUNTER — Ambulatory Visit
Admission: RE | Admit: 2024-06-13 | Discharge: 2024-06-13 | Disposition: A | Discharge status: Home / Self Care | Source: Ambulatory Visit | Attending: Nurse Practitioner | Admitting: Nurse Practitioner

## 2024-06-13 VITALS — BP 137/94 | HR 101 | Temp 98.1°F | Resp 20

## 2024-06-13 DIAGNOSIS — R1030 Lower abdominal pain, unspecified: Secondary | ICD-10-CM | POA: Insufficient documentation

## 2024-06-13 DIAGNOSIS — R030 Elevated blood-pressure reading, without diagnosis of hypertension: Secondary | ICD-10-CM | POA: Insufficient documentation

## 2024-06-13 DIAGNOSIS — N390 Urinary tract infection, site not specified: Secondary | ICD-10-CM | POA: Diagnosis not present

## 2024-06-13 DIAGNOSIS — Z880 Allergy status to penicillin: Secondary | ICD-10-CM | POA: Insufficient documentation

## 2024-06-13 DIAGNOSIS — R350 Frequency of micturition: Secondary | ICD-10-CM | POA: Diagnosis present

## 2024-06-13 DIAGNOSIS — Z5321 Procedure and treatment not carried out due to patient leaving prior to being seen by health care provider: Secondary | ICD-10-CM

## 2024-06-13 DIAGNOSIS — R000 Tachycardia, unspecified: Secondary | ICD-10-CM | POA: Diagnosis not present

## 2024-06-13 LAB — POCT URINALYSIS DIP (MANUAL ENTRY)
Glucose, UA: 250 mg/dL — AB
Nitrite, UA: POSITIVE — AB
Protein Ur, POC: 100 mg/dL — AB
Spec Grav, UA: <=1.005 — AB (ref 1.010–1.025)
Urobilinogen, UA: 4 U/dL — AB
pH, UA: 5 (ref 5.0–8.0)

## 2024-06-13 MED ORDER — CEFPODOXIME PROXETIL 200 MG PO TABS: 200.0000 mg | ORAL_TABLET | Freq: Two times a day (BID) | ORAL | 0 refills | Status: AC

## 2024-06-13 NOTE — Discharge Instructions (Signed)
 Take the Vantin twice daily for 7 days as prescribed.  We will contact you if the urine culture shows we need to change the antibiotic.  Increase water intake.  Follow-up with urology if symptoms do not improve with treatment.  If symptoms worsen, seek care in emergency room.

## 2024-06-13 NOTE — ED Triage Notes (Addendum)
 Pt reports she has low back pain, bladder fullness, low abdominal pain, and strong smelling urine xx 1 month   States none of the anitbiotics she has been given have worked.   Pt took azo

## 2024-06-13 NOTE — ED Provider Notes (Signed)
 [facility]    CSN: [id number] Arrival date & time: 06/13/24  1408      History   Chief Complaint Chief Complaint  Patient presents with   Urinary Frequency    I still have an uti after taking 2 different antibiotics - Entered by patient    HPI Jacqueline [name] is a 48 y.o. female.   Patient presents today with 1 month history of urinary frequency, voiding smaller amounts, intermittent urinary urgency, foul urinary odor, lower left abdominal pain, left low back pain.  She denies dysuria, new urinary incontinence, hematuria, fever, vomiting, change in appetite, and vaginal discharge.  She was initially seen on 05/12/2024 and treated with Macrobid , reports symptoms improved but then recurred and did an e-visit, was treated with Bactrim .  Again, symptoms seem to improve but not all of the way.  She has taken Azo which does help with symptoms significantly.  Patient reports history of rash and nausea/vomiting with penicillin antibiotics.  She has taken cefdinir  in the past for an ear infection and has tolerated it well.    Past Medical History:  Diagnosis Date   Annual visit for general adult medical examination with abnormal findings 02/22/2020   Anxiety    Depression, major, single episode, moderate (HCC) 12/29/2019   Encounter for TB tine test 02/22/2020   GERD (gastroesophageal reflux disease)    Kidney stones    hx UTI   Need for immunization against influenza 12/29/2019   Sinusitis    URI (upper respiratory infection)    UTI (lower urinary tract infection)    Vaginal Pap smear, abnormal     Patient Active Problem List   Diagnosis Date Noted   Hemorrhoids 03/06/2024   Muscle strain 10/04/2023   Viral illness 07/31/2023   High grade squamous intraepithelial lesion (HGSIL), grade 3 CIN, 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   Chronic foot pain, right 03/06/2023   Chronic sinusitis 03/06/2023   HGSIL on Pap smear of cervix 02/12/2023   Visit for routine gyn exam  02/06/2023   PTSD (post-traumatic stress disorder) 10/23/2022   Binge-eating disorder, 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   Perimenopausal vasomotor symptoms 10/03/2022   Elevated blood pressure reading 08/12/2022   Low back pain 06/19/2022   Mixed hyperlipidemia 02/11/2022   Polycythemia 02/11/2022   Screening mammogram for breast cancer 02/11/2022   Annual physical exam 11/18/2021   Rash and nonspecific skin eruption 11/18/2021   Sexuality issues 05/09/2021   Sciatica of right side 08/08/2020   Vitamin D  deficiency 03/20/2020   Fatigue 02/22/2020   Insomnia 01/25/2020   Generalized anxiety disorder with panic attacks 12/29/2019   Left breast mass 12/29/2019   Obesity (BMI 30-39.9) 12/29/2019   Nicotine abuse 12/29/2019   Prehypertension 12/29/2019    Past Surgical History:  Procedure Laterality Date   DILATION AND CURETTAGE OF UTERUS     LEEP  03/24/2023   LEEP N/A 03/24/2023   Procedure: LOOP ELECTROSURGICAL EXCISION PROCEDURE (LEEP);  Surgeon: Ozan, Jennifer, DO;  Location: [facility];  Service: Gynecology;  Laterality: N/A;   TUBAL LIGATION      OB History     Gravida  3   Para  3   Term  3   Preterm      AB      Living  3      SAB      IAB      Ectopic      Multiple      Live Births  Home Medications    Prior to Admission medications   Medication Sig Start Date End Date Taking? Authorizing Provider  cefpodoxime (VANTIN) 200 MG tablet Take 1 tablet (200 mg total) by mouth 2 (two) times daily for 7 days. 06/13/24 06/20/24 Yes Wilhemena Harbour, NP  meloxicam (MOBIC) 7.5 MG tablet Take 1 tablet PO once a day PRN 05/19/24  Yes [provider]  acetaminophen  (TYLENOL ) 325 MG tablet Take 2 tablets (650 mg total) by mouth every 6 (six) hours as needed. 03/24/23   Ozan, Jennifer, DO  azelastine  (ASTELIN ) 0.1 % nasal spray Place 2 sprays into both nostrils 2 (two) times daily. Use in each nostril as directed 11/17/22   Zarwolo, Gloria, FNP  busPIRone   (BUSPAR ) 10 MG tablet Take 1 tablet (10 mg total) by mouth 2 (two) times daily. 05/02/24 07/31/24  Zarwolo, Gloria, FNP  hydrocortisone -pramoxine (PROCTOFOAM-HC) rectal foam Place 1 applicator rectally 2 (two) times daily. Patient not taking: Reported on 03/03/2024 02/25/24   Farris Hong, PA-C  hydrOXYzine  (ATARAX ) 50 MG tablet Take 1 tablet (50 mg total) by mouth 2 (two) times daily as needed for anxiety. 03/17/24   Zarwolo, Gloria, FNP  hydrOXYzine  (VISTARIL ) 100 MG capsule Take 1 capsule (100 mg total) by mouth 2 (two) times daily as needed for itching. 05/02/24   Zarwolo, Gloria, FNP  lamoTRIgine  (LAMICTAL ) 100 MG tablet Take 1 tablet (100 mg total) by mouth daily. 05/02/24   Zarwolo, Gloria, FNP  metFORMIN  (GLUCOPHAGE ) 500 MG tablet Take 1 tablet (500 mg total) by mouth daily. 08/06/23   Zarwolo, Gloria, FNP  nitrofurantoin , macrocrystal-monohydrate, (MACROBID ) 100 MG capsule Take 1 capsule (100 mg total) by mouth 2 (two) times daily. 05/12/24   Leath-Warren, Belen Bowers, NP  ondansetron  (ZOFRAN ) 4 MG tablet Take 1 tablet (4 mg total) by mouth every 8 (eight) hours as needed for nausea or vomiting. 05/02/24   Zarwolo, Gloria, FNP    Family History Family History  Problem Relation Age of Onset   Diabetes Other    Heart disease Mother    Diabetes Maternal Aunt    Cancer Paternal Aunt        lymph node, breast, and ovarian   Breast cancer Paternal Aunt    Diabetes Maternal Grandmother    Heart disease Maternal Grandmother    Heart failure Maternal Grandmother    Heart attack Maternal Grandfather    Cancer Paternal Grandmother        breast cancer   Breast cancer Paternal Grandmother    Cancer Paternal Grandfather        lung cancer    Social History Social History   Tobacco Use   Smoking status: Every Day    Current packs/day: 0.00    Average packs/day: 0.5 packs/day for 5.0 years (2.5 ttl pk-yrs)    Types: Cigarettes    Start date: 05/08/2005    Last attempt to quit: 05/08/2010    Years  since quitting: 14.1   Smokeless tobacco: Never  Vaping Use   Vaping status: Never Used  Substance Use Topics   Alcohol use: No   Drug use: No     Allergies   Penicillins   Review of Systems Review of Systems Per HPI  Physical Exam Triage Vital Signs ED Triage Vitals  Encounter Vitals Group     BP 06/13/24 1444 (!) 137/94     Girls Systolic BP Percentile --      Girls Diastolic BP Percentile --      Boys  Systolic BP Percentile --      Boys Diastolic BP Percentile --      Pulse Rate 06/13/24 1444 (!) 101     Resp 06/13/24 1444 20     Temp 06/13/24 1444 98.1 F (36.7 C)     Temp Source 06/13/24 1444 Oral     SpO2 06/13/24 1444 94 %     Weight --      Height --      Head Circumference --      Peak Flow --      Pain Score 06/13/24 1442 0     Pain Loc --      Pain Education --      Exclude from Growth Chart --    No data found.  Updated Vital Signs BP (!) 137/94 (BP Location: Right Arm)   Pulse (!) 101   Temp 98.1 F (36.7 C) (Oral)   Resp 20   LMP 07/24/2022   SpO2 94%   Visual Acuity Right Eye Distance:   Left Eye Distance:   Bilateral Distance:    Right Eye Near:   Left Eye Near:    Bilateral Near:     Physical Exam Vitals and nursing note reviewed.  Constitutional:      General: She is not in acute distress.    Appearance: She is not toxic-appearing.  HENT:     Mouth/Throat:     Mouth: Mucous membranes are moist.     Pharynx: Oropharynx is clear.  Pulmonary:     Effort: Pulmonary effort is normal. No respiratory distress.  Abdominal:     General: Abdomen is flat. Bowel sounds are normal. There is no distension.     Palpations: Abdomen is soft. There is no mass.     Tenderness: There is no abdominal tenderness. There is no right CVA tenderness, left CVA tenderness or guarding.   Skin:    General: Skin is warm and dry.     Coloration: Skin is not jaundiced or pale.     Findings: No erythema.   Neurological:     Mental Status: She is  alert and oriented to person, place, and time.     Motor: No weakness.     Gait: Gait normal.   Psychiatric:        Behavior: Behavior is cooperative.      UC Treatments / Results  Labs (all labs ordered are listed, but only abnormal results are displayed) Labs Reviewed  POCT URINALYSIS DIP (MANUAL ENTRY) - Abnormal; Notable for the following components:      Result Value   Color, UA red (*)    Clarity, UA cloudy (*)    Glucose, UA =250 (*)    Bilirubin, UA small (*)    Ketones, POC UA moderate (40) (*)    Spec Grav, UA <=1.005 (*)    Blood, UA trace-intact (*)    Protein Ur, POC =100 (*)    Urobilinogen, UA 4.0 (*)    Nitrite, UA Positive (*)    Leukocytes, UA Large (3+) (*)    All other components within normal limits  URINE CULTURE    EKG   Radiology No results found.  Procedures Procedures (including critical care time)  Medications Ordered in UC Medications - No data to display  Initial Impression / Assessment and Plan / UC Course  I have reviewed the triage vital signs and the nursing notes.  Pertinent labs & imaging results that were available during my care of  the patient were reviewed by me and considered in my medical decision making (see chart for details).   Patient is mildly hypertensive and tachycardic in triage today, otherwise vital signs are stable.  1. Lower abdominal pain 2. Complicated UTI (urinary tract infection) Urinalysis today is unreliable secondary to Azo use Vital signs are stable Chart review performed-most recent positive urine culture showed Corynebacterium diptheroids and susceptibility testing was not performed Will treat patient for complicated UTI with Vantin 200 mg twice daily for 7 days while urine culture is pending If symptoms do not fully improve or if they recur, recommended follow-up with urology and contact formation provided  The patient was given the opportunity to ask questions.  All questions answered to their  satisfaction.  The patient is in agreement to this plan.   Final Clinical Impressions(s) / UC Diagnoses   Final diagnoses:  Lower abdominal pain  Complicated UTI (urinary tract infection)     Discharge Instructions      Take the Vantin twice daily for 7 days as prescribed.  We will contact you if the urine culture shows we need to change the antibiotic.  Increase water intake.  Follow-up with urology if symptoms do not improve with treatment.  If symptoms worsen, seek care in emergency room.     ED Prescriptions     Medication Sig Dispense Auth. Provider   cefpodoxime (VANTIN) 200 MG tablet Take 1 tablet (200 mg total) by mouth 2 (two) times daily for 7 days. 14 tablet Wilhemena Harbour, NP      PDMP not reviewed this encounter.   Wilhemena Harbour, NP 06/13/24 [phone]

## 2024-06-13 NOTE — Progress Notes (Signed)
 Patient advised to be seen in person for a repeat Urine Culture due to failing 2 different antibiotics (Macrobid  and Bactrim  x 5 days each).  Will mark no charge.

## 2024-06-14 LAB — URINE CULTURE

## 2024-06-16 ENCOUNTER — Ambulatory Visit (HOSPITAL_COMMUNITY): Payer: Self-pay

## 2024-06-17 ENCOUNTER — Ambulatory Visit

## 2024-06-18 ENCOUNTER — Ambulatory Visit
Admission: RE | Admit: 2024-06-18 | Discharge: 2024-06-18 | Disposition: A | Discharge status: Home / Self Care | Source: Ambulatory Visit | Attending: Internal Medicine | Admitting: Internal Medicine

## 2024-06-18 VITALS — BP 141/97 | Temp 98.5°F | Resp 18

## 2024-06-18 DIAGNOSIS — R350 Frequency of micturition: Secondary | ICD-10-CM | POA: Diagnosis present

## 2024-06-18 NOTE — ED Triage Notes (Signed)
 Pt reports she is still having bladder pressure, low abdominal pain, and low back pain back since her last visit on 6/16. Has returned to [facility] for a urine recollect.

## 2024-06-20 LAB — URINE CULTURE

## 2024-06-21 ENCOUNTER — Ambulatory Visit (HOSPITAL_COMMUNITY): Payer: Self-pay

## 2024-06-22 ENCOUNTER — Telehealth (HOSPITAL_COMMUNITY): Payer: Self-pay

## 2024-06-22 ENCOUNTER — Telehealth: Payer: Self-pay | Admitting: Emergency Medicine

## 2024-06-22 ENCOUNTER — Encounter: Payer: Self-pay | Admitting: Family Medicine

## 2024-06-22 MED ORDER — NITROFURANTOIN MONOHYD MACRO 100 MG PO CAPS: 100.0000 mg | ORAL_CAPSULE | Freq: Two times a day (BID) | ORAL | 0 refills | Status: AC

## 2024-06-22 NOTE — Telephone Encounter (Signed)
 Pt signed up for UC appointment on 6/26. Provider reviewed pt chart and reports has been seen at [facility] for similar several times and have reached the capacity of UC resources. Pt needs to follow-up with urology or PCP.  Discussed with pt and reported spoke with UC care team early today and was told to come back to UC and have cytology swab obtained. Pt reports current symptoms are abdominal pressure, urine odor, urgency. Discussed with provider and reported vaginal swab is not indicated for those symptoms. Pt verbalized understanding of recommendations and reported would follow-up with pcp/urologist.

## 2024-06-22 NOTE — Telephone Encounter (Signed)
 Pt called regarding script for Macrobid . Pt has been treated with in the last 2 months with same bacteria culture.  This RN spoke with FREDRIK Search, PA-C, who stated diphtheroids does not often cause UTI. Advised pt f/u with Ob/gyn or return to UC for vaginal swab. TC to pt to advise of PA recs. Assisted with setting up appt for tomorrow.  Of note, pt states chief c/o is lower back/flank pain and abd pressure.

## 2024-06-23 ENCOUNTER — Ambulatory Visit (INDEPENDENT_AMBULATORY_CARE_PROVIDER_SITE_OTHER): Admitting: Family Medicine

## 2024-06-23 ENCOUNTER — Ambulatory Visit

## 2024-06-23 ENCOUNTER — Ambulatory Visit: Payer: Self-pay

## 2024-06-23 VITALS — BP 157/85 | HR 101 | Ht 60.0 in | Wt 191.0 lb

## 2024-06-23 DIAGNOSIS — N390 Urinary tract infection, site not specified: Secondary | ICD-10-CM

## 2024-06-23 DIAGNOSIS — R109 Unspecified abdominal pain: Secondary | ICD-10-CM | POA: Diagnosis not present

## 2024-06-23 DIAGNOSIS — Z1211 Encounter for screening for malignant neoplasm of colon: Secondary | ICD-10-CM | POA: Diagnosis not present

## 2024-06-23 DIAGNOSIS — R399 Unspecified symptoms and signs involving the genitourinary system: Secondary | ICD-10-CM | POA: Diagnosis not present

## 2024-06-23 NOTE — Progress Notes (Signed)
 Established Patient Office Visit   Subjective  Patient ID: Jacqueline [name], female    DOB: 01/04/1976  Age: 48 y.o. MRN: [id number]  Chief Complaint  Patient presents with   Urinary Tract Infection    Seen at urgent care, has been on three different antibiotics, symptoms are not improving. Has recently changed body wash and detergent     She  has a past medical history of Annual visit for general adult medical examination with abnormal findings (02/22/2020), Anxiety, Depression, major, single episode, moderate (HCC) (12/29/2019), Encounter for TB tine test (02/22/2020), GERD (gastroesophageal reflux disease), Kidney stones, Need for immunization against influenza (12/29/2019), Sinusitis, URI (upper respiratory infection), UTI (lower urinary tract infection), and Vaginal Pap smear, abnormal.  Urinary Tract Infection This is a recurrent condition characterized by symptoms that comes and goes with no improvement the last few weeks.. The pain is described as aching and is currently rated 4 out of 10 in severity. She has no fever and is not sexually active. She has a history of pyelonephritis. Associated symptoms include flank pain, urinary frequency, nausea, and urgency. She denies any hematuria. She has previously been treated with antibiotics, including Bactrim  and nitrofurantoin , without symptom relief. Her past medical history is significant for kidney stones and recurrent urinary tract infections.    Review of Systems  Constitutional:  Negative for chills and fever.  Respiratory:  Negative for cough.   Cardiovascular:  Negative for chest pain.  Gastrointestinal:  Positive for nausea.  Genitourinary:  Positive for dysuria, flank pain, frequency, hematuria and urgency.      Objective:     BP (!) 157/85   Pulse (!) 101   Ht 5' (1.524 m)   Wt 191 lb (86.6 kg)   LMP 07/24/2022   SpO2 96%   BMI 37.30 kg/m  BP Readings from Last 3 Encounters:  06/23/24 (!) 157/85  06/18/24 (!) 141/97   06/13/24 (!) 137/94      Physical Exam Vitals reviewed.  Constitutional:      General: She is not in acute distress.    Appearance: Normal appearance. She is not ill-appearing, toxic-appearing or diaphoretic.  HENT:     Head: Normocephalic.   Eyes:     General:        Right eye: No discharge.        Left eye: No discharge.     Conjunctiva/sclera: Conjunctivae normal.    Cardiovascular:     Rate and Rhythm: Normal rate.     Pulses: Normal pulses.     Heart sounds: Normal heart sounds.  Pulmonary:     Effort: Pulmonary effort is normal. No respiratory distress.     Breath sounds: Normal breath sounds.  Abdominal:     General: Bowel sounds are normal.     Palpations: Abdomen is soft.     Tenderness: There is no abdominal tenderness. There is right CVA tenderness. There is no left CVA tenderness or guarding.   Skin:    General: Skin is warm and dry.     Capillary Refill: Capillary refill takes less than 2 seconds.   Neurological:     Mental Status: She is alert.   Psychiatric:        Mood and Affect: Mood normal.        Behavior: Behavior normal.      No results found for any visits on 06/23/24.  The 10-year ASCVD risk score (Arnett DK, et al., 2019) is: 9.3%    Assessment &  Plan:  Urinary symptom or sign -     Urinalysis -     Urine Culture  Left flank pain -     US  RENAL; Future  Recurrent UTI Assessment & Plan: Recurrent urinary tract infection with no improvement after treatment with Bactrim  and nitrofurantoin . History of kidney stones and pyelonephritis. Renal ultrasound, urinalysis, and urine culture have been ordered. Referral to urology placed for further evaluation. Advise pt to visit emergency department for worsening symptoms. Discussed managing a UTI at home, it's important to drink plenty of water to help flush out bacteria from your urinary tract. Make sure to urinate frequently and avoid holding your urine. After using the bathroom, always  wipe from front to back to prevent bacteria from spreading. Avoid irritants like caffeine, alcohol, spicy foods, and artificial sweeteners, as they can aggravate your bladder. Wearing loose, breathable clothing, especially cotton underwear, can help keep the area dry and reduce bacterial growth. If symptoms persist or worsen follow up.   Orders: -     Ambulatory referral to Urology  Screen for colon cancer -     Cologuard    Return if symptoms worsen or fail to improve.   [name], FNP

## 2024-06-23 NOTE — Assessment & Plan Note (Signed)
 Recurrent urinary tract infection with no improvement after treatment with Bactrim  and nitrofurantoin . History of kidney stones and pyelonephritis. Renal ultrasound, urinalysis, and urine culture have been ordered. Referral to urology placed for further evaluation. Advise pt to visit emergency department for worsening symptoms. Discussed managing a UTI at home, it's important to drink plenty of water to help flush out bacteria from your urinary tract. Make sure to urinate frequently and avoid holding your urine. After using the bathroom, always wipe from front to back to prevent bacteria from spreading. Avoid irritants like caffeine, alcohol, spicy foods, and artificial sweeteners, as they can aggravate your bladder. Wearing loose, breathable clothing, especially cotton underwear, can help keep the area dry and reduce bacterial growth. If symptoms persist or worsen follow up.

## 2024-06-23 NOTE — Patient Instructions (Signed)

## 2024-06-25 LAB — URINALYSIS
Bilirubin, UA: NEGATIVE
Glucose, UA: NEGATIVE
Ketones, UA: NEGATIVE
Leukocytes,UA: NEGATIVE
Nitrite, UA: NEGATIVE
Protein,UA: NEGATIVE
RBC, UA: NEGATIVE
Specific Gravity, UA: 1.02 (ref 1.005–1.030)
Urobilinogen, Ur: 0.2 mg/dL (ref 0.2–1.0)
pH, UA: 7 (ref 5.0–7.5)

## 2024-06-25 LAB — URINE CULTURE: Organism ID, Bacteria: NO GROWTH

## 2024-06-28 ENCOUNTER — Encounter: Payer: Self-pay | Admitting: Family Medicine

## 2024-06-28 NOTE — Telephone Encounter (Signed)
 If patient still having symptoms I advise continue to with Ultrasound appointment

## 2024-07-04 ENCOUNTER — Ambulatory Visit (HOSPITAL_COMMUNITY)

## 2024-07-13 ENCOUNTER — Ambulatory Visit (HOSPITAL_COMMUNITY)
Admission: RE | Admit: 2024-07-13 | Discharge: 2024-07-13 | Disposition: A | Discharge status: Home / Self Care | Source: Ambulatory Visit | Attending: Family Medicine | Admitting: Family Medicine

## 2024-07-13 ENCOUNTER — Encounter: Payer: Self-pay | Admitting: Family Medicine

## 2024-07-13 DIAGNOSIS — R109 Unspecified abdominal pain: Secondary | ICD-10-CM | POA: Diagnosis not present

## 2024-07-17 ENCOUNTER — Encounter: Payer: Self-pay | Admitting: Family Medicine

## 2024-07-17 NOTE — Telephone Encounter (Signed)
 Kindly place both orders

## 2024-07-18 ENCOUNTER — Other Ambulatory Visit: Payer: Self-pay

## 2024-07-18 DIAGNOSIS — Z13820 Encounter for screening for osteoporosis: Secondary | ICD-10-CM

## 2024-07-18 DIAGNOSIS — R350 Frequency of micturition: Secondary | ICD-10-CM

## 2024-07-18 NOTE — Telephone Encounter (Signed)
 Orders for both have been placed

## 2024-07-21 ENCOUNTER — Ambulatory Visit: Payer: Self-pay | Admitting: Family Medicine

## 2024-07-21 NOTE — Telephone Encounter (Signed)
 Its sounds good no concerns at this time

## 2024-07-26 ENCOUNTER — Other Ambulatory Visit (HOSPITAL_COMMUNITY): Payer: Self-pay | Admitting: Family Medicine

## 2024-07-26 DIAGNOSIS — Z1231 Encounter for screening mammogram for malignant neoplasm of breast: Secondary | ICD-10-CM

## 2024-07-29 ENCOUNTER — Ambulatory Visit (HOSPITAL_COMMUNITY)
Admission: RE | Admit: 2024-07-29 | Discharge: 2024-07-29 | Disposition: A | Discharge status: Home / Self Care | Source: Ambulatory Visit | Attending: Family Medicine | Admitting: Family Medicine

## 2024-07-29 DIAGNOSIS — Z780 Asymptomatic menopausal state: Secondary | ICD-10-CM | POA: Diagnosis not present

## 2024-07-29 DIAGNOSIS — Z13820 Encounter for screening for osteoporosis: Secondary | ICD-10-CM | POA: Insufficient documentation

## 2024-07-29 DIAGNOSIS — M8589 Other specified disorders of bone density and structure, multiple sites: Secondary | ICD-10-CM | POA: Diagnosis not present

## 2024-08-04 ENCOUNTER — Ambulatory Visit: Payer: Self-pay | Admitting: Family Medicine

## 2024-08-08 ENCOUNTER — Encounter (HOSPITAL_COMMUNITY): Payer: Self-pay

## 2024-08-08 ENCOUNTER — Ambulatory Visit (HOSPITAL_COMMUNITY)
Admission: RE | Admit: 2024-08-08 | Discharge: 2024-08-08 | Disposition: A | Discharge status: Home / Self Care | Source: Ambulatory Visit | Attending: Family Medicine | Admitting: Family Medicine

## 2024-08-08 DIAGNOSIS — Z1231 Encounter for screening mammogram for malignant neoplasm of breast: Secondary | ICD-10-CM | POA: Insufficient documentation

## 2024-08-15 ENCOUNTER — Ambulatory Visit: Payer: Self-pay | Admitting: Urology

## 2024-08-22 ENCOUNTER — Ambulatory Visit: Admitting: Urology

## 2024-09-04 ENCOUNTER — Encounter: Payer: Self-pay | Admitting: Family Medicine

## 2024-09-05 ENCOUNTER — Other Ambulatory Visit: Payer: Self-pay

## 2024-09-05 DIAGNOSIS — F5104 Psychophysiologic insomnia: Secondary | ICD-10-CM

## 2024-09-05 DIAGNOSIS — F410 Panic disorder [episodic paroxysmal anxiety] without agoraphobia: Secondary | ICD-10-CM

## 2024-09-05 MED ORDER — LAMOTRIGINE 100 MG PO TABS: 100.0000 mg | ORAL_TABLET | Freq: Every day | ORAL | 1 refills | Status: DC

## 2024-09-05 MED ORDER — BUSPIRONE HCL 10 MG PO TABS: 10.0000 mg | ORAL_TABLET | Freq: Two times a day (BID) | ORAL | 1 refills | Status: AC

## 2024-09-05 NOTE — Telephone Encounter (Signed)
 Refills sent to pharmacy.

## 2024-09-16 ENCOUNTER — Ambulatory Visit: Admitting: Urology

## 2024-09-20 ENCOUNTER — Other Ambulatory Visit: Payer: Self-pay | Admitting: Family Medicine

## 2024-09-20 DIAGNOSIS — F410 Panic disorder [episodic paroxysmal anxiety] without agoraphobia: Secondary | ICD-10-CM

## 2024-09-20 DIAGNOSIS — F5104 Psychophysiologic insomnia: Secondary | ICD-10-CM

## 2024-11-02 ENCOUNTER — Ambulatory Visit: Admitting: Internal Medicine

## 2024-11-30 ENCOUNTER — Ambulatory Visit (HOSPITAL_COMMUNITY): Attending: Urology

## 2024-11-30 ENCOUNTER — Ambulatory Visit: Admitting: Urology

## 2024-11-30 ENCOUNTER — Encounter (HOSPITAL_COMMUNITY): Payer: Self-pay

## 2024-11-30 ENCOUNTER — Encounter: Payer: Self-pay | Admitting: Urology

## 2024-11-30 VITALS — BP 156/86 | HR 108

## 2024-11-30 DIAGNOSIS — Z09 Encounter for follow-up examination after completed treatment for conditions other than malignant neoplasm: Secondary | ICD-10-CM

## 2024-11-30 DIAGNOSIS — N200 Calculus of kidney: Secondary | ICD-10-CM

## 2024-11-30 DIAGNOSIS — R350 Frequency of micturition: Secondary | ICD-10-CM | POA: Diagnosis not present

## 2024-11-30 DIAGNOSIS — Z87448 Personal history of other diseases of urinary system: Secondary | ICD-10-CM | POA: Diagnosis not present

## 2024-11-30 LAB — URINALYSIS, ROUTINE W REFLEX MICROSCOPIC
Bilirubin, UA: NEGATIVE
Glucose, UA: NEGATIVE
Ketones, UA: NEGATIVE
Leukocytes,UA: NEGATIVE
Nitrite, UA: NEGATIVE
Protein,UA: NEGATIVE
RBC, UA: NEGATIVE
Specific Gravity, UA: 1.015 (ref 1.005–1.030)
Urobilinogen, Ur: 0.2 mg/dL (ref 0.2–1.0)
pH, UA: 8 — ABNORMAL HIGH (ref 5.0–7.5)

## 2024-11-30 LAB — BLADDER SCAN AMB NON-IMAGING: Scan Result: 36

## 2024-11-30 NOTE — Progress Notes (Signed)
 Bladder Scan completed today due to reason Frequent urinary   Patient can void prior to the bladder scan. Bladder scan result: 36  Performed By: Carlos, CMA  Additional notes- Patient is scheduled to follow up with  N/A

## 2024-11-30 NOTE — Progress Notes (Signed)
 11/30/2024 9:15 AM   Jacqueline [name] 05-14-1976 [id number]  Referring provider: Edman Meade PEDLAR, FNP [address] [place],  [place] [address]  Urinary frequency   HPI: Jacqueline [name] is a 48yo here for evaluation of urinary frequency. 5 months ago she had a UTI and developed lower back pain at that time. After treating the UTI the back pain did not improve. She has a history of nephrolithiasis when she was in her 31s. She underwent renal US  06/2024 which showed mild left hydronephrosis. Urine cultures from 05/2024 shows multiple species. She did not see improvement with 3 courses of antibiotics. She continues to have urinary frequency and urgency.    PMH: Past Medical History:  Diagnosis Date   Annual visit for general adult medical examination with abnormal findings 02/22/2020   Anxiety    Depression, major, single episode, moderate (HCC) 12/29/2019   Encounter for TB tine test 02/22/2020   GERD (gastroesophageal reflux disease)    Kidney stones    hx UTI   Need for immunization against influenza 12/29/2019   Sinusitis    URI (upper respiratory infection)    UTI (lower urinary tract infection)    Vaginal Pap smear, abnormal     Surgical History: Past Surgical History:  Procedure Laterality Date   DILATION AND CURETTAGE OF UTERUS     LEEP  03/24/2023   LEEP N/A 03/24/2023   Procedure: LOOP ELECTROSURGICAL EXCISION PROCEDURE (LEEP);  Surgeon: Ozan, Jennifer, DO;  Location: [facility];  Service: Gynecology;  Laterality: N/A;   TUBAL LIGATION      Home Medications:  Allergies as of 11/30/2024       Reactions   Penicillins Nausea And Vomiting, Rash        Medication List        Accurate as of November 30, 2024  9:15 AM. If you have any questions, ask your nurse or doctor.          acetaminophen  325 MG tablet Commonly known as: Tylenol  Take 2 tablets (650 mg total) by mouth every 6 (six) hours as needed.   azelastine  0.1 % nasal spray Commonly known as: ASTELIN  Place  2 sprays into both nostrils 2 (two) times daily. Use in each nostril as directed   busPIRone  10 MG tablet Commonly known as: BUSPAR  Take 1 tablet (10 mg total) by mouth 2 (two) times daily.   hydrocortisone -pramoxine rectal foam Commonly known as: PROCTOFOAM-HC Place 1 applicator rectally 2 (two) times daily.   hydrOXYzine  100 MG capsule Commonly known as: VISTARIL  Take 1 capsule (100 mg total) by mouth 2 (two) times daily as needed for itching.   hydrOXYzine  50 MG tablet Commonly known as: ATARAX  Take 1 tablet (50 mg total) by mouth 2 (two) times daily as needed for anxiety.   lamoTRIgine  100 MG tablet Commonly known as: LAMICTAL  TAKE 1 TABLET(100 MG) BY MOUTH DAILY   meloxicam 7.5 MG tablet Commonly known as: MOBIC Take 1 tablet PO once a day PRN   metFORMIN  500 MG tablet Commonly known as: GLUCOPHAGE  Take 1 tablet (500 mg total) by mouth daily.   nitrofurantoin  (macrocrystal-monohydrate) 100 MG capsule Commonly known as: MACROBID  Take 1 capsule (100 mg total) by mouth 2 (two) times daily.   nitrofurantoin  (macrocrystal-monohydrate) 100 MG capsule Commonly known as: MACROBID  Take 1 capsule (100 mg total) by mouth 2 (two) times daily.   ondansetron  4 MG tablet Commonly known as: Zofran  Take 1 tablet (4 mg total) by mouth every 8 (eight)  hours as needed for nausea or vomiting.        Allergies:  Allergies  Allergen Reactions   Penicillins Nausea And Vomiting and Rash    Family History: Family History  Problem Relation Age of Onset   Diabetes Other    Heart disease Mother    Diabetes Maternal Aunt    Cancer Paternal Aunt        lymph node, breast, and ovarian   Breast cancer Paternal Aunt    Diabetes Maternal Grandmother    Heart disease Maternal Grandmother    Heart failure Maternal Grandmother    Heart attack Maternal Grandfather    Cancer Paternal Grandmother        breast cancer   Breast cancer Paternal Grandmother    Cancer Paternal Grandfather         lung cancer    Social History:  reports that she has been smoking cigarettes. She started smoking about 19 years ago. She has a 2.5 pack-year smoking history. She has never used smokeless tobacco. She reports that she does not drink alcohol and does not use drugs.  ROS: All other review of systems were reviewed and are negative except what is noted above in HPI  Physical Exam: BP (!) 156/86   Pulse (!) 108   LMP 07/24/2022   Constitutional:  Alert and oriented, No acute distress. HEENT: [place] AT, moist mucus membranes.  Trachea midline, no masses. Cardiovascular: No clubbing, cyanosis, or edema. Respiratory: Normal respiratory effort, no increased work of breathing. GI: Abdomen is soft, nontender, nondistended, no abdominal masses GU: No CVA tenderness.  Lymph: No cervical or inguinal lymphadenopathy. Skin: No rashes, bruises or suspicious lesions. Neurologic: Grossly intact, no focal deficits, moving all 4 extremities. Psychiatric: Normal mood and affect.  Laboratory Data: Lab Results  Component Value Date   WBC 15.9 (H) 08/04/2023   HGB 14.5 08/04/2023   HCT 42.7 08/04/2023   MCV 82 08/04/2023   PLT 536 (H) 08/04/2023    Lab Results  Component Value Date   CREATININE 0.70 08/04/2023    No results found for: PSA  No results found for: TESTOSTERONE  Lab Results  Component Value Date   HGBA1C 6.4 (H) 08/04/2023    Urinalysis    Component Value Date/Time   COLORURINE YELLOW 08/20/2014 0727   APPEARANCEUR Yellow 06/23/2024 1450   LABSPEC 1.020 08/20/2014 0727   PHURINE 7.0 08/20/2014 0727   GLUCOSEU Negative 06/23/2024 1450   HGBUR NEGATIVE 08/20/2014 0727   BILIRUBINUR Negative 06/23/2024 1450   KETONESUR moderate (40) (A) 06/13/2024 1500   KETONESUR TRACE (A) 08/20/2014 0727   PROTEINUR Negative 06/23/2024 1450   PROTEINUR NEGATIVE 08/20/2014 0727   UROBILINOGEN 4.0 (A) 06/13/2024 1500   UROBILINOGEN 0.2 08/20/2014 0727   NITRITE Negative  06/23/2024 1450   NITRITE NEGATIVE 08/20/2014 0727   LEUKOCYTESUR Negative 06/23/2024 1450    Lab Results  Component Value Date   BACTERIA FEW (A) 03/14/2014    Pertinent Imaging: Renal US  07/13/24: Images reviewed and discussed with the patient  No results found for this or any previous visit.  No results found for this or any previous visit.  No results found for this or any previous visit.  No results found for this or any previous visit.  Results for orders placed during the hospital encounter of 07/13/24  US  Renal  Narrative CLINICAL DATA:  Left flank pain x3 months.  EXAM: RENAL / URINARY TRACT ULTRASOUND COMPLETE  COMPARISON:  January 27, 2011  FINDINGS: Right Kidney:  Renal measurements: 11.5 cm x 4.9 cm x 6.6 cm = volume: 193.8 mL. Echogenicity within normal limits. No mass or hydronephrosis visualized.  Left Kidney:  Renal measurements: 12.5 cm x 5.6 cm x 4.9 cm = volume: 177.2 mL. Echogenicity within normal limits. No mass is visualized. Mild fullness of the left renal pelvis is noted.  Bladder:  Appears normal for degree of bladder distention. The bilateral ureteral jets are visualized.  Other:  None.  IMPRESSION: Mild fullness of the left renal pelvis.   Electronically Signed By: Suzen Dials M.D. On: 07/18/2024 00:18  No results found for this or any previous visit.  No results found for this or any previous visit.  No results found for this or any previous visit.   Assessment & Plan:    1. Urinary frequency (Primary) -resolved - Urinalysis, Routine w reflex microscopic - BLADDER SCAN AMB NON-IMAGING  2. Nephrolithiasis -STAT CT stone study   No follow-ups on file.  Belvie Clara, MD  [facility]

## 2024-11-30 NOTE — Patient Instructions (Signed)

## 2024-12-02 ENCOUNTER — Telehealth: Payer: Self-pay

## 2024-12-02 NOTE — Telephone Encounter (Signed)
 Return call to pt about authorization for CT stone study. Pt state's she did not no show her appointment and she was told that her CT stone study was not approved, she had to wait for insurance approval. Pt is aware a message will be sent to [facility] to follow up on authorization for CT stone study. Pt voiced understanding

## 2024-12-05 ENCOUNTER — Telehealth: Payer: Self-pay

## 2024-12-05 NOTE — Telephone Encounter (Signed)
 Spoke with patient, she scheduled imaging.

## 2024-12-05 NOTE — Telephone Encounter (Signed)
 Kourtney,  Please call the patient. It looks like her CT has been approved and is scheduled.  Thanks

## 2024-12-05 NOTE — Telephone Encounter (Signed)
 CT authorized, called to inform patient CT will be scheduled.  No answer, unable to leave vm at this time due to her mailbox being full. Will schedule CT and send pt a mychart message

## 2024-12-08 ENCOUNTER — Ambulatory Visit (HOSPITAL_COMMUNITY)
Admission: RE | Admit: 2024-12-08 | Discharge: 2024-12-08 | Disposition: A | Discharge status: Home / Self Care | Source: Ambulatory Visit | Attending: Urology | Admitting: Urology

## 2024-12-08 DIAGNOSIS — N200 Calculus of kidney: Secondary | ICD-10-CM | POA: Insufficient documentation

## 2024-12-08 DIAGNOSIS — N209 Urinary calculus, unspecified: Secondary | ICD-10-CM | POA: Diagnosis not present

## 2024-12-08 DIAGNOSIS — K429 Umbilical hernia without obstruction or gangrene: Secondary | ICD-10-CM | POA: Diagnosis not present

## 2024-12-08 DIAGNOSIS — K5730 Diverticulosis of large intestine without perforation or abscess without bleeding: Secondary | ICD-10-CM | POA: Diagnosis not present

## 2024-12-09 ENCOUNTER — Telehealth: Payer: Self-pay

## 2024-12-09 NOTE — Telephone Encounter (Signed)
 Patient letting office know she had the CT stone study done.  Please advise.  Call:  [phone]

## 2024-12-09 NOTE — Telephone Encounter (Signed)
 Please review imaging.

## 2024-12-12 ENCOUNTER — Encounter: Payer: Self-pay | Admitting: Urology

## 2024-12-12 NOTE — Telephone Encounter (Signed)
 Please review CT stone study results.

## 2025-03-03 ENCOUNTER — Other Ambulatory Visit: Admitting: Urology
